# Patient Record
Sex: Female | Born: 1970
Health system: Southern US, Community
[De-identification: ages and names within clinical notes are randomized; demographics above are authoritative.]

## PROBLEM LIST (undated history)

## (undated) DIAGNOSIS — K219 Gastro-esophageal reflux disease without esophagitis: Secondary | ICD-10-CM

## (undated) DIAGNOSIS — R109 Unspecified abdominal pain: Secondary | ICD-10-CM

## (undated) DIAGNOSIS — I10 Essential (primary) hypertension: Secondary | ICD-10-CM

## (undated) DIAGNOSIS — G8929 Other chronic pain: Secondary | ICD-10-CM

## (undated) DIAGNOSIS — J45909 Unspecified asthma, uncomplicated: Secondary | ICD-10-CM

## (undated) DIAGNOSIS — G4700 Insomnia, unspecified: Secondary | ICD-10-CM

## (undated) DIAGNOSIS — E079 Disorder of thyroid, unspecified: Secondary | ICD-10-CM

## (undated) DIAGNOSIS — D649 Anemia, unspecified: Secondary | ICD-10-CM

## (undated) HISTORY — DX: Anemia, unspecified: D64.9

## (undated) HISTORY — PX: FOOT SURGERY: SHX648

## (undated) HISTORY — PX: TUBAL LIGATION: SHX77

## (undated) HISTORY — PX: GASTRIC BYPASS: SHX52

## (undated) HISTORY — PX: DILATION AND CURETTAGE OF UTERUS: SHX78

## (undated) HISTORY — PX: ANKLE SURGERY: SHX546

## (undated) HISTORY — DX: Insomnia, unspecified: G47.00

## (undated) HISTORY — DX: Gastro-esophageal reflux disease without esophagitis: K21.9

---

## 1998-01-15 ENCOUNTER — Other Ambulatory Visit: Admission: RE | Admit: 1998-01-15 | Discharge: 1998-01-15 | Payer: Self-pay | Admitting: Obstetrics

## 1998-06-30 ENCOUNTER — Emergency Department (HOSPITAL_COMMUNITY): Admission: EM | Admit: 1998-06-30 | Discharge: 1998-06-30 | Payer: Self-pay | Admitting: Emergency Medicine

## 1999-07-15 ENCOUNTER — Inpatient Hospital Stay (HOSPITAL_COMMUNITY): Admission: AD | Admit: 1999-07-15 | Discharge: 1999-07-15 | Payer: Self-pay | Admitting: *Deleted

## 1999-07-17 ENCOUNTER — Other Ambulatory Visit: Admission: RE | Admit: 1999-07-17 | Discharge: 1999-07-17 | Payer: Self-pay | Admitting: Obstetrics and Gynecology

## 1999-08-08 ENCOUNTER — Inpatient Hospital Stay (HOSPITAL_COMMUNITY): Admission: AD | Admit: 1999-08-08 | Discharge: 1999-08-08 | Payer: Self-pay | Admitting: Obstetrics and Gynecology

## 1999-09-13 ENCOUNTER — Inpatient Hospital Stay (HOSPITAL_COMMUNITY): Admission: AD | Admit: 1999-09-13 | Discharge: 1999-09-13 | Payer: Self-pay | Admitting: Obstetrics & Gynecology

## 1999-09-13 ENCOUNTER — Emergency Department (HOSPITAL_COMMUNITY): Admission: EM | Admit: 1999-09-13 | Discharge: 1999-09-13 | Payer: Self-pay | Admitting: Emergency Medicine

## 1999-09-14 ENCOUNTER — Encounter: Payer: Self-pay | Admitting: Emergency Medicine

## 1999-12-23 ENCOUNTER — Inpatient Hospital Stay (HOSPITAL_COMMUNITY): Admission: AD | Admit: 1999-12-23 | Discharge: 1999-12-23 | Payer: Self-pay | Admitting: Obstetrics and Gynecology

## 1999-12-24 ENCOUNTER — Inpatient Hospital Stay (HOSPITAL_COMMUNITY): Admission: AD | Admit: 1999-12-24 | Discharge: 1999-12-24 | Payer: Self-pay | Admitting: Obstetrics and Gynecology

## 2000-02-14 ENCOUNTER — Emergency Department (HOSPITAL_COMMUNITY): Admission: EM | Admit: 2000-02-14 | Discharge: 2000-02-14 | Payer: Self-pay | Admitting: Emergency Medicine

## 2000-02-28 ENCOUNTER — Inpatient Hospital Stay (HOSPITAL_COMMUNITY): Admission: AD | Admit: 2000-02-28 | Discharge: 2000-02-28 | Payer: Self-pay | Admitting: Obstetrics and Gynecology

## 2000-02-29 ENCOUNTER — Inpatient Hospital Stay (HOSPITAL_COMMUNITY): Admission: AD | Admit: 2000-02-29 | Discharge: 2000-03-02 | Payer: Self-pay | Admitting: Obstetrics and Gynecology

## 2000-07-23 ENCOUNTER — Emergency Department (HOSPITAL_COMMUNITY): Admission: EM | Admit: 2000-07-23 | Discharge: 2000-07-23 | Payer: Self-pay | Admitting: Emergency Medicine

## 2000-11-13 ENCOUNTER — Inpatient Hospital Stay (HOSPITAL_COMMUNITY): Admission: AD | Admit: 2000-11-13 | Discharge: 2000-11-13 | Payer: Self-pay | Admitting: Obstetrics and Gynecology

## 2000-11-19 ENCOUNTER — Encounter: Payer: Self-pay | Admitting: Obstetrics and Gynecology

## 2000-11-19 ENCOUNTER — Observation Stay (HOSPITAL_COMMUNITY): Admission: AD | Admit: 2000-11-19 | Discharge: 2000-11-20 | Payer: Self-pay | Admitting: Obstetrics and Gynecology

## 2001-01-02 ENCOUNTER — Inpatient Hospital Stay (HOSPITAL_COMMUNITY): Admission: AD | Admit: 2001-01-02 | Discharge: 2001-01-02 | Payer: Self-pay | Admitting: Obstetrics and Gynecology

## 2001-01-07 ENCOUNTER — Inpatient Hospital Stay (HOSPITAL_COMMUNITY): Admission: AD | Admit: 2001-01-07 | Discharge: 2001-01-07 | Payer: Self-pay | Admitting: Obstetrics and Gynecology

## 2001-01-10 ENCOUNTER — Inpatient Hospital Stay (HOSPITAL_COMMUNITY): Admission: AD | Admit: 2001-01-10 | Discharge: 2001-01-10 | Payer: Self-pay | Admitting: Obstetrics & Gynecology

## 2001-01-30 ENCOUNTER — Inpatient Hospital Stay (HOSPITAL_COMMUNITY): Admission: AD | Admit: 2001-01-30 | Discharge: 2001-01-30 | Payer: Self-pay | Admitting: Obstetrics and Gynecology

## 2001-02-19 ENCOUNTER — Inpatient Hospital Stay (HOSPITAL_COMMUNITY): Admission: AD | Admit: 2001-02-19 | Discharge: 2001-02-19 | Payer: Self-pay | Admitting: Obstetrics & Gynecology

## 2001-02-21 ENCOUNTER — Inpatient Hospital Stay (HOSPITAL_COMMUNITY): Admission: AD | Admit: 2001-02-21 | Discharge: 2001-02-21 | Payer: Self-pay | Admitting: Obstetrics and Gynecology

## 2001-02-28 ENCOUNTER — Encounter (HOSPITAL_COMMUNITY): Admission: RE | Admit: 2001-02-28 | Discharge: 2001-03-10 | Payer: Self-pay | Admitting: Obstetrics and Gynecology

## 2001-03-01 ENCOUNTER — Encounter: Payer: Self-pay | Admitting: Obstetrics and Gynecology

## 2001-03-03 ENCOUNTER — Inpatient Hospital Stay (HOSPITAL_COMMUNITY): Admission: AD | Admit: 2001-03-03 | Discharge: 2001-03-03 | Payer: Self-pay | Admitting: Obstetrics and Gynecology

## 2001-03-04 ENCOUNTER — Inpatient Hospital Stay (HOSPITAL_COMMUNITY): Admission: AD | Admit: 2001-03-04 | Discharge: 2001-03-04 | Payer: Self-pay | Admitting: Obstetrics and Gynecology

## 2001-03-06 ENCOUNTER — Inpatient Hospital Stay (HOSPITAL_COMMUNITY): Admission: AD | Admit: 2001-03-06 | Discharge: 2001-03-06 | Payer: Self-pay | Admitting: Obstetrics & Gynecology

## 2001-03-09 ENCOUNTER — Inpatient Hospital Stay (HOSPITAL_COMMUNITY): Admission: AD | Admit: 2001-03-09 | Discharge: 2001-03-11 | Payer: Self-pay | Admitting: Obstetrics and Gynecology

## 2001-04-06 ENCOUNTER — Other Ambulatory Visit: Admission: RE | Admit: 2001-04-06 | Discharge: 2001-04-06 | Payer: Self-pay | Admitting: Obstetrics and Gynecology

## 2001-04-20 ENCOUNTER — Ambulatory Visit (HOSPITAL_COMMUNITY): Admission: RE | Admit: 2001-04-20 | Discharge: 2001-04-27 | Payer: Self-pay | Admitting: Obstetrics and Gynecology

## 2001-06-09 ENCOUNTER — Ambulatory Visit (HOSPITAL_COMMUNITY): Admission: RE | Admit: 2001-06-09 | Discharge: 2001-06-09 | Payer: Self-pay | Admitting: *Deleted

## 2001-06-09 ENCOUNTER — Encounter: Payer: Self-pay | Admitting: *Deleted

## 2001-06-30 ENCOUNTER — Other Ambulatory Visit: Admission: RE | Admit: 2001-06-30 | Discharge: 2001-06-30 | Payer: Self-pay | Admitting: Obstetrics and Gynecology

## 2001-10-01 ENCOUNTER — Emergency Department (HOSPITAL_COMMUNITY): Admission: EM | Admit: 2001-10-01 | Discharge: 2001-10-01 | Payer: Self-pay | Admitting: Emergency Medicine

## 2001-10-02 ENCOUNTER — Ambulatory Visit (HOSPITAL_COMMUNITY): Admission: RE | Admit: 2001-10-02 | Discharge: 2001-10-02 | Payer: Self-pay | Admitting: Emergency Medicine

## 2001-10-02 ENCOUNTER — Encounter: Payer: Self-pay | Admitting: Emergency Medicine

## 2001-10-24 ENCOUNTER — Emergency Department (HOSPITAL_COMMUNITY): Admission: EM | Admit: 2001-10-24 | Discharge: 2001-10-24 | Payer: Self-pay | Admitting: Emergency Medicine

## 2001-10-26 ENCOUNTER — Encounter: Admission: RE | Admit: 2001-10-26 | Discharge: 2001-10-26 | Payer: Self-pay | Admitting: Obstetrics and Gynecology

## 2001-10-26 ENCOUNTER — Encounter: Payer: Self-pay | Admitting: Obstetrics and Gynecology

## 2001-12-29 ENCOUNTER — Encounter: Admission: RE | Admit: 2001-12-29 | Discharge: 2002-03-29 | Payer: Self-pay | Admitting: Internal Medicine

## 2002-09-02 ENCOUNTER — Encounter: Payer: Self-pay | Admitting: Emergency Medicine

## 2002-09-02 ENCOUNTER — Inpatient Hospital Stay (HOSPITAL_COMMUNITY): Admission: EM | Admit: 2002-09-02 | Discharge: 2002-09-03 | Payer: Self-pay | Admitting: Emergency Medicine

## 2002-09-02 ENCOUNTER — Encounter: Payer: Self-pay | Admitting: Orthopedic Surgery

## 2002-09-04 ENCOUNTER — Encounter: Payer: Self-pay | Admitting: Emergency Medicine

## 2002-09-04 ENCOUNTER — Emergency Department (HOSPITAL_COMMUNITY): Admission: EM | Admit: 2002-09-04 | Discharge: 2002-09-04 | Payer: Self-pay | Admitting: Emergency Medicine

## 2002-09-06 ENCOUNTER — Ambulatory Visit (HOSPITAL_COMMUNITY): Admission: RE | Admit: 2002-09-06 | Discharge: 2002-09-06 | Payer: Self-pay | Admitting: Anesthesiology

## 2003-03-02 ENCOUNTER — Emergency Department (HOSPITAL_COMMUNITY): Admission: EM | Admit: 2003-03-02 | Discharge: 2003-03-03 | Payer: Self-pay

## 2003-07-30 ENCOUNTER — Emergency Department (HOSPITAL_COMMUNITY): Admission: EM | Admit: 2003-07-30 | Discharge: 2003-07-31 | Payer: Self-pay | Admitting: Emergency Medicine

## 2003-11-01 ENCOUNTER — Emergency Department (HOSPITAL_COMMUNITY): Admission: EM | Admit: 2003-11-01 | Discharge: 2003-11-01 | Payer: Self-pay | Admitting: Emergency Medicine

## 2004-07-25 ENCOUNTER — Emergency Department (HOSPITAL_COMMUNITY): Admission: EM | Admit: 2004-07-25 | Discharge: 2004-07-25 | Payer: Self-pay

## 2004-08-01 ENCOUNTER — Emergency Department (HOSPITAL_COMMUNITY): Admission: EM | Admit: 2004-08-01 | Discharge: 2004-08-01 | Payer: Self-pay | Admitting: Emergency Medicine

## 2004-10-11 ENCOUNTER — Inpatient Hospital Stay (HOSPITAL_COMMUNITY): Admission: AD | Admit: 2004-10-11 | Discharge: 2004-10-11 | Payer: Self-pay | Admitting: Obstetrics

## 2004-10-13 ENCOUNTER — Ambulatory Visit (HOSPITAL_COMMUNITY): Admission: RE | Admit: 2004-10-13 | Discharge: 2004-10-13 | Payer: Self-pay | Admitting: Obstetrics

## 2004-10-13 ENCOUNTER — Encounter (INDEPENDENT_AMBULATORY_CARE_PROVIDER_SITE_OTHER): Payer: Self-pay | Admitting: *Deleted

## 2004-11-15 ENCOUNTER — Inpatient Hospital Stay (HOSPITAL_COMMUNITY): Admission: AD | Admit: 2004-11-15 | Discharge: 2004-11-15 | Payer: Self-pay | Admitting: Obstetrics

## 2005-01-23 ENCOUNTER — Emergency Department (HOSPITAL_COMMUNITY): Admission: EM | Admit: 2005-01-23 | Discharge: 2005-01-23 | Payer: Self-pay | Admitting: Emergency Medicine

## 2005-03-29 ENCOUNTER — Inpatient Hospital Stay (HOSPITAL_COMMUNITY): Admission: AD | Admit: 2005-03-29 | Discharge: 2005-03-29 | Payer: Self-pay | Admitting: Obstetrics

## 2005-04-23 ENCOUNTER — Inpatient Hospital Stay (HOSPITAL_COMMUNITY): Admission: AD | Admit: 2005-04-23 | Discharge: 2005-04-23 | Payer: Self-pay | Admitting: Obstetrics & Gynecology

## 2005-05-19 ENCOUNTER — Inpatient Hospital Stay (HOSPITAL_COMMUNITY): Admission: AD | Admit: 2005-05-19 | Discharge: 2005-05-19 | Payer: Self-pay | Admitting: Obstetrics

## 2005-08-07 ENCOUNTER — Inpatient Hospital Stay (HOSPITAL_COMMUNITY): Admission: AD | Admit: 2005-08-07 | Discharge: 2005-08-08 | Payer: Self-pay | Admitting: Obstetrics

## 2005-09-18 ENCOUNTER — Inpatient Hospital Stay (HOSPITAL_COMMUNITY): Admission: AD | Admit: 2005-09-18 | Discharge: 2005-09-19 | Payer: Self-pay | Admitting: Obstetrics & Gynecology

## 2005-10-29 ENCOUNTER — Inpatient Hospital Stay (HOSPITAL_COMMUNITY): Admission: AD | Admit: 2005-10-29 | Discharge: 2005-10-29 | Payer: Self-pay | Admitting: Obstetrics

## 2005-11-19 ENCOUNTER — Inpatient Hospital Stay (HOSPITAL_COMMUNITY): Admission: RE | Admit: 2005-11-19 | Discharge: 2005-11-23 | Payer: Self-pay | Admitting: Obstetrics & Gynecology

## 2005-11-27 ENCOUNTER — Inpatient Hospital Stay (HOSPITAL_COMMUNITY): Admission: AD | Admit: 2005-11-27 | Discharge: 2005-12-01 | Payer: Self-pay | Admitting: Obstetrics & Gynecology

## 2006-05-12 ENCOUNTER — Ambulatory Visit (HOSPITAL_COMMUNITY): Admission: RE | Admit: 2006-05-12 | Discharge: 2006-05-12 | Payer: Self-pay | Admitting: Obstetrics & Gynecology

## 2006-09-03 ENCOUNTER — Emergency Department (HOSPITAL_COMMUNITY): Admission: EM | Admit: 2006-09-03 | Discharge: 2006-09-03 | Payer: Self-pay | Admitting: Emergency Medicine

## 2006-09-17 ENCOUNTER — Emergency Department (HOSPITAL_COMMUNITY): Admission: EM | Admit: 2006-09-17 | Discharge: 2006-09-17 | Payer: Self-pay | Admitting: Family Medicine

## 2007-03-22 ENCOUNTER — Observation Stay (HOSPITAL_COMMUNITY): Admission: AD | Admit: 2007-03-22 | Discharge: 2007-03-22 | Payer: Self-pay | Admitting: Obstetrics & Gynecology

## 2007-05-08 ENCOUNTER — Inpatient Hospital Stay (HOSPITAL_COMMUNITY): Admission: AD | Admit: 2007-05-08 | Discharge: 2007-05-08 | Payer: Self-pay | Admitting: Obstetrics

## 2007-06-05 ENCOUNTER — Inpatient Hospital Stay (HOSPITAL_COMMUNITY): Admission: AD | Admit: 2007-06-05 | Discharge: 2007-06-05 | Payer: Self-pay | Admitting: Obstetrics

## 2007-07-07 ENCOUNTER — Inpatient Hospital Stay (HOSPITAL_COMMUNITY): Admission: AD | Admit: 2007-07-07 | Discharge: 2007-07-07 | Payer: Self-pay | Admitting: Obstetrics

## 2007-07-08 ENCOUNTER — Inpatient Hospital Stay (HOSPITAL_COMMUNITY): Admission: AD | Admit: 2007-07-08 | Discharge: 2007-07-08 | Payer: Self-pay | Admitting: Obstetrics

## 2007-08-14 ENCOUNTER — Inpatient Hospital Stay (HOSPITAL_COMMUNITY): Admission: AD | Admit: 2007-08-14 | Discharge: 2007-08-14 | Payer: Self-pay | Admitting: Obstetrics

## 2007-08-17 ENCOUNTER — Ambulatory Visit (HOSPITAL_COMMUNITY): Admission: RE | Admit: 2007-08-17 | Discharge: 2007-08-17 | Payer: Self-pay | Admitting: Obstetrics & Gynecology

## 2007-08-17 ENCOUNTER — Other Ambulatory Visit: Payer: Self-pay | Admitting: Obstetrics & Gynecology

## 2007-08-18 ENCOUNTER — Encounter: Payer: Self-pay | Admitting: Obstetrics & Gynecology

## 2007-08-18 ENCOUNTER — Inpatient Hospital Stay (HOSPITAL_COMMUNITY): Admission: RE | Admit: 2007-08-18 | Discharge: 2007-08-21 | Payer: Self-pay | Admitting: Obstetrics & Gynecology

## 2007-12-20 ENCOUNTER — Encounter: Admission: RE | Admit: 2007-12-20 | Discharge: 2008-03-19 | Payer: Self-pay | Admitting: Obstetrics & Gynecology

## 2008-02-07 ENCOUNTER — Emergency Department (HOSPITAL_BASED_OUTPATIENT_CLINIC_OR_DEPARTMENT_OTHER): Admission: EM | Admit: 2008-02-07 | Discharge: 2008-02-07 | Payer: Self-pay | Admitting: Emergency Medicine

## 2008-08-21 ENCOUNTER — Emergency Department (HOSPITAL_BASED_OUTPATIENT_CLINIC_OR_DEPARTMENT_OTHER): Admission: EM | Admit: 2008-08-21 | Discharge: 2008-08-21 | Payer: Self-pay | Admitting: Emergency Medicine

## 2008-09-10 ENCOUNTER — Ambulatory Visit (HOSPITAL_COMMUNITY): Admission: RE | Admit: 2008-09-10 | Discharge: 2008-09-10 | Payer: Self-pay | Admitting: Obstetrics & Gynecology

## 2009-01-21 ENCOUNTER — Inpatient Hospital Stay (HOSPITAL_COMMUNITY): Admission: AD | Admit: 2009-01-21 | Discharge: 2009-01-21 | Payer: Self-pay | Admitting: Obstetrics

## 2009-01-21 ENCOUNTER — Ambulatory Visit: Payer: Self-pay | Admitting: Family

## 2009-02-26 ENCOUNTER — Ambulatory Visit (HOSPITAL_COMMUNITY): Admission: RE | Admit: 2009-02-26 | Discharge: 2009-02-26 | Payer: Self-pay | Admitting: Obstetrics & Gynecology

## 2009-03-05 ENCOUNTER — Ambulatory Visit (HOSPITAL_COMMUNITY): Admission: RE | Admit: 2009-03-05 | Discharge: 2009-03-05 | Payer: Self-pay | Admitting: Obstetrics & Gynecology

## 2009-03-12 ENCOUNTER — Ambulatory Visit (HOSPITAL_COMMUNITY): Admission: RE | Admit: 2009-03-12 | Discharge: 2009-03-12 | Payer: Self-pay | Admitting: Obstetrics & Gynecology

## 2009-03-17 ENCOUNTER — Inpatient Hospital Stay (HOSPITAL_COMMUNITY): Admission: AD | Admit: 2009-03-17 | Discharge: 2009-03-17 | Payer: Self-pay | Admitting: Obstetrics

## 2009-03-20 ENCOUNTER — Inpatient Hospital Stay (HOSPITAL_COMMUNITY): Admission: AD | Admit: 2009-03-20 | Discharge: 2009-03-22 | Payer: Self-pay | Admitting: Obstetrics

## 2009-03-21 ENCOUNTER — Encounter: Payer: Self-pay | Admitting: Obstetrics & Gynecology

## 2009-03-27 ENCOUNTER — Ambulatory Visit (HOSPITAL_COMMUNITY): Admission: RE | Admit: 2009-03-27 | Discharge: 2009-03-27 | Payer: Self-pay | Admitting: Obstetrics & Gynecology

## 2009-03-31 ENCOUNTER — Ambulatory Visit (HOSPITAL_COMMUNITY): Admission: RE | Admit: 2009-03-31 | Discharge: 2009-03-31 | Payer: Self-pay | Admitting: Obstetrics & Gynecology

## 2009-04-01 ENCOUNTER — Inpatient Hospital Stay (HOSPITAL_COMMUNITY): Admission: RE | Admit: 2009-04-01 | Discharge: 2009-04-04 | Payer: Self-pay | Admitting: Obstetrics & Gynecology

## 2009-04-01 ENCOUNTER — Encounter: Payer: Self-pay | Admitting: Obstetrics & Gynecology

## 2009-09-25 ENCOUNTER — Emergency Department (HOSPITAL_BASED_OUTPATIENT_CLINIC_OR_DEPARTMENT_OTHER): Admission: EM | Admit: 2009-09-25 | Discharge: 2009-09-25 | Payer: Self-pay | Admitting: Emergency Medicine

## 2009-09-30 ENCOUNTER — Emergency Department (HOSPITAL_BASED_OUTPATIENT_CLINIC_OR_DEPARTMENT_OTHER): Admission: EM | Admit: 2009-09-30 | Discharge: 2009-09-30 | Payer: Self-pay | Admitting: Emergency Medicine

## 2010-02-12 IMAGING — US US FETAL BPP W/O NONSTRESS
1 series · 7 of 7 positions shown · non-contrast
Comparison: none

OBSTETRICAL ULTRASOUND:
 This ultrasound exam was performed in the [HOSPITAL] Ultrasound Department.  The OB US report was generated in the AS system, and faxed to the ordering physician.  This report is also available in [REDACTED] PACS.

[Series 1: us fetal bpp w/o nonstress · non-contrast · 0.27mm/px · 7 acquisitions, 7 frames shown]
[im 1/7]
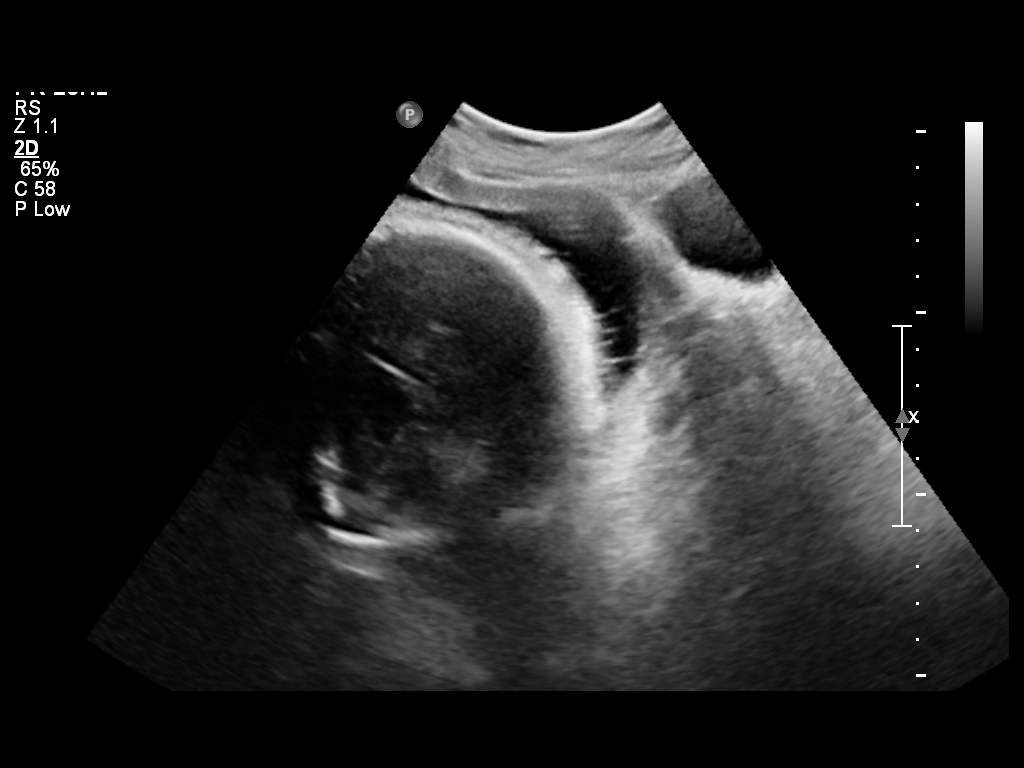
[im 2/7]
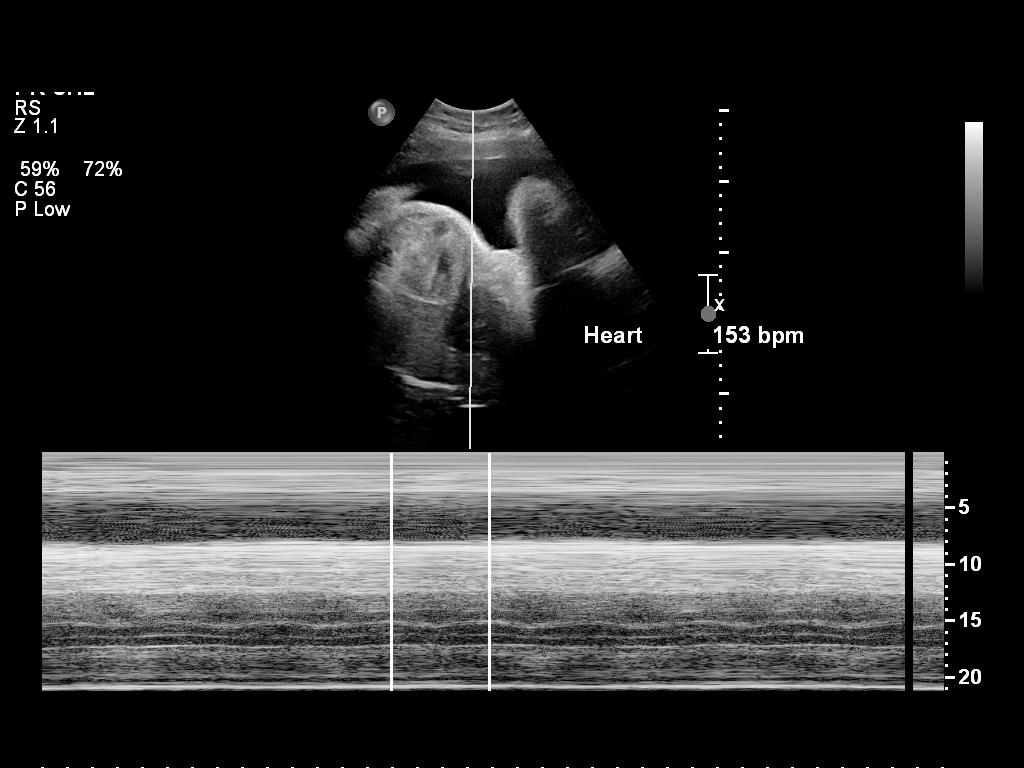
[im 3/7]
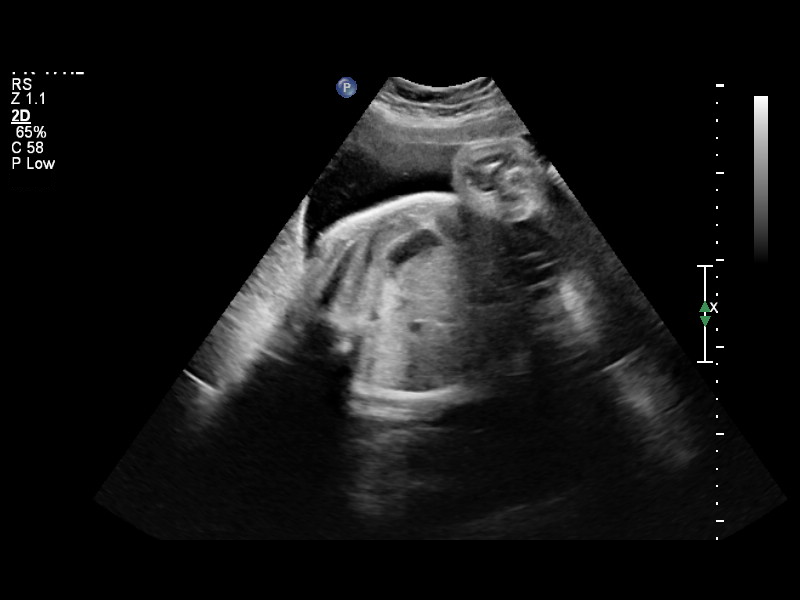
[im 4/7]
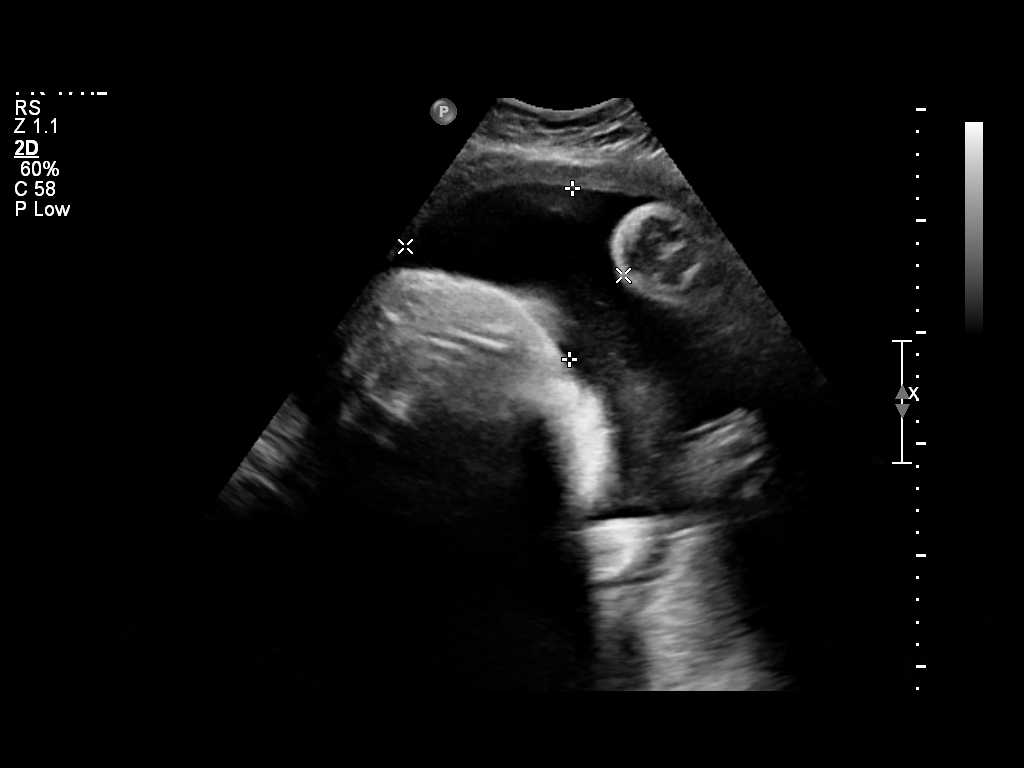
[im 5/7]
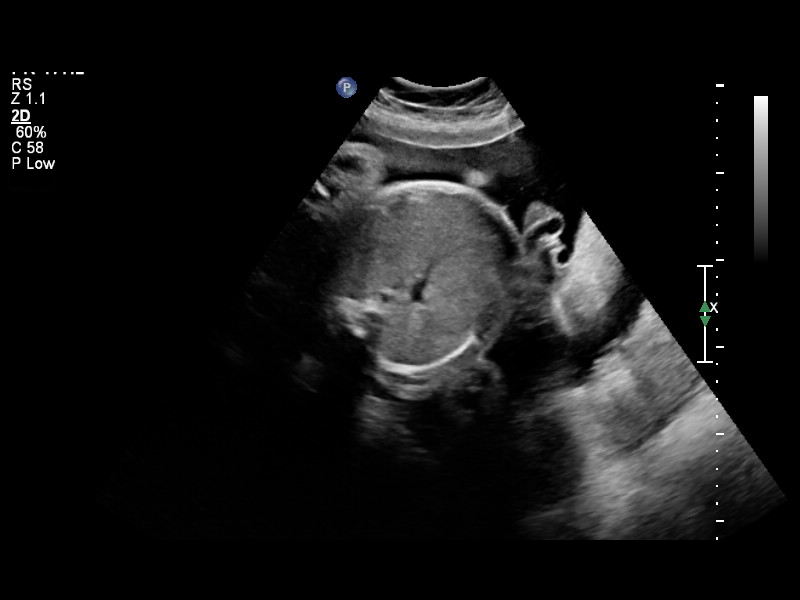
[im 6/7]
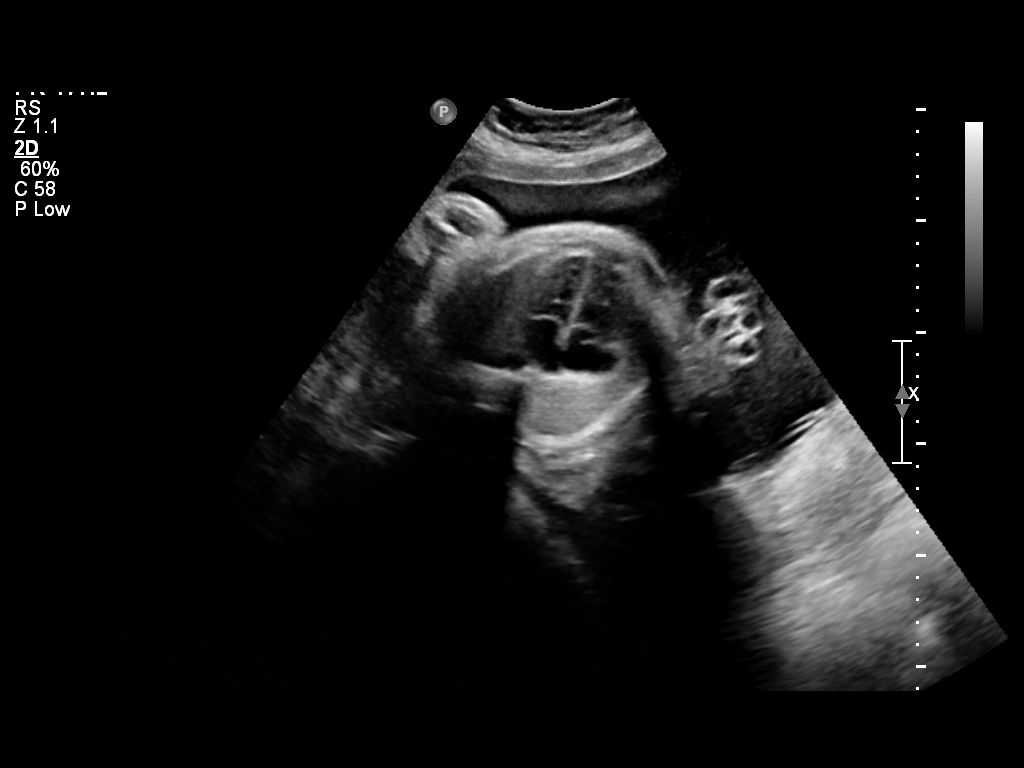
[im 7/7]
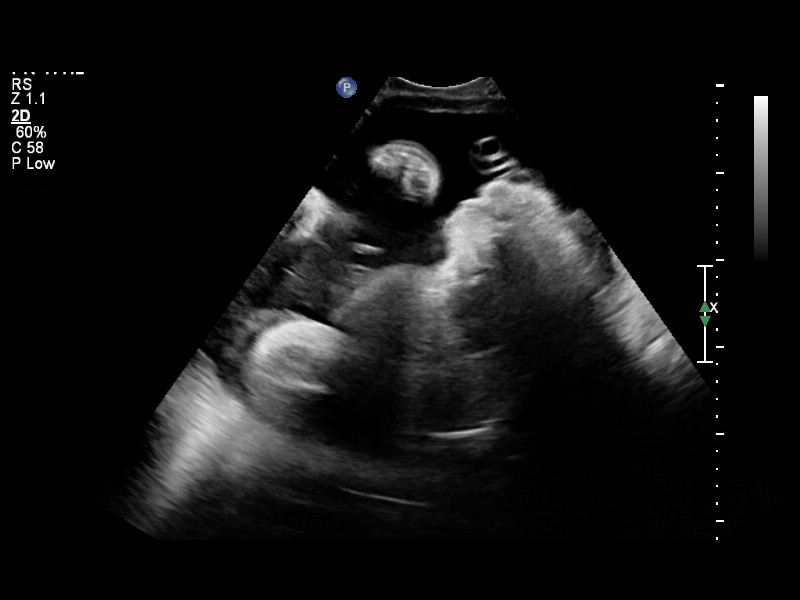

[7 of 7 positions shown; findings below may reference images not displayed]

IMPRESSION: See AS Obstetric US report.

## 2010-02-16 IMAGING — US US AMNIOCENTESIS
1 series · 4 of 4 positions shown · non-contrast
Comparison: none

CLINICAL DATA: ULTRASOUND-GUIDED AMNIOCENTESIS:
 Ultrasound was utilized to perform amniocentesis by the requesting physician.

[Series 1: us amniocentesis · 4 of 4 slices shown]
[im 1/4]
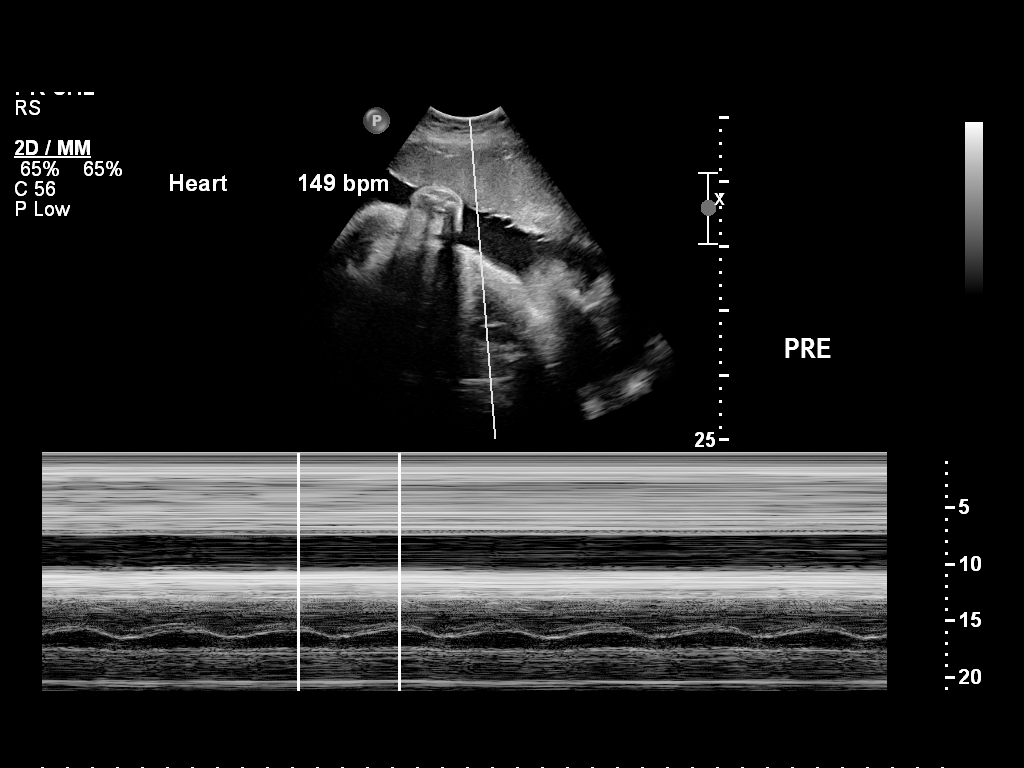
[im 2/4]
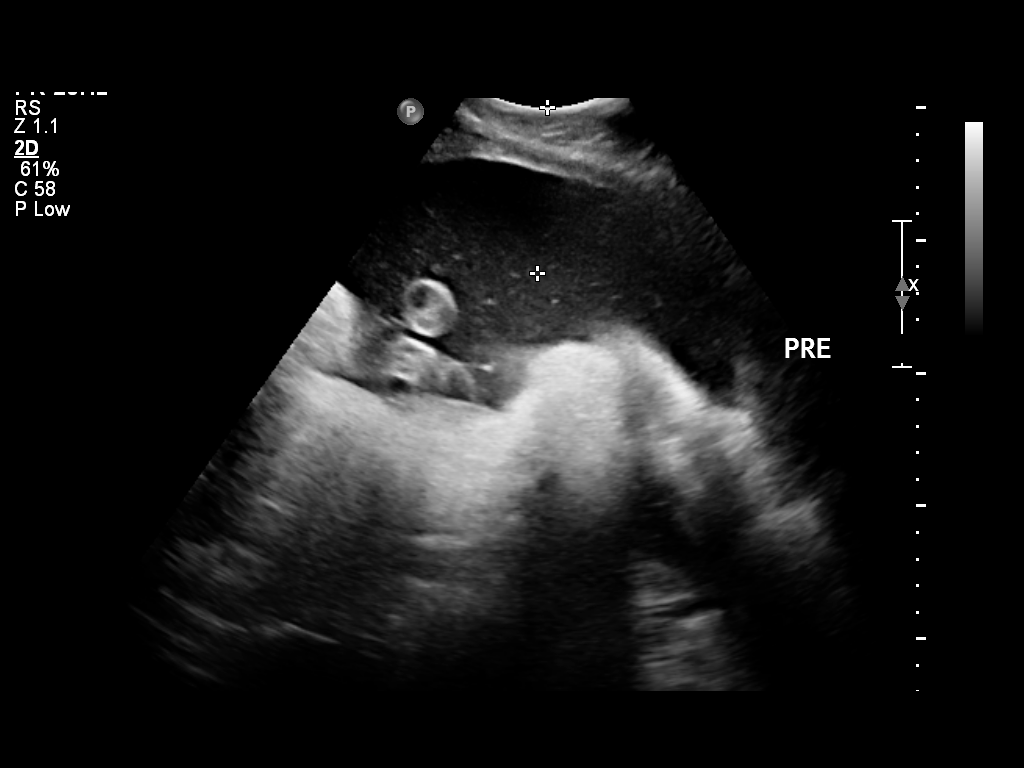
[im 3/4]
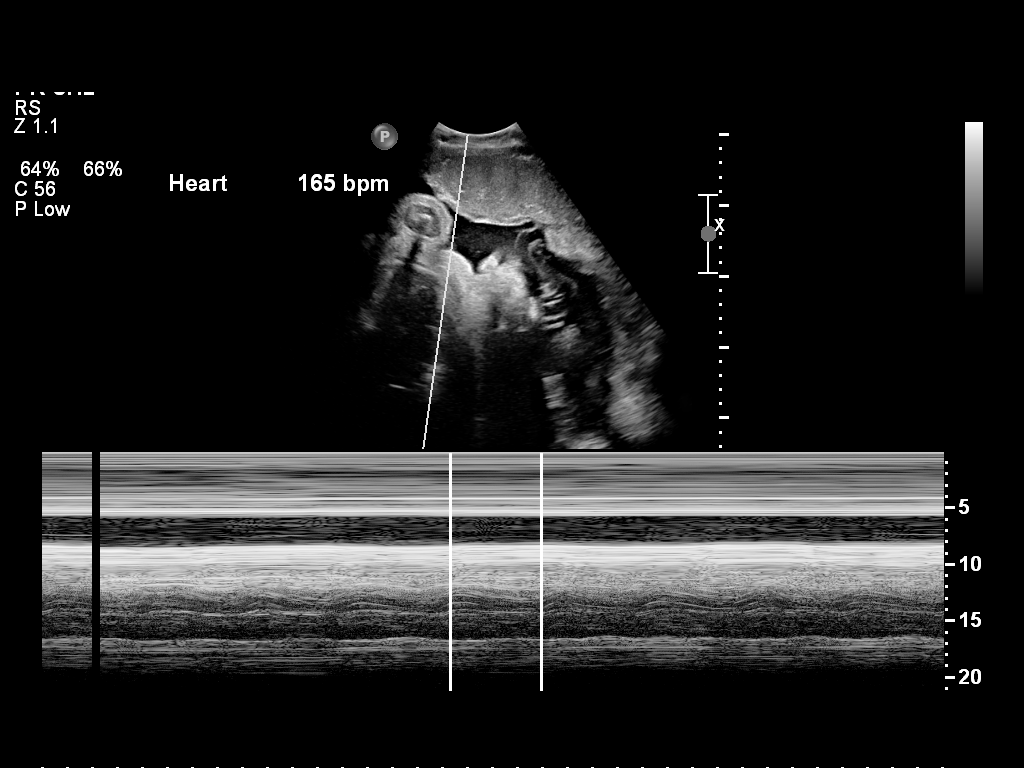
[im 4/4]
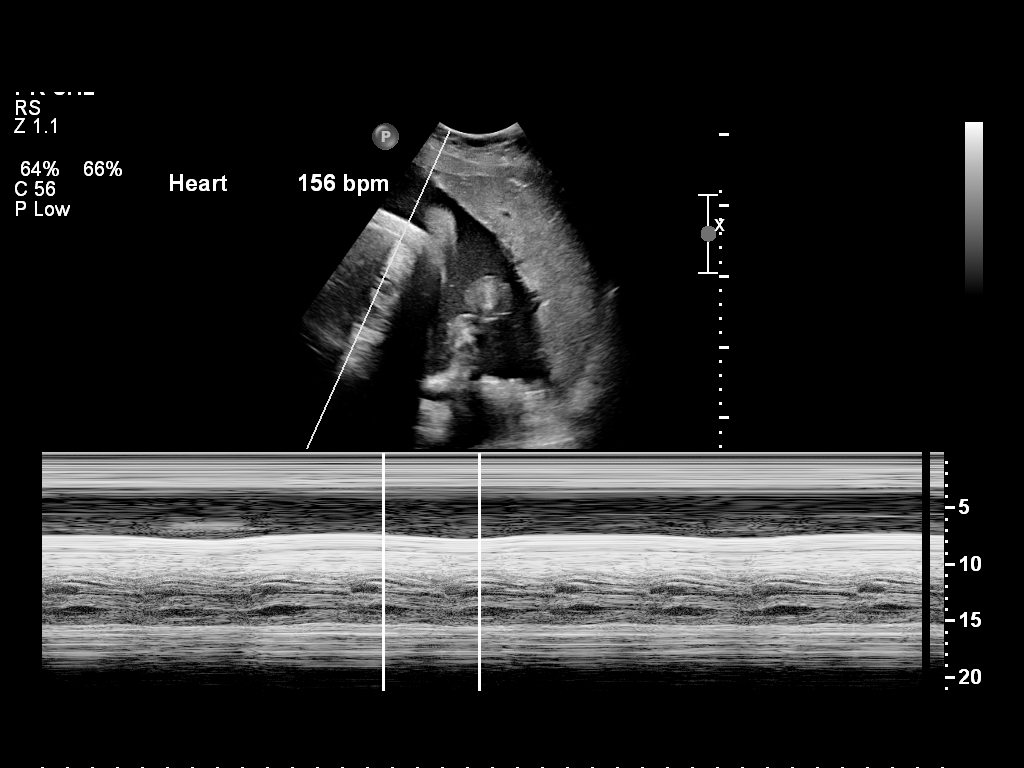

[4 of 4 positions shown; findings below may reference images not displayed]

IMPRESSION: No radiographic interpretation.

## 2010-06-20 ENCOUNTER — Emergency Department (HOSPITAL_BASED_OUTPATIENT_CLINIC_OR_DEPARTMENT_OTHER): Admission: EM | Admit: 2010-06-20 | Discharge: 2010-06-20 | Payer: Self-pay | Admitting: Emergency Medicine

## 2010-08-29 ENCOUNTER — Encounter: Payer: Self-pay | Admitting: Obstetrics & Gynecology

## 2010-08-29 ENCOUNTER — Encounter: Payer: Self-pay | Admitting: Obstetrics

## 2010-08-31 ENCOUNTER — Encounter: Payer: Self-pay | Admitting: Obstetrics

## 2010-10-20 LAB — CBC
HCT: 23.7 % — ABNORMAL LOW (ref 36.0–46.0)
Platelets: 256 10*3/uL (ref 150–400)
RBC: 3.29 MIL/uL — ABNORMAL LOW (ref 3.87–5.11)
RDW: 18.1 % — ABNORMAL HIGH (ref 11.5–15.5)
WBC: 7.5 10*3/uL (ref 4.0–10.5)

## 2010-10-20 LAB — DIFFERENTIAL
Basophils Absolute: 0 10*3/uL (ref 0.0–0.1)
Lymphocytes Relative: 53 % — ABNORMAL HIGH (ref 12–46)
Lymphs Abs: 3.9 10*3/uL (ref 0.7–4.0)
Neutro Abs: 2.7 10*3/uL (ref 1.7–7.7)

## 2010-10-20 LAB — BASIC METABOLIC PANEL
BUN: 16 mg/dL (ref 6–23)
GFR calc Af Amer: >60 mL/min (ref >60)
GFR calc non Af Amer: >60 mL/min (ref >60)
Potassium: 3.7 mEq/L (ref 3.5–5.1)

## 2010-10-20 LAB — D-DIMER, QUANTITATIVE: D-Dimer, Quant: 0.36 ug/mL-FEU (ref 0.00–0.48)

## 2010-11-01 ENCOUNTER — Emergency Department (HOSPITAL_BASED_OUTPATIENT_CLINIC_OR_DEPARTMENT_OTHER)
Admission: EM | Admit: 2010-11-01 | Discharge: 2010-11-01 | Disposition: A | Discharge status: Home / Self Care | Payer: BC Managed Care – PPO | Attending: Emergency Medicine | Admitting: Emergency Medicine

## 2010-11-01 DIAGNOSIS — Z79899 Other long term (current) drug therapy: Secondary | ICD-10-CM | POA: Insufficient documentation

## 2010-11-01 DIAGNOSIS — Y92009 Unspecified place in unspecified non-institutional (private) residence as the place of occurrence of the external cause: Secondary | ICD-10-CM | POA: Insufficient documentation

## 2010-11-01 DIAGNOSIS — J45909 Unspecified asthma, uncomplicated: Secondary | ICD-10-CM | POA: Insufficient documentation

## 2010-11-01 DIAGNOSIS — T1500XA Foreign body in cornea, unspecified eye, initial encounter: Secondary | ICD-10-CM | POA: Insufficient documentation

## 2010-11-01 DIAGNOSIS — I10 Essential (primary) hypertension: Secondary | ICD-10-CM | POA: Insufficient documentation

## 2010-11-01 DIAGNOSIS — IMO0002 Reserved for concepts with insufficient information to code with codable children: Secondary | ICD-10-CM | POA: Insufficient documentation

## 2010-11-01 DIAGNOSIS — H5710 Ocular pain, unspecified eye: Secondary | ICD-10-CM | POA: Insufficient documentation

## 2010-11-14 LAB — COMPREHENSIVE METABOLIC PANEL
ALT: 8 U/L (ref 0–35)
ALT: <8 U/L (ref 0–35)
AST: 19 U/L (ref 0–37)
AST: 16 U/L (ref 0–37)
Albumin: 2.2 g/dL — ABNORMAL LOW (ref 3.5–5.2)
Albumin: 2.1 g/dL — ABNORMAL LOW (ref 3.5–5.2)
Alkaline Phosphatase: 105 U/L (ref 39–117)
Alkaline Phosphatase: 102 U/L (ref 39–117)
BUN: 10 mg/dL (ref 6–23)
BUN: 10 mg/dL (ref 6–23)
CO2: 23 mEq/L (ref 19–32)
CO2: 23 mEq/L (ref 19–32)
Calcium: 8.7 mg/dL (ref 8.4–10.5)
Calcium: 8.4 mg/dL (ref 8.4–10.5)
Chloride: 106 mEq/L (ref 96–112)
Chloride: 105 mEq/L (ref 96–112)
Creatinine, Ser: 0.51 mg/dL (ref 0.4–1.2)
Creatinine, Ser: 0.54 mg/dL (ref 0.4–1.2)
GFR calc Af Amer: >60 mL/min (ref >60)
GFR calc Af Amer: >60 mL/min (ref >60)
GFR calc non Af Amer: >60 mL/min (ref >60)
GFR calc non Af Amer: >60 mL/min (ref >60)
Glucose, Bld: 111 mg/dL — ABNORMAL HIGH (ref 70–99)
Glucose, Bld: 95 mg/dL (ref 70–99)
Potassium: 3.6 mEq/L (ref 3.5–5.1)
Potassium: 3.9 mEq/L (ref 3.5–5.1)
Sodium: 136 mEq/L (ref 135–145)
Sodium: 133 mEq/L — ABNORMAL LOW (ref 135–145)
Total Bilirubin: 0.4 mg/dL (ref 0.3–1.2)
Total Bilirubin: 0.5 mg/dL (ref 0.3–1.2)
Total Protein: 6.4 g/dL (ref 6.0–8.3)
Total Protein: 5.9 g/dL — ABNORMAL LOW (ref 6.0–8.3)

## 2010-11-14 LAB — CBC
HCT: 21 % — ABNORMAL LOW (ref 36.0–46.0)
HCT: 26.3 % — ABNORMAL LOW (ref 36.0–46.0)
Hemoglobin: 7 g/dL — CL (ref 12.0–15.0)
Hemoglobin: 9.1 g/dL — ABNORMAL LOW (ref 12.0–15.0)
Hemoglobin: 8.7 g/dL — ABNORMAL LOW (ref 12.0–15.0)
MCHC: 33.5 g/dL (ref 30.0–36.0)
MCHC: 33 g/dL (ref 30.0–36.0)
MCV: 78.9 fL (ref 78.0–100.0)
MCV: 78.7 fL (ref 78.0–100.0)
Platelets: 179 10*3/uL (ref 150–400)
Platelets: 240 10*3/uL (ref 150–400)
RBC: 2.66 MIL/uL — ABNORMAL LOW (ref 3.87–5.11)
RBC: 3.43 MIL/uL — ABNORMAL LOW (ref 3.87–5.11)
RBC: 3.34 MIL/uL — ABNORMAL LOW (ref 3.87–5.11)
RDW: 15.7 % — ABNORMAL HIGH (ref 11.5–15.5)
RDW: 15.9 % — ABNORMAL HIGH (ref 11.5–15.5)
WBC: 7.1 10*3/uL (ref 4.0–10.5)
WBC: 6.2 10*3/uL (ref 4.0–10.5)

## 2010-11-14 LAB — GLUCOSE, CAPILLARY: Glucose-Capillary: 106 mg/dL — ABNORMAL HIGH (ref 70–99)

## 2010-11-14 LAB — LSPG (L/S RATIO WITH PG)-AMNIO FLUID

## 2010-11-14 LAB — URINALYSIS, ROUTINE W REFLEX MICROSCOPIC
Bilirubin Urine: NEGATIVE
Leukocytes, UA: NEGATIVE
Nitrite: NEGATIVE
Specific Gravity, Urine: 1.02 (ref 1.005–1.030)
pH: 6 (ref 5.0–8.0)

## 2010-11-14 LAB — LACTATE DEHYDROGENASE: LDH: 98 U/L (ref 94–250)

## 2010-11-14 LAB — URINE MICROSCOPIC-ADD ON

## 2010-11-14 LAB — RPR: RPR Ser Ql: NONREACTIVE

## 2010-11-14 LAB — URIC ACID: Uric Acid, Serum: 4.2 mg/dL (ref 2.4–7.0)

## 2010-11-15 LAB — CBC
MCHC: 33.7 g/dL (ref 30.0–36.0)
RDW: 15.7 % — ABNORMAL HIGH (ref 11.5–15.5)

## 2010-11-15 LAB — GLUCOSE, CAPILLARY
Glucose-Capillary: 102 mg/dL — ABNORMAL HIGH (ref 70–99)
Glucose-Capillary: 103 mg/dL — ABNORMAL HIGH (ref 70–99)
Glucose-Capillary: 114 mg/dL — ABNORMAL HIGH (ref 70–99)
Glucose-Capillary: 116 mg/dL — ABNORMAL HIGH (ref 70–99)
Glucose-Capillary: 121 mg/dL — ABNORMAL HIGH (ref 70–99)

## 2010-11-15 LAB — COMPREHENSIVE METABOLIC PANEL
ALT: <8 U/L (ref 0–35)
AST: 17 U/L (ref 0–37)
Albumin: 2.2 g/dL — ABNORMAL LOW (ref 3.5–5.2)
Alkaline Phosphatase: 97 U/L (ref 39–117)
BUN: 8 mg/dL (ref 6–23)
CO2: 20 mEq/L (ref 19–32)
Calcium: 8.4 mg/dL (ref 8.4–10.5)
Chloride: 106 mEq/L (ref 96–112)
Creatinine, Ser: 0.55 mg/dL (ref 0.4–1.2)
GFR calc Af Amer: >60 mL/min (ref >60)
GFR calc non Af Amer: >60 mL/min (ref >60)
Glucose, Bld: 139 mg/dL — ABNORMAL HIGH (ref 70–99)
Potassium: 3.8 mEq/L (ref 3.5–5.1)
Sodium: 134 mEq/L — ABNORMAL LOW (ref 135–145)
Total Bilirubin: 0.4 mg/dL (ref 0.3–1.2)
Total Protein: 6.1 g/dL (ref 6.0–8.3)

## 2010-11-15 LAB — PROTEIN, URINE, 24 HOUR
Collection Interval-UPROT: 24 hours
Protein, 24H Urine: 78 mg/d (ref 50–100)
Protein, Urine: 4 mg/dL

## 2010-11-15 LAB — CREATININE CLEARANCE, URINE, 24 HOUR: Creatinine Clearance: 257 mL/min — ABNORMAL HIGH (ref 75–115)

## 2010-11-15 LAB — URIC ACID: Uric Acid, Serum: 4.4 mg/dL (ref 2.4–7.0)

## 2010-11-15 LAB — TYPE AND SCREEN: Antibody Screen: NEGATIVE

## 2010-11-16 LAB — URINALYSIS, ROUTINE W REFLEX MICROSCOPIC
Ketones, ur: NEGATIVE mg/dL
Nitrite: NEGATIVE
Specific Gravity, Urine: 1.025 (ref 1.005–1.030)
pH: 6.5 (ref 5.0–8.0)

## 2010-11-16 LAB — FETAL FIBRONECTIN: Fetal Fibronectin: NEGATIVE

## 2010-12-22 NOTE — Op Note (Signed)
[name], [name] NO.:  [id number]   MEDICAL RECORD NO.:  [id number]          PATIENT TYPE:  INP   LOCATION:  9123                          FACILITY:  [facility]   PHYSICIAN:  Roseanna Rainbow, M.D.DATE OF BIRTH:  10-23-1970   DATE OF PROCEDURE:  08/18/2007  DATE OF DISCHARGE:                               OPERATIVE REPORT   PREOPERATIVE DIAGNOSES:  1. Intrauterine pregnancy at 36+ weeks.  2. Placenta previa.  3. Oligohydramnios.   POSTOPERATIVE DIAGNOSES:  1. Intrauterine pregnancy at 36+ weeks.  2. Placenta previa.  3. Oligohydramnios.   PROCEDURE:  Primary low uterine flap elliptical cesarean delivery via  Pfannenstiel skin incision.   SURGEON:  Cyd Silence.   ANESTHESIA:  Spinal.   PATHOLOGY:  Placenta.   ESTIMATED BLOOD LOSS:  600 mL.   COMPLICATIONS:  None.   PROCEDURE:  The patient was taken to the operating room with an IV  running.  A spinal anesthetic was placed.  She was then placed in the  supine position with a left lateral tilt and prepped and draped in the  usual sterile fashion.  After a time-out had been completed, a  Pfannenstiel skin incision was then made with a scalpel and carried down  to the underlying fascia.  The fascia was nicked in the midline.  The  fascial incision was then extended bilaterally.  The superior aspect of  the fascial incision was tented up and the underlying rectus muscles  dissected off.  The inferior aspect of the fascial incision was  manipulated in a similar fashion.  The rectus muscles were separated in  the midline.  The parietal peritoneum was then entered bluntly.  This  incision was then extended superiorly and inferiorly with good  visualization of the bladder.  An Alexis retractor was then placed into  the incision.  The lower uterine segment was incised in a transverse  fashion with the scalpel.  The vesicouterine peritoneum was then tented  up and entered sharply.  This incision  was then extended bilaterally and  the bladder flap created bluntly.  The lower uterine segment was then  incised in a transverse fashion with a scalpel.  This incision was then  extended bluntly.  At this point, the there was blunt dissection through  the placenta down to the membranes which were ruptured.  The infant's  head was delivered atraumatically.  The oropharynx was suctioned with  the bulb suction.  The cord was clamped and cut.  The infant was handed  off to the awaiting neonatologist.  An umbilical artery pH was sent.  The placenta was then removed.  The intrauterine cavity was evacuated of  any remaining amniotic fluid, clots and debris with moist laparotomy  sponge.  The uterine incision was then reapproximated in a running  interlocking fashion using a running suture of 0 Monocryl.  A second  imbricating layer was then placed.  Adequate hemostasis was noted.  The  paracolic gutters were then irrigated.  The packs and retractor were  then removed from the incision.  The parietal peritoneum was  reapproximated in a running fashion using 2-0 Vicryl.  The fascia was  closed in a running fashion using running suture of 0 Vicryl.  The skin  was closed in a subcuticular fashion using 3-0 Monocryl.  At the close  of the procedure, the instrument and pack counts were said to be correct  x2.  Two grams of cephazolin had been given at cord clamp.  The patient  was taken to the PACU awake and in stable condition.      Roseanna Rainbow, M.D.  Electronically Signed     LAJ/MEDQ  D:  08/18/2007  T:  08/18/2007  Job:  [id number]

## 2010-12-22 NOTE — Discharge Summary (Signed)
[name], [name]         ACCOUNT NO.:  [id number]   MEDICAL RECORD NO.:  [id number]          PATIENT TYPE:  INP   LOCATION:  9157                          [facility]ILITY[facility]  [facility]   PHYSICIAN:  Roseanna Rainbow, M.D.DATE OF BIRTH:  16-Apr-1971   DATE OF ADMISSION:  03/20/2009  DATE OF DISCHARGE:  03/22/2009                               DISCHARGE SUMMARY   CHIEF COMPLAINT:  The patient is a 40 year old, para 7 with an estimated  date of confinement of April 19, 2009 with an intrauterine pregnancy  at 35.5 weeks with a nonreactive nonstress test and polyhydramnios for  further observation.  Please see the dictated history and physical.   HOSPITAL COURSE:  The patient was admitted.  The fetus initially was  monitored continuously.  The monitoring strips were felt to be  reassuring.  A biophysical profile on the day of admission was 8/8.  Polyhydramnios was again noted.  Initially, there were labile blood  pressures with a diastolic blood pressure in the 100 range.  However,  subsequent blood pressures were normal.  Maternal fetal medicine was  consulted.  PIH labs were normal.  A 24-hour urine for protein and  creatinine showed a normal clearance and a urine protein of 78 mg per  day.  On hospital day 2, the patient complained of a migraine headache  which responded to parenteral metoclopramide and Benadryl.  A fasting  CBG was 116.  The patient was placed on an ADA diet.  Subsequent CBGs  were in range.   DISCHARGE DIAGNOSES:  Intrauterine pregnancy at 36 weeks with history of  chronic hypertension, superimposed pregnancy-induced hypertension,  reassuring antenatal testing, gestational diabetes, diet-controlled.   CONDITION:  Stable.   DIET:  2000 kcal ADA diet.   MEDICATIONS:  Included flexor, prenatal vitamins.   DISPOSITION:  The patient was to keep the previous appointment for  August 16.   ACTIVITY:  Ad lib.  The patient was also counseled regarding kick-  counts.      Roseanna Rainbow, M.D.  Electronically Signed     LAJ/MEDQ  D:  03/22/2009  T:  03/22/2009  Job:  [id number]

## 2010-12-22 NOTE — Op Note (Signed)
NAMECARYLE, HELGESON         ACCOUNT NO.:  [id number]   MEDICAL RECORD NO.:  [id number]          PATIENT TYPE:  INP   LOCATION:  9114                          [facility]   PHYSICIAN:  Roseanna Rainbow, M.D.DATE OF BIRTH:  [birth year]   DATE OF PROCEDURE:  04/01/2009  DATE OF DISCHARGE:                               OPERATIVE REPORT   PREOPERATIVE DIAGNOSES:  1. Intrauterine pregnancy at 37 plus weeks status post an      amniocentesis for fetal lung maturity.  2. Gestational diabetes, diet controlled.  3. Polyhydramnios.  4. History of chronic hypertension.  5. History of a previous cesarean delivery.  6. Desires a sterilization.  7. Declines vaginal birth after cesarean.   POSTOPERATIVE DIAGNOSES:  1. Intrauterine pregnancy at 37 plus weeks status post an      amniocentesis for fetal lung maturity.  2. Gestational diabetes, diet controlled.  3. Polyhydramnios.  4. History of chronic hypertension.  5. History of a previous cesarean delivery.  6. Desires a sterilization.  7. Declines vaginal birth after cesarean.   PROCEDURE:  Repeat cesarean delivery via transverse incision and  modified Pomeroy bilateral tubal ligation.   SURGEONS:  1. Roseanna Rainbow, MD  2. Charles A. Clearance Coots, MD   ANESTHESIA:  Spinal.   The placenta to pathology.   ESTIMATED BLOOD LOSS:  600 mL.   COMPLICATIONS:  None.   PROCEDURE:  The patient was taken to the operating room with an IV  running.  A spinal anesthetic was placed without difficulty.  She was  placed in the dorsal supine position with a leftward tilt and prepped  and draped in the usual sterile fashion.  After a time-out had been  completed, the previous transverse scar was then incised with a scalpel  and carried down to the underlying fascia with the Bovie.  The fascia  was incised along the length of the incision.  The superior aspect of  the fascial incision was tented up and the underlying rectus muscles  dissected off.  The inferior aspect of the fascial incision was  manipulated in a similar fashion.  The rectus muscles were separated in  the midline.  The parietal peritoneum was tented up and entered sharply.  This incision was then extended superiorly and inferiorly with good  visualization of the bladder.  The bladder blade was then placed.  The  vesicouterine peritoneum was tented up and entered sharply.  This  incision was then extended bilaterally, and the bladder flap was created  using both sharp and blunt dissection.  The bladder blade was then  replaced.  The lower uterine segment was incised in a transverse fashion  with the scalpel.  The incision was extended bluntly.  The position of  the head was occiput posterior.  The infant's head was delivered with  vacuum assistance.  The oropharynx was then suctioned with a bulb  suction.  The cord was clamped and cut.  The infant was handed off to  the awaiting neonatologist.  The placenta was then removed.  The  intrauterine cavity was evacuated of any remaining amniotic fluid,  clots, and debris  with a moistened laparotomy sponge.  The uterine  incision was then reapproximated in a running interlocking fashion using  a suture of 0 Monocryl.  The left fallopian tube was then identified and  followed out to the fimbriated end.  The mid isthmic portion of the tube  was then grasped with a Babcock clamp.  A 2-cm segment of tube was then  doubly ligated with 0 plain and excised.  Adequate hemostasis was noted.  The right fallopian tube was manipulated in a similar fashion.  The  paracolic gutters were then irrigated.  The parietal peritoneum was then  reapproximated in a running fashion with a suture of 2-0 Vicryl.  The  fascia was closed with 2 running sutures of 0 PDS tied in the midline.  The skin was closed in a subcuticular fashion using a suture of 3-0  Monocryl.  A wound VAC was then placed over the incision.  At the close  of the  procedure, the instrument and pack counts were said to be correct  x2.  The patient was taken to the PACU, awake and in stable condition.      Roseanna Rainbow, M.D.  Electronically Signed     LAJ/MEDQ  D:  04/01/2009  T:  04/02/2009  Job:  [id number]

## 2010-12-22 NOTE — H&P (Signed)
Anna [name], NATION         ACCOUNT NO.:  [id number]   MEDICAL RECORD NO.:  [id number]          PATIENT TYPE:  INP   LOCATION:  9157                          [facility]   PHYSICIAN:  Roseanna Rainbow, M.D.DATE OF BIRTH:  10-21-1970   DATE OF ADMISSION:  03/20/2009  DATE OF DISCHARGE:                              HISTORY & PHYSICAL   CHIEF COMPLAINT:  The patient is a 40 year old gravida 48, para 7 with  an estimated date of confinement of April 19, 2009, with an  intrauterine pregnancy at 35.5 weeks with a nonreactive NST and  polyhydramnios for further observation.   HISTORY OF PRESENT ILLNESS:  The pregnancy has been complicated by a  history of pre-existing essential hypertension.  The blood pressures had  been within range without medications.  Serial ultrasounds for growth  had been normal.  A nonstress test in the office had been minimally  reactive and the patient has had a weekly biophysical profiles.  A BPP  on March 17, 2009, was 8/8; however, the AFI was 33.  The patient  reports decreased fetal movement.  The patient has also had  musculoskeletal pain and has been taking Ultram and Flexeril.   MEDICATIONS:  Please see the medication reconciliation form.   OB RISK FACTORS:  Previous C-section, iron-deficiency anemia, advanced  maternal age, grand multipara, polyhydramnios, leiomyomatous uterus,  history of genital herpes, and history of essential hypertension.   PAST OBSTETRICAL HISTORY:  In 1990, there was an ectopic pregnancy.  In  February 1991, she delivered a live-born female, birth weight was 6  pounds 6 ounces, vaginal delivery, no complications.  In September 1992,  she delivered a live-born female, birth weight 7 pounds 4 ounces, vaginal  delivery, no complications.  In 1993, there was a spontaneous abortion  and a D and C.  In October 1997, she delivered a live-born female; birth  weight 8 pounds 5 ounces, vaginal delivery, no complications.   In July  2001, she delivered a live-born female, birth weight 7 pounds 6 ounces,  vaginal delivery, complications none.  In August 2002, she delivered a  live-born female, birth weight 7 pounds 7 ounces, vaginal delivery, no  complications.  In 2006, there was a spontaneous abortion and a D and C.  In April 2007, she delivered a live-born female, full term, birth weight  6 pounds 5 ounces, vaginal delivery, no complications.  In January 2009,  she underwent a cesarean delivery for placenta previa and superimposed  preeclampsia at 36 weeks, birth weight 5 pounds 13 ounces, live-born  female.   PRENATAL LABS:  Blood type A positive, antibody screen negative.  Chlamydia probe negative.  Urine culture and sensitivity, insignificant  growth.  Pap smear negative.  GC probe negative.  One-hour GCT 127,  hepatitis B surface antigen negative.  Hematocrit of May 2010, 26.3,  hemoglobin 8.3, HIV nonreactive, quad screen negative, platelets  213,000, RPR nonreactive, rubella immune, sickle cell negative,  varicella immune.   PAST GYN HISTORY:  Please see the above.  There is a history of a  laparoscopic salpingectomy for the ectopic pregnancy.   PAST MEDICAL  HISTORY:  Migraine headaches, hypertension, and asthma.   PAST SURGICAL HISTORY:  Please see the above, ankle fracture with  fixation.   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  Does not give any significant history of alcohol usage, has no  significant smoking history.   FAMILY HISTORY:  Remarkable for hypertension.   PHYSICAL EXAMINATION:  VITAL SIGNS:  Blood pressure 138/98.  Nonstress  test nonreactive.  GENERAL:  In no apparent distress.  LUNGS:  Clear to auscultation bilaterally.  HEART:  Regular rate and rhythm.  ABDOMEN:  Gravid.  PELVIC:  Deferred.   ASSESSMENT:  Multipara at 35-1/2 weeks with polyhydramnios, nonreactive  nonstress test, however, in the setting of a recent normal biophysical   profile.   PLAN:  At least a 23-hour observation with continuous fetal heart  tracing monitoring.  We will also repeat a biophysical profile.      Roseanna Rainbow, M.D.  Electronically Signed     LAJ/MEDQ  D:  03/20/2009  T:  03/21/2009  Job:  [id number]

## 2010-12-22 NOTE — H&P (Signed)
NAMEFRANCELY, CRAW NO.:  [id number]   MEDICAL RECORD NO.:  [id number]          PATIENT TYPE:  OUT   LOCATION:  ULT                           [facility]   PHYSICIAN:  Roseanna Rainbow, M.D.DATE OF BIRTH:  12/08/1970   DATE OF ADMISSION:  08/17/2007  DATE OF DISCHARGE:                              HISTORY & PHYSICAL   REPORT TITLE:  PREOPERATIVE HISTORY AND PHYSICAL.   DATE OF PLANNED PROCEDURE:  August 18, 2007.   CHIEF COMPLAINT:  The patient is a 40 year old para 5 with an estimated  date of confinement of September 14, 2007, with an intrauterine pregnancy  at 36+ weeks with a persistent placenta previa for a cesarean delivery.   HISTORY OF PRESENT ILLNESS:  Please see the above.  The patient has been  followed collaboratively with [facility] [place].  Serial ultrasounds have documented persistence of a marginal placenta  previa anterior in location.  An ultrasound performed on July 19, 2007, at 31 weeks 6 days again marginal placenta previa anterior.  The  amniotic fluid index was 17.8.  The estimated fetal weight percentile  was the 54th percentile.  On August 16, 2006, the patient was brought to  the radiology suite at [facility] to attempt an amniocentesis to  document fetal lung maturity.  However, the amniotic fluid index was  7.6.  This was at the 5th percentile.  The amniocentesis was deferred.   ALLERGIES:  SHELLFISH.  NO KNOWN DRUG ALLERGIES.   MEDICATIONS:  Please see the medication reconciliation form.   OB RISK FACTORS:  1. Urinary tract infection.  2. Anemia.  3. Advanced maternal age.  4. Multiparity.  5. Chronic hypertension   PRENATAL LABORATORY STUDIES:  Urine culture and sensitivity May 25, 2007, insignificant growth.  On January 19, 1999, Proteus mirabilis 1-hour  GCG 131.  Hepatitis B surface antigen negative, hematocrit 26.6,  hemoglobin 8.1, human immunodeficiency virus nonreactive,  platelets  251,000.  Quad screen normal.  Blood type is A+, antibody screen  negative, RPR nonreactive, rubella immune, sickle-cell negative.  A 24-  hour urine for protein 9 mg/dL on July 28, 2007, with a normal  creatinine clearance.   PAST GYNECOLOGY SURGICAL HISTORY:  1. Dilation and curettage.  2. Questionable salpingectomy for tubal ectopic pregnancy.   PAST MEDICAL HISTORY:  1. Iron-deficiency anemia.  2. Migraine headaches.  3. Hypertension.   PAST SURGICAL HISTORY:  Please see the above.   SOCIAL HISTORY:  She works at [employer]  Marrital History:  She is  married, living with her spouse, does not give any significant history  of alcohol usage.  She has no significant smoking history.   GENETIC HISTORY:  A daughter with a chromosome deletion.   PAST OB HISTORY:  In January 1990 there was an ectopic pregnancy.  In  February 1991 she was delivered of a live-born female vaginal delivery,  6 pounds 6 ounces, no complications.  In September 1992 she was  delivered of a live-born female, 7 pounds 4 ounces vaginal delivery, no  complications.  In 1993 there was a first-trimester spontaneous abortion  with a dilation and curettage.  In October 1997 she was delivered of a  live-born female, 8 pounds 5 ounces vaginal delivery, no complications.  In July 2001 she was delivered of a live-born female, 7 pounds 6 ounces  vaginal delivery, no complications.  In August 2002 she was delivered of  a live-born female, 7 pounds 7 ounces vaginal delivery, no  complications.  In 2006 there was a first-trimester spontaneous abortion  with a dilation and curettage.  In April 2007 she was delivered of a  live-born female, 6 pounds 5 ounces vaginal delivery, no complications.   PHYSICAL EXAMINATION:  VITAL SIGNS:  Blood pressure 130s/80s.  GENERAL:  Obese African American female in no apparent distress.  LUNGS:  Clear to auscultation bilaterally.  HEART:  Regular rate and rhythm.   ABDOMEN:  Gravid.  PELVIC:  Exam deferred.   ASSESSMENT:  Grand multipara at 36+ weeks with a persistent placenta  previa and oligohydramnios for a cesarean delivery-- in the setting of  iron deficiency anemia, moderate.   PLAN:  Admission cesarean delivery.  The risks, benefits and alternative  forms of management were reviewed with the patient and informed consent  had been obtained.      Roseanna Rainbow, M.D.  Electronically Signed     LAJ/MEDQ  D:  08/17/2007  T:  08/18/2007  Job:  [id number]

## 2010-12-22 NOTE — Discharge Summary (Signed)
[name], [name]         ACCOUNT NO.:  [id number]   MEDICAL RECORD NO.:  [id number]          PATIENT TYPE:  INP   LOCATION:  9114                          [facility]   PHYSICIAN:  Charles A. Clearance Coots, M.D.DATE OF BIRTH:  08-25-1970   DATE OF ADMISSION:  04/01/2009  DATE OF DISCHARGE:  04/04/2009                               DISCHARGE SUMMARY   ADMITTING DIAGNOSES:  37 weeks' gestation status post amniocentesis for  fetal lung maturity, gestational diabetes mellitus, polyhydramnios,  chronic hypertension, history of previous cesarean section, desires  sterilization.   DISCHARGE DIAGNOSES:  37 weeks' gestation status post amniocentesis for  fetal lung maturity, gestational diabetes mellitus, polyhydramnios,  chronic hypertension, history of previous cesarean section, desires  sterilization, status post repeat low transverse cesarean section and  bilateral tubal ligation on April 01, 2009.  The infant was taken to  the Neonatal Intensive Care Unit for management.  The mother had a  normal postoperative course.  She was discharged home on postoperative  day #3 in good condition.   REASON FOR ADMISSION:  A 40 year old gravida 2, para 7 with estimated  date of confinement of April 19, 2009, 37 weeks' gestation, status  post amniocentesis for fetal lung maturity with positive PG study for  fetal lung maturity.  She has a history of gestational diabetes,  polyhydramnios, essential hypertension.  Blood pressures had been in the  range without medications until approximately 1 day prior to  presentation and she had elevated blood pressures, and Aldomet was  started.  Serial ultrasounds for growth had been normal.  Fetal  antenatal testing had been normal, as well as biophysical profiles.   PAST MEDICAL HISTORY:   SURGERY:  Cesarean section.   ILLNESSES:  Iron-deficiency anemia, genital herpes, essential  hypertension.   MEDICATIONS:  Protonix, Ambien, Flexeril,  Aldomet, prenatal vitamins.   ALLERGIES:  No known drug allergies.   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  Negative tobacco, alcohol or recreational drug use.   FAMILY HISTORY:  Chronic hypertension.   PHYSICAL EXAMINATION:  GENERAL:  Obese black female in no acute  distress, afebrile.  VITAL SIGNS:  Stable.  LUNGS:  Clear to auscultation bilaterally.  HEART:  Regular rate and rhythm.  ABDOMEN:  Gravid, nontender.  PELVIC:  Deferred.   ADMITTING LABORATORY FINDINGS:  Hemoglobin 9, hematocrit 27, white blood  cell count 6200, platelets 255,000.  Comprehensive metabolic panel was  within normal limits.  Glucose was 111 mg/dL.  RPR was nonreactive.   HOSPITAL COURSE:  The patient underwent a repeat low transverse cesarean  section and bilateral partial salpingectomy on April 01, 2009.  There  were no intraoperative complications.  Postoperative course was  uncomplicated.  The patient was discharged home on postop day #3 in good  condition.   DISCHARGE LABORATORY FINDINGS:  Hemoglobin 7, hematocrit 21, white blood  cell count 7100, platelets 179,000.  The patient did have anemia  postoperatively and upon discharge home, but she was clinically and  hemodynamically stable and able to ambulate and sit without dizziness or  headaches or difficulty breathing.  She has  a baseline hemoglobin of 9  on admission and was therefore chronic iron-deficiency anemia.  She will  be discharged home on iron therapy.   DISCHARGE DISPOSITION:   MEDICATIONS:  Continue prenatal vitamins, iron was prescribed for  anemia, Percocet and ibuprofen were prescribed for pain.  Imitrex was  prescribed for migraine headaches.  Continue meds that were taken prior  to admission to the hospital, except for Aldomet.  Routine written  instructions were given for discharge after cesarean section.  The  patient is to call the office for a followup appointment in 2 weeks.       Charles A. Clearance Coots, M.D.  Electronically Signed     CAH/MEDQ  D:  04/04/2009  T:  04/05/2009  Job:  [id number]

## 2010-12-22 NOTE — H&P (Signed)
NAMESHELVY, HECKERT         ACCOUNT NO.:  [id number]   MEDICAL RECORD NO.:  [id number]          PATIENT TYPE:  OUT   LOCATION:  [facility]                          FACILITY:  [facility]   PHYSICIAN:  Roseanna Rainbow, M.D.DATE OF BIRTH:  04-Aug-1971   DATE OF ADMISSION:  03/31/2009  DATE OF DISCHARGE:  03/31/2009                              HISTORY & PHYSICAL   CHIEF COMPLAINT:  The patient is a 40 year old gravida 52 para 7 with an  estimated date of confinement of April 19, 2009, with an  intrauterine pregnancy at 37 plus weeks, now status post amniocentesis  for fetal lung maturity with an elevated LS ratio and positive PG.  She  presents for a cesarean delivery and bilateral tubal ligation.   HISTORY OF PRESENT ILLNESS:  The pregnancy has been complicated by a  history of preexisting essential hypertension.  She has also been  recently diagnosed with polyhydramnios.  The blood pressures were within  range without medications until 1 day prior to presentation.  Aldomet  was started.  Serial ultrasounds for growth had been normal.  Fetal  antenatal testing has been normal as well with BPPs of 8/8.   MEDICATIONS:  Please see the medication reconciliation form.  OB risk  factors, previous C-section, iron-deficiency anemia, advanced maternal  age, grand multiparity, polyhydramnios, leiomyomatous uterus, history of  genital herpes, history of essential hypertension, and polyhydramnios   PAST OBSTETRICAL HISTORY:  In 1990, there was an ectopic pregnancy.  In  February 1991, she was delivered of a live born female, birth weight 6  pounds 6 ounces vaginal delivery.  No complications.  In September 1992,  she was delivered of a live born female, birth weight 7 pounds 4 ounces,  vaginal delivery, no complications.  In 1993, there was a spontaneous  abortion and a D and C.  In October 1997, she was delivered of a live  born female, birth weight 8 pounds 5 ounces, vaginal delivery, no  complications.  In July 2001, she was delivered of a live born female,  birth weight 7 pounds 6 ounces, vaginal delivery, no complications.  In  August 2002, she was delivered of a live born female, birth weight 7  pounds 7 ounces, vaginal delivery, no complications.  In 2006, there was  a spontaneous abortion and a D and C.  In 2007, she was delivered of a  live born female, full term, birth weight 6 pounds 5 ounces vaginal  delivery, no complications.  In January 2009, she underwent a cesarean  delivery for placenta previa and superimposed preeclampsia at 36 weeks,  the birth weight was 5 pounds 13 ounces and she was delivered of a live  born female.  Gestational diabetes, diet controlled.   PRENATAL LABORATORY DATA:  Blood type A+, antibody screen negative.  Chlamydia probe negative.  Urine culture and sensitivity insignificant  growth.  Pap smear negative.  GC probe negative.  One-hour GTT 127 at 24  weeks.  Subsequent CBGs elevated and controlled on the diet.  Hepatitis  B surface antigen negative, hematocrit 26.3, hemoglobin 8.3, HIV  nonreactive.  Quad screen negative, platelets 213,000.  RPR nonreactive,  rubella immune, sickle cell negative, varicella immune.   PAST GYNECOLOGIC HISTORY:  Please see the above.  There is a history of  a laparoscopic salpingectomy for an ectopic pregnancy.   PAST MEDICAL HISTORY:  Migraine headaches, hypertension, and asthma.   PAST SURGICAL HISTORY:  Please see the above.  There is a history of an  ankle fracture with fixation.   SOCIAL HISTORY:  She is a homemaker, married, living with her spouse,  does not give any significant history of alcohol usage, has no  significant smoking history.   FAMILY HISTORY:  Remarkable for chronic hypertension.   PHYSICAL EXAMINATION:  VITAL SIGNS: Blood pressure 130s to 140s over 90s  to 100.  Nonstress test reactive.  GENERAL:  No apparent distress.  LUNGS:  Clear to auscultation bilaterally.  HEART:   Regular rate and rhythm.  ABDOMEN:  Gravid.  PELVIC:  Deferred.   ASSESSMENT:  Multipara at 35 plus weeks with a pregnancy complicated by  polyhydramnios, preexisting hypertension, gestational diabetes, diet  controlled, now status post an amniocentesis for fetal lung maturity  with indices consistent with likely lung maturity.  The patient also  desires a sterilization procedure.   PLAN:  Admission, repeat cesarean delivery and bilateral tubal ligation.  The risks, benefits, and alternative forms of management were reviewed  with the patient and informed consent had been obtained.      Roseanna Rainbow, M.D.  Electronically Signed     LAJ/MEDQ  D:  03/31/2009  T:  04/01/2009  Job:  [id number]

## 2010-12-25 NOTE — Discharge Summary (Signed)
Anna [name], NONG NO.:  [id number]   MEDICAL RECORD NO.:  [id number]          PATIENT TYPE:  INP   LOCATION:  9307                          [facility]ACILITY:  [facility]   PHYSICIAN:  Roseanna Rainbow, M.D.DATE OF BIRTH:  1971/01/29   DATE OF ADMISSION:  11/27/2005  DATE OF DISCHARGE:  12/01/2005                                 DISCHARGE SUMMARY   CHIEF COMPLAINT:  The patient is a 40 year old status post a spontaneous  vaginal delivery on April 15 now with elevated blood pressures and a  headache.  Please see the dictated history and physical.   HOSPITAL COURSE:  The patient was admitted and started on magnesium sulfate  seizure prophylaxis.  This was discontinued on hospital day two.  Social  services were consulted.  Her blood pressures stabilized on the  antihypertensive therapy.  She was discharged to home on April 25.   DISCHARGE DIAGNOSIS:  Postpartum pregnancy induced hypertension.   CONDITION:  Stable.   DISCHARGE INSTRUCTIONS:  Diet is regular.  Activity is progressive activity.   MEDICATIONS:  Dilaudid, hydrochlorothiazide, labetalol, and continue  previous medications.   DISPOSITION:  The patient was to follow up in the office in several days.      Roseanna Rainbow, M.D.  Electronically Signed     LAJ/MEDQ  D:  12/22/2005  T:  12/22/2005  Job:  [id number]

## 2010-12-25 NOTE — Op Note (Signed)
[name], Anna [name] NO.:  [id number]   MEDICAL RECORD NO.:  [id number]          PATIENT TYPE:  AMB   LOCATION:  [facility]                           [facility]ILITY:  [facility]   PHYSICIAN:  Charles A. Clearance Coots, M.D.DATE OF BIRTH:  12-19-1970   DATE OF PROCEDURE:  10/13/2004  DATE OF DISCHARGE:                                 OPERATIVE REPORT   PREOPERATIVE DIAGNOSIS:  7-week intrauterine fetal demise on ultrasound.   POSTOPERATIVE DIAGNOSIS:  7-week intrauterine fetal demise on ultrasound.   PROCEDURE:  Suction, dilation and evacuation.   SURGEON:  Charles A. Clearance Coots, M.D.   ANESTHESIA:  MAC with paracervical block.   COMPLICATIONS:  None.   ESTIMATED BLOOD LOSS:  100 mL   SPECIMEN:  Products of conception.   OPERATION:  The patient is brought to the operating room and after  satisfactory IV sedation, the legs were brought up in stirrups and the  vagina was prepped and draped in the usual sterile fashion.  The urinary  bladder was emptied of approximately 50 mL of clear urine. Bimanual  examination revealed the uterus to be in midposition about seven to eight  weeks' size.  A sterile speculum was inserted in the vaginal vault and  cervix was isolated. The anterior lip of the cervix was grasped with a  double tooth tenaculum.  A paracervical block of approximately 20 mL of 2%  Xylocaine was injected in each lateral fornix, approximately 10 mL in each  lateral fornix in routine fashion.  The uterine cavity was then sounded for  the axis confirmation and the cervix was dilated to #27 [facility] dilator.  A #8  suction catheter was then easily introduced to the uterine cavity and all  contents were evacuated.  There was no active bleeding at the conclusion of  the procedure.  The uterus contracted down quite well and the endometrial  surface felt gritty at the conclusion of procedure.  All instruments were  then retired. The patient tolerated the procedure well and transported to  recovery in satisfactory condition.      CAH/MEDQ  D:  10/13/2004  T:  10/13/2004  Job:  [id number]
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
## 2010-12-25 NOTE — Discharge Summary (Signed)
Anna [name], Anna [name] NO.:  [id number]   MEDICAL RECORD NO.:  [id number]          PATIENT TYPE:  INP   LOCATION:  9123                          [facility]ILITY[facility]   PHYSICIAN:  Roseanna Rainbow, M.D.DATE OF BIRTH:  Dec 06, 1970   DATE OF ADMISSION:  08/18/2007  DATE OF DISCHARGE:  08/21/2007                               DISCHARGE SUMMARY   CHIEF COMPLAINT:  The patient is a 40 year old para 5 with an estimated  date of confinement of September 14, 2007, with an intrauterine pregnancy  at 36-plus weeks with a persistent placenta previa for a cesarean  delivery.  Please see the dictated history and physical.   HOSPITAL COURSE:  The patient was admitted and underwent a cesarean  delivery.  Please see the dictated operative summary.  On postoperative  day #1, her hemoglobin was 6.6.  She was asymptomatic and  hemodynamically stable.  The remainder of her hospital course was  uneventful.  She received Depo-Provera prior to discharge to home.   DISCHARGE DIAGNOSIS:  Intrauterine pregnancy at 36-plus weeks.  Placenta  previa, oligohydramnios, anemia.   PROCEDURE:  Cesarean delivery.   CONDITION:  Stable.   DIET:  Regular.   ACTIVITY:  Pelvic rest, progressive activity.   Medications included Percocet, ibuprofen, MiraLax, Colace and ferrous  sulfate.   DISPOSITION:  The patient was to follow up in the office in 2 weeks.      Roseanna Rainbow, M.D.  Electronically Signed     LAJ/MEDQ  D:  09/01/2007  T:  09/02/2007  Job:  [id number]

## 2010-12-25 NOTE — Op Note (Signed)
NAME:  Anna [name], Anna [name] NO.:  [id number]   MEDICAL RECORD NO.:  [id number]                   PATIENT TYPE:  INP   LOCATION:  0103                                 FACILITY:  [facility]   PHYSICIAN:  Harvie Junior, M.D.                DATE OF BIRTH:  [birth year]   DATE OF PROCEDURE:  09/02/2002  DATE OF DISCHARGE:                                 OPERATIVE REPORT   PREOPERATIVE DIAGNOSIS:  Fracture, right ankle, with mortise displacement.   POSTOPERATIVE DIAGNOSIS:  Fracture, right ankle, with mortise displacement.   OPERATION PERFORMED:  Open reduction and internal fixation of right  bimalleolar ankle fracture with fixation of the lateral malleolus with an  interfragmentary screw and a five-hole one-third tubular plate.   SURGEON:  Harvie Junior, M.D.   ASSISTANT:  Marshia Ly, P.A.   ANESTHESIA:  Spinal.   BRIEF HISTORY:  The patient is a 40 year old female with a long history of  having falling down the stairs.  She also was evaluated in the emergency  room where she was noted to have a displaced ankle fracture.  She had  mortise widening and displacement.  We ultimately talked about treatment  options, and I ultimately felt that open reduction and internal fixation was  the best appropriate course of action; and, she was brought to the operating  room for this procedure.   DESCRIPTION OF OPERATION:  The patient was brought to the operating room and  after adequate anesthesia was obtained, the patient was placed on the  operating table.  The right leg was prepped and draped in the usual sterile  fashion.  Following Esmarch exsanguination of the extremity, the tourniquet  was inflated to 350 mmHg.  Following this, an linear incision was made over  the lateral malleolus, subcutaneous tissue down to the level of the fascia.  The fracture was identified, cleansed of old healing elements, and  anatomically reduced.  Interfragmentary compressive screw  was put in place.  A one-third tubular plate, five-hole, was then bent to shape the fibula and  neutralization was obtained in standard AO fashion.   Following this the wound was copiously irrigated and suctioned dry.  The  periosteum was then closed with 2-0 Vicryl interrupted sutures, and the skin  with skin staples.  Sterile compressive dressing was applied as well as the  knee in a posterior splint.  Fluoroscopic imaging was used throughout the  case to make sure that there was, in fact, anatomic alignment was achieved.  The posterior malleolus did reduce anatomically during the case with less  than 5% of the overall joint surface.                                                 Harvie Junior, M.D.  JLG/MEDQ  D:  09/02/2002  T:  09/02/2002  Job:  [id number]

## 2011-03-17 ENCOUNTER — Emergency Department (HOSPITAL_BASED_OUTPATIENT_CLINIC_OR_DEPARTMENT_OTHER)
Admission: EM | Admit: 2011-03-17 | Discharge: 2011-03-17 | Disposition: A | Discharge status: Home / Self Care | Payer: BC Managed Care – PPO | Attending: Emergency Medicine | Admitting: Emergency Medicine

## 2011-03-17 ENCOUNTER — Encounter: Payer: Self-pay | Admitting: *Deleted

## 2011-03-17 DIAGNOSIS — R109 Unspecified abdominal pain: Secondary | ICD-10-CM | POA: Insufficient documentation

## 2011-03-17 DIAGNOSIS — N390 Urinary tract infection, site not specified: Secondary | ICD-10-CM | POA: Insufficient documentation

## 2011-03-17 HISTORY — DX: Essential (primary) hypertension: I10

## 2011-03-17 LAB — URINALYSIS, ROUTINE W REFLEX MICROSCOPIC
Bilirubin Urine: NEGATIVE
Nitrite: POSITIVE — AB
Specific Gravity, Urine: 1.021 (ref 1.005–1.030)
Urobilinogen, UA: 1 mg/dL (ref 0.0–1.0)

## 2011-03-17 LAB — URINE MICROSCOPIC-ADD ON

## 2011-03-17 MED ORDER — CEPHALEXIN 500 MG PO CAPS: 500.0000 mg | ORAL_CAPSULE | Freq: Four times a day (QID) | ORAL | Status: AC

## 2011-03-17 MED ORDER — CEFTRIAXONE SODIUM 1 G IJ SOLR
1.0000 g | Freq: Once | INTRAMUSCULAR | Status: AC
  Administered 2011-03-17: 1 g via INTRAMUSCULAR
  Filled 2011-03-17: qty 1

## 2011-03-17 MED ORDER — LIDOCAINE HCL (PF) 1 % IJ SOLN
INTRAMUSCULAR | Status: AC
  Administered 2011-03-17: 1 mL
  Filled 2011-03-17: qty 5

## 2011-03-17 MED ORDER — HYDROCODONE-ACETAMINOPHEN 5-325 MG PO TABS
1.0000 | ORAL_TABLET | ORAL | Status: AC | PRN
Start: 2011-03-17 — End: 2011-03-27

## 2011-03-17 MED ORDER — LIDOCAINE HCL (PF) 1 % IJ SOLN
1.0000 mL | Freq: Once | INTRAMUSCULAR | Status: AC
  Administered 2011-03-17: 1 mL

## 2011-03-17 MED ORDER — DEXTROSE 5 % IV SOLN
INTRAVENOUS | Status: AC
  Filled 2011-03-17: qty 1

## 2011-03-17 NOTE — ED Notes (Signed)
Pt c/o abd pain  X 2 days

## 2011-03-17 NOTE — ED Provider Notes (Signed)
History     CSN: [id number] Arrival date & time: 03/17/2011  5:39 PM  Chief Complaint  Patient presents with  . Abdominal Pain  . Flank Pain   Patient is a 40 y.o. female presenting with abdominal pain and flank pain. The history is provided by the patient.  Abdominal Pain The primary symptoms of the illness include abdominal pain, diarrhea and dysuria. The primary symptoms of the illness do not include fever, shortness of breath, vomiting or hematemesis. The current episode started 2 days ago. The onset of the illness was gradual. The problem has been gradually worsening.  The dysuria is associated with frequency and urgency. The dysuria is not associated with hematuria.  The illness is associated with a recent illness. The patient states that she believes she is currently not pregnant. Additional symptoms associated with the illness include urgency, frequency and back pain. Symptoms associated with the illness do not include chills, constipation or hematuria.  Flank Pain Associated symptoms include abdominal pain. Pertinent negatives include no headaches and no shortness of breath.  MOSTLY SUPRAPUBIC PAIN AND DYSURIA AND SOME BACK PAIN. NO NV SOME DIARRHEA BUT 2 OTHER FAMILY MEMBERS WITH SAME.   Past Medical History  Diagnosis Date  . Migraine   . Hypertension     Past Surgical History  Procedure Date  . Tubal ligation   . Joint replacement   . Urethral dilation     History reviewed. No pertinent family history.  History  Substance Use Topics  . Smoking status: Never Smoker   . Smokeless tobacco: Not on file  . Alcohol Use: No    OB History    Grav Para Term Preterm Abortions TAB SAB Ect Mult Living                  Review of Systems  Constitutional: Negative for fever and chills.  HENT: Negative for neck pain.   Eyes: Negative for redness.  Respiratory: Negative for shortness of breath.   Gastrointestinal: Positive for abdominal pain and diarrhea. Negative for  vomiting, constipation and hematemesis.  Genitourinary: Positive for dysuria, urgency, frequency and flank pain. Negative for hematuria.  Musculoskeletal: Positive for back pain.  Neurological: Negative for weakness, numbness and headaches.  Hematological: Negative for adenopathy.    Physical Exam  BP 120/74  Pulse 70  Temp(Src) 98.6 F (37 C) (Oral)  Resp 16  Wt 234 lb (106.142 kg)  SpO2 100%  LMP 03/10/2011  Physical Exam  Nursing note and vitals reviewed. Constitutional: She is oriented to person, place, and time. She appears well-developed and well-nourished. No distress.  HENT:  Head: Normocephalic and atraumatic.  Eyes: Conjunctivae and EOM are normal. Pupils are equal, round, and reactive to light.  Neck: Normal range of motion. Neck supple.  Cardiovascular: Normal rate and regular rhythm.   No murmur heard. Pulmonary/Chest: Effort normal and breath sounds normal. She has no wheezes.  Abdominal: Soft. Bowel sounds are normal. There is tenderness.  Musculoskeletal: Normal range of motion. She exhibits no tenderness.  Neurological: She is alert and oriented to person, place, and time. No cranial nerve deficit. She exhibits normal muscle tone.  Skin: Skin is warm and dry. No rash noted.    ED Course  Procedures  MDM UA CW UTI AND PERHAPS EARLY PYELO. RX IN ED WITH ROCEPHIN IM. NOT TOXIC.   Results for orders placed during the hospital encounter of 03/17/11  URINALYSIS, ROUTINE W REFLEX MICROSCOPIC      Component Value Range  Color, Urine YELLOW  YELLOW    Appearance CLOUDY (*) CLEAR    Specific Gravity, Urine 1.021  1.005 - 1.030    pH 6.0  5.0 - 8.0    Glucose, UA NEGATIVE  NEGATIVE (mg/dL)   Hgb urine dipstick SMALL (*) NEGATIVE    Bilirubin Urine NEGATIVE  NEGATIVE    Ketones, ur NEGATIVE  NEGATIVE (mg/dL)   Protein, ur 30 (*) NEGATIVE (mg/dL)   Urobilinogen, UA 1.0  0.0 - 1.0 (mg/dL)   Nitrite POSITIVE (*) NEGATIVE    Leukocytes, UA MODERATE (*) NEGATIVE    URINE MICROSCOPIC-ADD ON      Component Value Range   Squamous Epithelial / LPF FEW (*) RARE    WBC, UA 11-20  <3 (WBC/hpf)   RBC / HPF 3-6  <3 (RBC/hpf)   Bacteria, UA MANY (*) RARE          Shelda Jakes, MD 03/17/11 2036

## 2011-04-29 LAB — KLEIHAUER-BETKE STAIN
Fetal Cells %: 0
Quantitation Fetal Hemoglobin: 0

## 2011-04-29 LAB — TYPE AND SCREEN
ABO/RH(D): A POS
Antibody Screen: NEGATIVE

## 2011-04-29 LAB — CBC
MCHC: 33.5
MCHC: 33.7
MCHC: 34
MCV: 74.6 — ABNORMAL LOW
MCV: 75.4 — ABNORMAL LOW
Platelets: 209
Platelets: 225
RBC: 2.6 — ABNORMAL LOW
RBC: 3.13 — ABNORMAL LOW
WBC: 5.9

## 2011-04-29 LAB — DIFFERENTIAL
Basophils Relative: 0
Eosinophils Absolute: 0.1
Monocytes Relative: 9
Neutrophils Relative %: 58

## 2011-04-29 LAB — COMPREHENSIVE METABOLIC PANEL
AST: 10
CO2: 22
Calcium: 8.4
Creatinine, Ser: 0.51
GFR calc Af Amer: >60
GFR calc non Af Amer: >60
Glucose, Bld: 85

## 2011-04-29 LAB — LACTATE DEHYDROGENASE: LDH: 87 — ABNORMAL LOW

## 2011-05-06 LAB — CBC
Hemoglobin: 9.4 — ABNORMAL LOW
MCHC: 32.2
MCV: 73.2 — ABNORMAL LOW
RBC: 3.98
RDW: 16.9 — ABNORMAL HIGH

## 2011-05-06 LAB — BASIC METABOLIC PANEL
CO2: 25
Chloride: 108
Creatinine, Ser: 0.8
GFR calc Af Amer: >60
Glucose, Bld: 87

## 2011-05-06 LAB — PREGNANCY, URINE: Preg Test, Ur: NEGATIVE

## 2011-05-14 ENCOUNTER — Emergency Department (INDEPENDENT_AMBULATORY_CARE_PROVIDER_SITE_OTHER): Payer: BC Managed Care – PPO

## 2011-05-14 ENCOUNTER — Encounter (HOSPITAL_BASED_OUTPATIENT_CLINIC_OR_DEPARTMENT_OTHER): Payer: Self-pay

## 2011-05-14 ENCOUNTER — Emergency Department (HOSPITAL_BASED_OUTPATIENT_CLINIC_OR_DEPARTMENT_OTHER)
Admission: EM | Admit: 2011-05-14 | Discharge: 2011-05-14 | Disposition: A | Discharge status: Home / Self Care | Payer: BC Managed Care – PPO | Attending: Emergency Medicine | Admitting: Emergency Medicine

## 2011-05-14 DIAGNOSIS — X500XXA Overexertion from strenuous movement or load, initial encounter: Secondary | ICD-10-CM

## 2011-05-14 DIAGNOSIS — Z79899 Other long term (current) drug therapy: Secondary | ICD-10-CM | POA: Insufficient documentation

## 2011-05-14 DIAGNOSIS — I10 Essential (primary) hypertension: Secondary | ICD-10-CM | POA: Insufficient documentation

## 2011-05-14 DIAGNOSIS — M25569 Pain in unspecified knee: Secondary | ICD-10-CM

## 2011-05-14 NOTE — ED Notes (Signed)
Twisted right knee this am-no fall

## 2011-05-14 NOTE — ED Provider Notes (Signed)
History   39yF with r knee pain. Twisted knee while chasing her 1y old. Did not fall. Persistent pain since. Can ambulate. No numbness, tingling or loss of strength. Denies significant pain anywhere else.  CSN: [id number] Arrival date & time: 05/14/2011  3:53 PM  Chief Complaint  Patient presents with  . Knee Injury    (Consider location/radiation/quality/duration/timing/severity/associated sxs/prior treatment) HPI  Past Medical History  Diagnosis Date  . Migraine   . Hypertension     Past Surgical History  Procedure Date  . Tubal ligation   . Foot surgery   . Ankle surgery   . Dilation and curettage of uterus     No family history on file.  History  Substance Use Topics  . Smoking status: Never Smoker   . Smokeless tobacco: Not on file  . Alcohol Use: No    OB History    Grav Para Term Preterm Abortions TAB SAB Ect Mult Living                  Review of Systems  All other systems reviewed and are negative.     Review of symptoms negative unless otherwise noted in HPI.   Allergies  Shellfish allergy  Home Medications   Current Outpatient Rx  Name Route Sig Dispense Refill  . DARIFENACIN HYDROBROMIDE 7.5 MG PO TB24 Oral Take 7.5 mg by mouth daily.      Marland Kitchen FERROUS SULFATE 325 (65 FE) MG PO TABS Oral Take 325 mg by mouth daily with breakfast.      . LABETALOL HCL 300 MG PO TABS Oral Take 600 mg by mouth 2 (two) times daily.      . TOPIRAMATE 200 MG PO TABS Oral Take 200 mg by mouth daily.      Marland Kitchen ZOLPIDEM TARTRATE 10 MG PO TABS Oral Take 10 mg by mouth at bedtime as needed. sleep       BP 139/82  Pulse 81  Temp(Src) 98.5 F (36.9 C) (Oral)  Resp 18  Ht 5\' 6"  (1.676 m)  Wt 258 lb (117.028 kg)  BMI 41.64 kg/m2  SpO2 100%  LMP 04/30/2011  Physical Exam  Constitutional: She appears well-nourished.  HENT:  Head: Normocephalic and atraumatic.  Eyes: Conjunctivae are normal.  Cardiovascular: Normal rate, regular rhythm and normal heart sounds.     Pulmonary/Chest: Effort normal and breath sounds normal.  Musculoskeletal: Normal range of motion. She exhibits no edema.  Neurological:       r knee without visible trauma. Symmetric as compared to l. No effusion, but exam somewhat limited because of body habitus. FROM. Mild tenderness along lateral aspect. No ligamentous laxity appreciatd. Neurovascularly intact distally.  Skin: Skin is warm and dry. No erythema.  Psychiatric: She has a normal mood and affect. Her behavior is normal. Thought content normal.    ED Course  Procedures (including critical care time)  Labs Reviewed - No data to display Dg Knee Complete 4 Views Right  05/14/2011  *RADIOLOGY REPORT*  Clinical Data: Twisted right knee this morning with pain  RIGHT KNEE - COMPLETE 4+ VIEW  Comparison: None.  Findings: The right knee joint spaces appear normal.  No fracture is seen.  No effusion is noted.  Alignment is normal.  IMPRESSION: Negative right knee.  Original Report Authenticated By: Juline Patch, M.D.     No diagnosis found.    MDM  39yf with R knee pain. Mild-to-moderate lateral knee tenderness on exam, otherwise unremarkable. No ligamentous laxity  appreciated. Ambulatory. XR neg for fx or effusion. Neurovascularly intact distally. Plan symptomatic tx and outpt fu.        Raeford Razor, MD 05/21/11 [phone]

## 2011-05-18 LAB — URINALYSIS, ROUTINE W REFLEX MICROSCOPIC
Bilirubin Urine: NEGATIVE
Glucose, UA: NEGATIVE
Ketones, ur: NEGATIVE
pH: 8

## 2011-05-20 LAB — URINALYSIS, ROUTINE W REFLEX MICROSCOPIC
Bilirubin Urine: NEGATIVE
Hgb urine dipstick: NEGATIVE
Specific Gravity, Urine: 1.025
pH: 6

## 2011-05-20 LAB — COMPREHENSIVE METABOLIC PANEL
ALT: <8
AST: 11
Alkaline Phosphatase: 62
CO2: 22
Chloride: 108
GFR calc Af Amer: >60
GFR calc non Af Amer: >60
Potassium: 3.5
Sodium: 134 — ABNORMAL LOW
Total Bilirubin: 0.3

## 2011-07-22 ENCOUNTER — Other Ambulatory Visit: Payer: Self-pay | Admitting: Obstetrics & Gynecology

## 2011-07-23 ENCOUNTER — Encounter (HOSPITAL_COMMUNITY): Payer: Self-pay | Admitting: Pharmacist

## 2011-07-27 ENCOUNTER — Inpatient Hospital Stay (HOSPITAL_COMMUNITY): Admission: RE | Admit: 2011-07-27 | Payer: BC Managed Care – PPO | Source: Ambulatory Visit

## 2011-07-30 ENCOUNTER — Ambulatory Visit (HOSPITAL_COMMUNITY)
Admission: RE | Admit: 2011-07-30 | Payer: BC Managed Care – PPO | Source: Ambulatory Visit | Admitting: Obstetrics & Gynecology

## 2011-07-30 ENCOUNTER — Encounter (HOSPITAL_COMMUNITY): Admission: RE | Payer: Self-pay | Source: Ambulatory Visit

## 2011-07-30 SURGERY — VULVAR LESION
Anesthesia: Choice

## 2012-04-30 ENCOUNTER — Emergency Department (HOSPITAL_BASED_OUTPATIENT_CLINIC_OR_DEPARTMENT_OTHER)
Admission: EM | Admit: 2012-04-30 | Discharge: 2012-04-30 | Disposition: A | Discharge status: Home / Self Care | Payer: BC Managed Care – PPO | Attending: Emergency Medicine | Admitting: Emergency Medicine

## 2012-04-30 ENCOUNTER — Emergency Department (HOSPITAL_BASED_OUTPATIENT_CLINIC_OR_DEPARTMENT_OTHER): Payer: BC Managed Care – PPO

## 2012-04-30 ENCOUNTER — Encounter (HOSPITAL_BASED_OUTPATIENT_CLINIC_OR_DEPARTMENT_OTHER): Payer: Self-pay | Admitting: *Deleted

## 2012-04-30 DIAGNOSIS — M25569 Pain in unspecified knee: Secondary | ICD-10-CM | POA: Insufficient documentation

## 2012-04-30 DIAGNOSIS — I10 Essential (primary) hypertension: Secondary | ICD-10-CM | POA: Insufficient documentation

## 2012-04-30 DIAGNOSIS — M25462 Effusion, left knee: Secondary | ICD-10-CM

## 2012-04-30 DIAGNOSIS — M25562 Pain in left knee: Secondary | ICD-10-CM

## 2012-04-30 MED ORDER — HYDROCODONE-ACETAMINOPHEN 5-325 MG PO TABS: 2.0000 | ORAL_TABLET | ORAL | Status: DC | PRN

## 2012-04-30 MED ORDER — IBUPROFEN 800 MG PO TABS: 800.0000 mg | ORAL_TABLET | Freq: Three times a day (TID) | ORAL | Status: DC

## 2012-04-30 NOTE — ED Notes (Addendum)
Pt c/o left knee pain and swelling. No known injury. "Feels like fluid " Also c/o "crick in neck"

## 2012-04-30 NOTE — ED Provider Notes (Signed)
History     CSN: [id number]  Arrival date & time 04/30/12  1804   First MD Initiated Contact with Patient 04/30/12 1900      Chief Complaint  Patient presents with  . Knee Pain    (Consider location/radiation/quality/duration/timing/severity/associated sxs/prior treatment) Patient is a 41 y.o. female presenting with knee pain. The history is provided by the patient. No language interpreter was used.  Knee Pain This is a new problem. The current episode started today. The problem occurs constantly. The problem has been unchanged. Associated symptoms include joint swelling. Nothing aggravates the symptoms. She has tried nothing for the symptoms. The treatment provided moderate relief.  Pt complains of pain in left knee.  Pt reports she saw her doctor for similar several months ago but was told everything was normal.  Past Medical History  Diagnosis Date  . Migraine   . Hypertension     Past Surgical History  Procedure Date  . Tubal ligation   . Foot surgery   . Ankle surgery   . Dilation and curettage of uterus     History reviewed. No pertinent family history.  History  Substance Use Topics  . Smoking status: Never Smoker   . Smokeless tobacco: Not on file  . Alcohol Use: No    OB History    Grav Para Term Preterm Abortions TAB SAB Ect Mult Living                  Review of Systems  Musculoskeletal: Positive for joint swelling.  All other systems reviewed and are negative.    Allergies  Shellfish allergy  Home Medications   Current Outpatient Rx  Name Route Sig Dispense Refill  . DARIFENACIN HYDROBROMIDE ER 7.5 MG PO TB24 Oral Take 7.5 mg by mouth daily.      Marland Kitchen ESOMEPRAZOLE MAGNESIUM 40 MG PO CPDR Oral Take 40 mg by mouth at bedtime.      Marland Kitchen FERROUS SULFATE 325 (65 FE) MG PO TABS Oral Take 325 mg by mouth 2 (two) times daily with a meal.     . IBUPROFEN 600 MG PO TABS Oral Take 600 mg by mouth every 8 (eight) hours as needed. For headaches     .  LABETALOL HCL 200 MG PO TABS Oral Take 400 mg by mouth 2 (two) times daily.      . TOPIRAMATE 200 MG PO TABS Oral Take 200 mg by mouth daily. For migraines    . ZOLPIDEM TARTRATE 10 MG PO TABS Oral Take 10 mg by mouth at bedtime. sleep      BP 155/100  Pulse 96  Temp 98.2 F (36.8 C) (Oral)  Resp 20  Ht 5\' 6"  (1.676 m)  Wt 240 lb (108.863 kg)  BMI 38.74 kg/m2  SpO2 98%  LMP 04/19/2012  Physical Exam  Nursing note and vitals reviewed. Constitutional: She appears well-developed and well-nourished.  HENT:  Head: Normocephalic and atraumatic.  Musculoskeletal: She exhibits edema and tenderness.       Swollen tender left knee,  Decreased range of motion due to pain,  nv and ns intact,    Neurological: She is alert.  Skin: Skin is warm.    ED Course  Procedures (including critical care time)  Labs Reviewed - No data to display Dg Knee 2 Views Left  04/30/2012  *RADIOLOGY REPORT*  Clinical Data: 41 year old female with knee pain.  LEFT KNEE - 1-2 VIEW  Comparison: None.  Findings: Possible trace joint effusion. Bone mineralization  is within normal limits.  Joint spaces appear preserved.  Patella intact.  No fracture dislocation.  IMPRESSION: Possible trace joint effusion, otherwise no acute findings identified about the left knee.   Original Report Authenticated By: Harley Hallmark, M.D.      No diagnosis found.    MDM  Pt advised her to see Dr. Pearletha Forge for recheck this week.  Ice to area of pain.        Lonia Skinner [place], [place] 04/30/12 2102

## 2012-05-01 NOTE — ED Provider Notes (Signed)
Medical screening examination/treatment/procedure(s) were performed by non-physician practitioner and as supervising physician I was immediately available for consultation/collaboration.   Carleene Cooper III, MD 05/01/12 1146

## 2012-05-02 ENCOUNTER — Ambulatory Visit (INDEPENDENT_AMBULATORY_CARE_PROVIDER_SITE_OTHER): Payer: BC Managed Care – PPO | Admitting: Family Medicine

## 2012-05-02 ENCOUNTER — Encounter: Payer: Self-pay | Admitting: Family Medicine

## 2012-05-02 VITALS — BP 133/95 | HR 69 | Ht 66.0 in | Wt 237.0 lb

## 2012-05-02 DIAGNOSIS — M25562 Pain in left knee: Secondary | ICD-10-CM

## 2012-05-02 DIAGNOSIS — M25569 Pain in unspecified knee: Secondary | ICD-10-CM

## 2012-05-02 NOTE — Progress Notes (Signed)
Subjective:    Patient ID: Anna [name], female    DOB: 1971-05-18, 41 y.o.   MRN: [id number]  PCP: Burnett Kanaris MD  HPI 41 yo F here for left knee pain.  Patient reports about 4 months ago started to have left knee pain but it went away. Had x-rays at that time that were normal of her left knee. Then about 1 week ago she started having swelling of left knee and difficulty walking. Progressed and a lot of problems walking on Saturday, was limping. No locking or catching. No known injury or remote injury. Takes ibuprofen during day and has been elevating knee. Takes hydrocodone in evenings.  Past Medical History  Diagnosis Date  . Migraine   . Hypertension     Current Outpatient Prescriptions on File Prior to Visit  Medication Sig Dispense Refill  . darifenacin (ENABLEX) 7.5 MG 24 hr tablet Take 7.5 mg by mouth daily.        Marland Kitchen esomeprazole (NEXIUM) 40 MG capsule Take 40 mg by mouth at bedtime.        . ferrous sulfate 325 (65 FE) MG tablet Take 325 mg by mouth 2 (two) times daily with a meal.       . HYDROcodone-acetaminophen (NORCO/VICODIN) 5-325 MG per tablet Take 2 tablets by mouth every 4 (four) hours as needed for pain.  10 tablet  0  . ibuprofen (ADVIL,MOTRIN) 800 MG tablet Take 1 tablet (800 mg total) by mouth 3 (three) times daily.  21 tablet  0  . labetalol (NORMODYNE) 200 MG tablet Take 400 mg by mouth 2 (two) times daily.        Marland Kitchen topiramate (TOPAMAX) 200 MG tablet Take 200 mg by mouth daily. For migraines      . zolpidem (AMBIEN) 10 MG tablet Take 10 mg by mouth at bedtime. sleep        Past Surgical History  Procedure Date  . Tubal ligation   . Foot surgery   . Ankle surgery   . Dilation and curettage of uterus     Allergies  Allergen Reactions  . Shellfish Allergy     Positive allergy test    History   Social History  . Marital Status: Married    Spouse Name: N/A    Number of Children: N/A  . Years of Education: N/A   Occupational  History  . Not on file.   Social History Main Topics  . Smoking status: Never Smoker   . Smokeless tobacco: Not on file  . Alcohol Use: No  . Drug Use: No  . Sexually Active: Not on file   Other Topics Concern  . Not on file   Social History Narrative  . No narrative on file    Family History  Problem Relation Age of Onset  . Hypertension Mother   . Hypertension Father   . Heart attack Father   . Diabetes Neg Hx   . Hyperlipidemia Neg Hx   . Sudden death Neg Hx     BP 133/95  Pulse 69  Ht 5\' 6"  (1.676 m)  Wt 237 lb (107.502 kg)  BMI 38.25 kg/m2  LMP 04/19/2012  Review of Systems See HPI above.    Objective:   Physical Exam Gen: NAD  L knee: No gross deformity, ecchymoses, mild swelling. TTP medial joint line.  No lat joint line, post patellar facet TTP. FROM. Negative ant/post drawers. Negative valgus/varus testing. Negative lachmanns. Mild + medial mcmurrays, apleys, negative patellar  apprehension, clarkes. NV intact distally.    Assessment & Plan:  1. Left knee pain - radiographs without DJD, only trace effusion but were not weight bearing.  History and exam suggestive that her pain is due to a degenerative medial meniscal tear.  Intraarticular injection given today.  Shown HEP.  Icing, ibuprofen, tylenol as needed for pain.  F/u in 1 month.  If not improving consider MRI.  After informed written consent, patient was seated on exam table. Left knee was prepped with alcohol swab and utilizing anterolateral approach, patient's left knee was injected intraarticularly with 3:1 marcaine: depomedrol. Patient tolerated the procedure well without immediate complications.

## 2012-05-02 NOTE — Patient Instructions (Addendum)
I'm worried you have a degenerative meniscal tear in your knee given your exam, symptoms, normal x-rays. We went ahead with cortisone injection today - you should notice benefit with this by a week from now. Ice knee 15 minutes at a time 3-4 times a day. Quad strengthening 3 sets of 10 once a day (straight leg raises, knee extensions).  Can add ankle weight if these become too easy. Formal physical therapy is an option. Ok to take ibuprofen 800mg  three times a day as needed with food. Can take tylenol with this also. Elevate above the level of your heart to help with swelling. Follow up with me in 1 month. If not improving after a month will consider MRI.

## 2012-05-08 DIAGNOSIS — M25562 Pain in left knee: Secondary | ICD-10-CM | POA: Insufficient documentation

## 2012-05-09 ENCOUNTER — Encounter: Payer: Self-pay | Admitting: Family Medicine

## 2012-05-09 NOTE — Assessment & Plan Note (Signed)
radiographs without DJD, only trace effusion but were not weight bearing.  History and exam suggestive that her pain is due to a degenerative medial meniscal tear.  Intraarticular injection given today.  Shown HEP.  Icing, ibuprofen, tylenol as needed for pain.  F/u in 1 month.  If not improving consider MRI.  After informed written consent, patient was seated on exam table. Left knee was prepped with alcohol swab and utilizing anterolateral approach, patient's left knee was injected intraarticularly with 3:1 marcaine: depomedrol. Patient tolerated the procedure well without immediate complications.

## 2012-05-18 ENCOUNTER — Ambulatory Visit (INDEPENDENT_AMBULATORY_CARE_PROVIDER_SITE_OTHER): Payer: BC Managed Care – PPO | Admitting: Family Medicine

## 2012-05-18 ENCOUNTER — Encounter: Payer: Self-pay | Admitting: Family Medicine

## 2012-05-18 VITALS — BP 123/87 | HR 108 | Ht 66.0 in | Wt 240.0 lb

## 2012-05-18 DIAGNOSIS — M25562 Pain in left knee: Secondary | ICD-10-CM

## 2012-05-18 DIAGNOSIS — M25569 Pain in unspecified knee: Secondary | ICD-10-CM

## 2012-05-18 NOTE — Progress Notes (Addendum)
Subjective:    Patient ID: Anna [name], female    DOB: 1971-02-20, 41 y.o.   MRN: [id number]  PCP: Burnett Kanaris MD  HPI  41 yo F here for f/u left knee pain.  9/24: Patient reports about 4 months ago started to have left knee pain but it went away. Had x-rays at that time that were normal of her left knee. Then about 1 week ago she started having swelling of left knee and difficulty walking. Progressed and a lot of problems walking on Saturday, was limping. No locking or catching. No known injury or remote injury. Takes ibuprofen during day and has been elevating knee. Takes hydrocodone in evenings.  10/10: Patient reports only a couple days improvement with injection into left knee. Pain persists medial left knee with radiation down leg from here. Difficulty putting weight on this leg. Still with swelling. Pain feels worse now than before last visit. Doing home exercise program.  Past Medical History  Diagnosis Date  . Migraine   . Hypertension   . GERD (gastroesophageal reflux disease)   . Anemia   . Insomnia     Current Outpatient Prescriptions on File Prior to Visit  Medication Sig Dispense Refill  . amLODipine (NORVASC) 10 MG tablet       . darifenacin (ENABLEX) 7.5 MG 24 hr tablet Take 7.5 mg by mouth daily.        Marland Kitchen esomeprazole (NEXIUM) 40 MG capsule Take 40 mg by mouth at bedtime.        Marland Kitchen Fe Cbn-Fe Gluc-FA-B12-C-DSS (FERRALET 90) 90-1 MG TABS       . ferrous sulfate 325 (65 FE) MG tablet Take 325 mg by mouth 2 (two) times daily with a meal.       . ibuprofen (ADVIL,MOTRIN) 800 MG tablet Take 1 tablet (800 mg total) by mouth 3 (three) times daily.  21 tablet  0  . labetalol (NORMODYNE) 200 MG tablet Take 400 mg by mouth 2 (two) times daily.        Marland Kitchen topiramate (TOPAMAX) 200 MG tablet Take 200 mg by mouth daily. For migraines      . zolpidem (AMBIEN) 10 MG tablet Take 10 mg by mouth at bedtime. sleep        Past Surgical History  Procedure  Date  . Tubal ligation   . Foot surgery   . Ankle surgery   . Dilation and curettage of uterus     Allergies  Allergen Reactions  . Shellfish Allergy     Positive allergy test    History   Social History  . Marital Status: Married    Spouse Name: N/A    Number of Children: N/A  . Years of Education: N/A   Occupational History  . Not on file.   Social History Main Topics  . Smoking status: Never Smoker   . Smokeless tobacco: Not on file  . Alcohol Use: No  . Drug Use: No  . Sexually Active: Not on file   Other Topics Concern  . Not on file   Social History Narrative  . No narrative on file    Family History  Problem Relation Age of Onset  . Hypertension Mother   . Hypertension Father   . Heart attack Father   . Diabetes Neg Hx   . Hyperlipidemia Neg Hx   . Sudden death Neg Hx     BP 123/87  Pulse 108  Ht 5\' 6"  (1.676 m)  Wt 240  lb (108.863 kg)  BMI 38.74 kg/m2  LMP 04/19/2012  Review of Systems  See HPI above.    Objective:   Physical Exam  Gen: NAD  L knee: No gross deformity, ecchymoses, mild swelling. TTP medial joint line.  No lat joint line, post patellar facet TTP. FROM. Negative ant/post drawers. Negative valgus/varus testing. Negative lachmanns. Mild + medial mcmurrays, apleys, negative patellar apprehension, clarkes. NV intact distally.    Assessment & Plan:  1. Left knee pain - radiographs without DJD, only trace effusion but were not weight bearing.  Injection only provided a couple days relief unfortunately.  Given pain worsening despite injection, > 4 months symptoms, not improved with nsaids will go ahead with MRI of left knee to assess for medial meniscal tear.  If confirmed will refer to ortho to discuss arthroscopy.  Addendum:  MRI results reviewed and discussed with patient.  No evidence of medial meniscal tear or other potentially surgical pathology.  She does have mild arthritis that was not visualized on radiographs but  would have expected intraarticular injection to help with this.  Offered formal PT, rx nsaid, repeat injection.  She would like to wait and discuss this with her husband before moving forward.  Reassured her regarding MRI.

## 2012-05-18 NOTE — Assessment & Plan Note (Signed)
radiographs without DJD, only trace effusion but were not weight bearing.  Injection only provided a couple days relief unfortunately.  Given pain worsening despite injection, > 4 months symptoms, not improved with nsaids will go ahead with MRI of left knee to assess for medial meniscal tear.  If confirmed will refer to ortho to discuss arthroscopy.

## 2012-05-23 ENCOUNTER — Ambulatory Visit (HOSPITAL_BASED_OUTPATIENT_CLINIC_OR_DEPARTMENT_OTHER)
Admission: RE | Admit: 2012-05-23 | Discharge: 2012-05-23 | Disposition: A | Discharge status: Home / Self Care | Payer: BC Managed Care – PPO | Source: Ambulatory Visit | Attending: Family Medicine | Admitting: Family Medicine

## 2012-05-23 DIAGNOSIS — M25569 Pain in unspecified knee: Secondary | ICD-10-CM | POA: Insufficient documentation

## 2012-05-23 DIAGNOSIS — M25562 Pain in left knee: Secondary | ICD-10-CM

## 2012-05-31 ENCOUNTER — Ambulatory Visit: Payer: BC Managed Care – PPO | Admitting: Family Medicine

## 2012-06-01 ENCOUNTER — Ambulatory Visit: Payer: BC Managed Care – PPO | Admitting: Family Medicine

## 2012-11-21 ENCOUNTER — Emergency Department (HOSPITAL_BASED_OUTPATIENT_CLINIC_OR_DEPARTMENT_OTHER)
Admission: EM | Admit: 2012-11-21 | Discharge: 2012-11-22 | Disposition: A | Discharge status: Home / Self Care | Payer: Medicaid Other | Attending: Emergency Medicine | Admitting: Emergency Medicine

## 2012-11-21 ENCOUNTER — Emergency Department (HOSPITAL_BASED_OUTPATIENT_CLINIC_OR_DEPARTMENT_OTHER): Payer: Medicaid Other

## 2012-11-21 ENCOUNTER — Encounter (HOSPITAL_BASED_OUTPATIENT_CLINIC_OR_DEPARTMENT_OTHER): Payer: Self-pay | Admitting: Emergency Medicine

## 2012-11-21 DIAGNOSIS — R110 Nausea: Secondary | ICD-10-CM | POA: Insufficient documentation

## 2012-11-21 DIAGNOSIS — G4700 Insomnia, unspecified: Secondary | ICD-10-CM | POA: Insufficient documentation

## 2012-11-21 DIAGNOSIS — Z79899 Other long term (current) drug therapy: Secondary | ICD-10-CM | POA: Insufficient documentation

## 2012-11-21 DIAGNOSIS — G43909 Migraine, unspecified, not intractable, without status migrainosus: Secondary | ICD-10-CM | POA: Insufficient documentation

## 2012-11-21 DIAGNOSIS — R197 Diarrhea, unspecified: Secondary | ICD-10-CM | POA: Insufficient documentation

## 2012-11-21 DIAGNOSIS — D649 Anemia, unspecified: Secondary | ICD-10-CM | POA: Insufficient documentation

## 2012-11-21 DIAGNOSIS — K219 Gastro-esophageal reflux disease without esophagitis: Secondary | ICD-10-CM | POA: Insufficient documentation

## 2012-11-21 DIAGNOSIS — I10 Essential (primary) hypertension: Secondary | ICD-10-CM | POA: Insufficient documentation

## 2012-11-21 LAB — BASIC METABOLIC PANEL
BUN: 18 mg/dL (ref 6–23)
Creatinine, Ser: 0.9 mg/dL (ref 0.50–1.10)
GFR calc Af Amer: 89 mL/min — ABNORMAL LOW (ref >90)
GFR calc non Af Amer: 77 mL/min — ABNORMAL LOW (ref >90)
Potassium: 3.3 mEq/L — ABNORMAL LOW (ref 3.5–5.1)

## 2012-11-21 LAB — CBC WITH DIFFERENTIAL/PLATELET
Basophils Relative: 0 % (ref 0–1)
Eosinophils Absolute: 0.1 10*3/uL (ref 0.0–0.7)
Hemoglobin: 11.6 g/dL — ABNORMAL LOW (ref 12.0–15.0)
MCH: 29.3 pg (ref 26.0–34.0)
MCHC: 34.1 g/dL (ref 30.0–36.0)
Monocytes Relative: 10 % (ref 3–12)
Neutrophils Relative %: 33 % — ABNORMAL LOW (ref 43–77)

## 2012-11-21 MED ORDER — KETOROLAC TROMETHAMINE 30 MG/ML IJ SOLN
30.0000 mg | Freq: Once | INTRAMUSCULAR | Status: AC
  Administered 2012-11-21: 30 mg via INTRAVENOUS
  Filled 2012-11-21: qty 1

## 2012-11-21 MED ORDER — GI COCKTAIL ~~LOC~~
30.0000 mL | Freq: Once | ORAL | Status: AC
  Administered 2012-11-21: 30 mL via ORAL
  Filled 2012-11-21: qty 30

## 2012-11-21 MED ORDER — KETOROLAC TROMETHAMINE 60 MG/2ML IM SOLN
60.0000 mg | Freq: Once | INTRAMUSCULAR | Status: DC
  Filled 2012-11-21: qty 2

## 2012-11-21 NOTE — ED Notes (Signed)
Pt c/o sharp central chest pain x 2 days. Pt states she felt like it was GERD but has been taking her GERD medication as directed. Pt states decreased appetite with nausea and diarrhea

## 2012-11-21 NOTE — ED Notes (Signed)
Pt placed on heart monitor by bedside

## 2012-11-21 NOTE — ED Notes (Signed)
Patient transported to X-ray 

## 2012-11-21 NOTE — ED Notes (Signed)
SR on monitor, resps even and unlabored. IV site unremarkable. Awaiting EDP eval.

## 2012-11-22 MED ORDER — SUCRALFATE 1 GM/10ML PO SUSP: 1.0000 g | Freq: Four times a day (QID) | ORAL | Status: DC

## 2012-11-22 NOTE — ED Provider Notes (Signed)
History     CSN: [id number]  Arrival date & time 11/21/12  2142   First MD Initiated Contact with Patient 11/21/12 2343      Chief Complaint  Patient presents with  . Chest Pain    (Consider location/radiation/quality/duration/timing/severity/associated sxs/prior treatment) Patient is a 42 y.o. female presenting with chest pain. The history is provided by the patient and the spouse.  Chest Pain Pain location:  Substernal area Pain quality: sharp   Pain radiates to:  Does not radiate Pain radiates to the back: no   Pain severity:  Moderate Onset quality:  Gradual Duration:  2 days Timing:  Constant Progression:  Waxing and waning Chronicity:  Recurrent Context: eating   Relieved by:  Nothing Worsened by:  Nothing tried Ineffective treatments:  Antacids Associated symptoms: nausea   Associated symptoms: no abdominal pain, no cough, no palpitations and no shortness of breath   Associated symptoms comment:  Diarrhea Risk factors: no birth control   Feels like previous GERD but nexium is helping  Past Medical History  Diagnosis Date  . Migraine   . Hypertension   . GERD (gastroesophageal reflux disease)   . Anemia   . Insomnia     Past Surgical History  Procedure Laterality Date  . Tubal ligation    . Foot surgery    . Ankle surgery    . Dilation and curettage of uterus      Family History  Problem Relation Age of Onset  . Hypertension Mother   . Hypertension Father   . Heart attack Father   . Diabetes Neg Hx   . Hyperlipidemia Neg Hx   . Sudden death Neg Hx     History  Substance Use Topics  . Smoking status: Never Smoker   . Smokeless tobacco: Not on file  . Alcohol Use: No    OB History   Grav Para Term Preterm Abortions TAB SAB Ect Mult Living                  Review of Systems  Respiratory: Negative for cough, chest tightness, shortness of breath and wheezing.   Cardiovascular: Positive for chest pain. Negative for palpitations and leg  swelling.  Gastrointestinal: Positive for nausea and diarrhea. Negative for abdominal pain.  All other systems reviewed and are negative.    Allergies  Shellfish allergy  Home Medications   Current Outpatient Rx  Name  Route  Sig  Dispense  Refill  . amLODipine (NORVASC) 10 MG tablet               . cyclobenzaprine (FLEXERIL) 10 MG tablet               . darifenacin (ENABLEX) 7.5 MG 24 hr tablet   Oral   Take 7.5 mg by mouth daily.           Marland Kitchen esomeprazole (NEXIUM) 40 MG capsule   Oral   Take 40 mg by mouth at bedtime.           Marland Kitchen Fe Cbn-Fe Gluc-FA-B12-C-DSS (FERRALET 90) 90-1 MG TABS               . ferrous sulfate 325 (65 FE) MG tablet   Oral   Take 325 mg by mouth 2 (two) times daily with a meal.          . ibuprofen (ADVIL,MOTRIN) 800 MG tablet   Oral   Take 1 tablet (800 mg total) by mouth 3 (three) times  daily.   21 tablet   0   . labetalol (NORMODYNE) 200 MG tablet   Oral   Take 400 mg by mouth 2 (two) times daily.           Marland Kitchen topiramate (TOPAMAX) 200 MG tablet   Oral   Take 200 mg by mouth daily. For migraines         . zolpidem (AMBIEN) 10 MG tablet   Oral   Take 10 mg by mouth at bedtime. sleep           BP 123/81  Pulse 86  Temp(Src) 98.5 F (36.9 C) (Oral)  Resp 22  Ht 5\' 6"  (1.676 m)  Wt 260 lb (117.935 kg)  BMI 41.99 kg/m2  SpO2 98%  Physical Exam  Constitutional: She is oriented to person, place, and time. She appears well-developed and well-nourished.  HENT:  Mouth/Throat: Oropharynx is clear and moist.  Eyes: Conjunctivae are normal. Pupils are equal, round, and reactive to light.  Neck: Normal range of motion. Neck supple.  Cardiovascular: Normal rate, regular rhythm and intact distal pulses.   Pulmonary/Chest: Effort normal and breath sounds normal. She has no wheezes. She has no rales.  Abdominal: Soft. Bowel sounds are normal. There is no tenderness. There is no rebound and no guarding.   Musculoskeletal: Normal range of motion.  Neurological: She is alert and oriented to person, place, and time.  Skin: Skin is warm and dry.  Psychiatric: She has a normal mood and affect.    ED Course  Procedures (including critical care time)  Labs Reviewed  CBC WITH DIFFERENTIAL - Abnormal; Notable for the following:    Hemoglobin 11.6 (*)    HCT 34.0 (*)    Neutrophils Relative 33 (*)    Lymphocytes Relative 55 (*)    All other components within normal limits  BASIC METABOLIC PANEL - Abnormal; Notable for the following:    Potassium 3.3 (*)    Glucose, Bld 109 (*)    GFR calc non Af Amer 77 (*)    GFR calc Af Amer 89 (*)    All other components within normal limits  TROPONIN I   Dg Chest 2 View  11/21/2012  *RADIOLOGY REPORT*  Clinical Data: Chest pain.  CHEST - 2 VIEW  Comparison: None.  Findings: Cardiomediastinal silhouette appears normal.  No acute pulmonary disease is noted.  Bony thorax is intact.  IMPRESSION: No acute cardiopulmonary abnormality seen.   Original Report Authenticated By: Lupita Raider.,  M.D.      No diagnosis found.   PERC negative, wells 0  MDM   Date: 11/22/2012  Rate: 76  Rhythm: normal sinus rhythm  QRS Axis: normal  Intervals: normal  ST/T Wave abnormalities: normal  Conduction Disutrbances: none  Narrative Interpretation: unremarkable    In the setting of negative EKG and negative troponin and > 8 hours of continuous symptoms ACS is excluded.  Sx resolved with gi cocktail.  Follow up with PMD return for worsening symptoms       Destyn Parfitt K Helaman Mecca-Rasch, MD 11/22/12 [phone]

## 2012-11-22 NOTE — ED Notes (Signed)
Pt states that she is feeling better now and is ready to go home. Vital signs stable.  MD informed. To be discharged shortly.

## 2013-01-10 ENCOUNTER — Emergency Department (HOSPITAL_BASED_OUTPATIENT_CLINIC_OR_DEPARTMENT_OTHER): Payer: Medicaid Other

## 2013-01-10 ENCOUNTER — Encounter (HOSPITAL_BASED_OUTPATIENT_CLINIC_OR_DEPARTMENT_OTHER): Payer: Self-pay | Admitting: Emergency Medicine

## 2013-01-10 ENCOUNTER — Emergency Department (HOSPITAL_BASED_OUTPATIENT_CLINIC_OR_DEPARTMENT_OTHER)
Admission: EM | Admit: 2013-01-10 | Discharge: 2013-01-10 | Disposition: A | Discharge status: Home / Self Care | Payer: Medicaid Other | Attending: Emergency Medicine | Admitting: Emergency Medicine

## 2013-01-10 DIAGNOSIS — M25579 Pain in unspecified ankle and joints of unspecified foot: Secondary | ICD-10-CM | POA: Insufficient documentation

## 2013-01-10 DIAGNOSIS — Z79899 Other long term (current) drug therapy: Secondary | ICD-10-CM | POA: Insufficient documentation

## 2013-01-10 DIAGNOSIS — G43909 Migraine, unspecified, not intractable, without status migrainosus: Secondary | ICD-10-CM | POA: Insufficient documentation

## 2013-01-10 DIAGNOSIS — M25571 Pain in right ankle and joints of right foot: Secondary | ICD-10-CM

## 2013-01-10 DIAGNOSIS — Z8781 Personal history of (healed) traumatic fracture: Secondary | ICD-10-CM | POA: Insufficient documentation

## 2013-01-10 DIAGNOSIS — D649 Anemia, unspecified: Secondary | ICD-10-CM | POA: Insufficient documentation

## 2013-01-10 DIAGNOSIS — G4700 Insomnia, unspecified: Secondary | ICD-10-CM | POA: Insufficient documentation

## 2013-01-10 DIAGNOSIS — I10 Essential (primary) hypertension: Secondary | ICD-10-CM | POA: Insufficient documentation

## 2013-01-10 DIAGNOSIS — K219 Gastro-esophageal reflux disease without esophagitis: Secondary | ICD-10-CM | POA: Insufficient documentation

## 2013-01-10 MED ORDER — IBUPROFEN 800 MG PO TABS: 800.0000 mg | ORAL_TABLET | Freq: Three times a day (TID) | ORAL | Status: DC

## 2013-01-10 MED ORDER — HYDROCODONE-ACETAMINOPHEN 5-325 MG PO TABS: 2.0000 | ORAL_TABLET | ORAL | Status: DC | PRN

## 2013-01-10 NOTE — ED Notes (Signed)
Patient transported to X-ray 

## 2013-01-10 NOTE — ED Provider Notes (Signed)
History  This chart was scribed for Anna Octave, MD by Bennett Scrape, ED Scribe. This patient was seen in room MH03/MH03 and the patient's care was started at 7:37 PM.  CSN: [id number]  Arrival date & time 01/10/13  4098   First MD Initiated Contact with Patient 01/10/13 1937      Chief Complaint  Patient presents with  . Ankle Pain     The history is provided by the patient. No language interpreter was used.    HPI Comments: Anna [name] is a 42 y.o. female who presents to the Emergency Department complaining of one week of gradual onset, gradually worsening, constant right ankle pain. the pain is worse with ambulation. She denies any recent falls or injuries but thinks that she may have "stepped sideways" once. She reports that she has been taking Aleve and tylenol with mild improvement. She states that she broke the same ankle 10 years ago but denies having any hardware in place. She has a h/o HTN, GERD and migraines. Pt denies smoking and alcohol use. She denies having a h/o PE/DVT.  Past Medical History  Diagnosis Date  . Migraine   . Hypertension   . GERD (gastroesophageal reflux disease)   . Anemia   . Insomnia     Past Surgical History  Procedure Laterality Date  . Tubal ligation    . Foot surgery    . Ankle surgery    . Dilation and curettage of uterus      Family History  Problem Relation Age of Onset  . Hypertension Mother   . Hypertension Father   . Heart attack Father   . Diabetes Neg Hx   . Hyperlipidemia Neg Hx   . Sudden death Neg Hx     History  Substance Use Topics  . Smoking status: Never Smoker   . Smokeless tobacco: Not on file  . Alcohol Use: No    No OB history provided.  Review of Systems  A complete 10 system review of systems was obtained and all systems are negative except as noted in the HPI and PMH.   Allergies  Shellfish allergy  Home Medications   Current Outpatient Rx  Name  Route  Sig  Dispense  Refill   . amLODipine (NORVASC) 10 MG tablet               . cyclobenzaprine (FLEXERIL) 10 MG tablet               . darifenacin (ENABLEX) 7.5 MG 24 hr tablet   Oral   Take 7.5 mg by mouth daily.           Marland Kitchen esomeprazole (NEXIUM) 40 MG capsule   Oral   Take 40 mg by mouth at bedtime.           Marland Kitchen Fe Cbn-Fe Gluc-FA-B12-C-DSS (FERRALET 90) 90-1 MG TABS               . ferrous sulfate 325 (65 FE) MG tablet   Oral   Take 325 mg by mouth 2 (two) times daily with a meal.          . ibuprofen (ADVIL,MOTRIN) 800 MG tablet   Oral   Take 1 tablet (800 mg total) by mouth 3 (three) times daily.   21 tablet   0   . labetalol (NORMODYNE) 200 MG tablet   Oral   Take 400 mg by mouth 2 (two) times daily.           Marland Kitchen  sucralfate (CARAFATE) 1 GM/10ML suspension   Oral   Take 10 mLs (1 g total) by mouth 4 (four) times daily.   420 mL   0   . topiramate (TOPAMAX) 200 MG tablet   Oral   Take 200 mg by mouth daily. For migraines         . zolpidem (AMBIEN) 10 MG tablet   Oral   Take 10 mg by mouth at bedtime. sleep           Triage Vitals: BP 121/84  Pulse 85  Temp(Src) 98.5 F (36.9 C) (Oral)  Ht 5\' 6"  (1.676 m)  Wt 260 lb (117.935 kg)  BMI 41.99 kg/m2  SpO2 100%  Physical Exam  Nursing note and vitals reviewed. Constitutional: She is oriented to person, place, and time. She appears well-developed and well-nourished. No distress.  HENT:  Head: Normocephalic and atraumatic.  Eyes: EOM are normal.  Neck: Neck supple. No tracheal deviation present.  Cardiovascular: Normal rate and regular rhythm.   Pulmonary/Chest: Effort normal and breath sounds normal. No respiratory distress.  Abdominal: Soft. There is no tenderness.  Musculoskeletal: Normal range of motion.  Swelling over the right lateral malleolus with tenderness, 2+ DP and PT pulses are intact, achilles tendon is intact, no calf swelling, no proximal tibia tenderness  Neurological: She is alert and  oriented to person, place, and time.  Skin: Skin is warm and dry.  Psychiatric: She has a normal mood and affect. Her behavior is normal.    ED Course  Procedures (including critical care time)  DIAGNOSTIC STUDIES: Oxygen Saturation is 100% on room air, normal by my interpretation.    COORDINATION OF CARE: 7:45 PM-Discussed treatment plan which includes xray of the right ankle with pt at bedside and pt agreed to plan.   8:29 PM-Informed pt of negative radiology results. Discussed discharge plan with pt and pt agreed to plan. Also advised pt to follow up as needed and pt agreed. Addressed symptoms to return for with pt.   Labs Reviewed - No data to display  Dg Ankle Complete Left  01/10/2013   *RADIOLOGY REPORT*  Clinical Data: Ankle pain.  LEFT ANKLE COMPLETE - 3+ VIEW  Comparison: Right ankle performed today.  Findings: Plantar calcaneal spur. No acute bony abnormality. Specifically, no fracture, subluxation, or dislocation.  Soft tissues are intact. Joint spaces are maintained.  Normal bone mineralization.  IMPRESSION: No acute bony abnormality.   Original Report Authenticated By: Charlett Nose, M.D.   Dg Ankle Complete Right  01/10/2013   *RADIOLOGY REPORT*  Clinical Data: Ankle pain.  RIGHT ANKLE - COMPLETE 3+ VIEW  Comparison: Left ankle performed today.  Findings: Plate and screw fixation device noted in the distal right fibula.  Probable old small avulsed fragment off the medial malleolus.  Small plantar calcaneal spur.  No acute fracture, subluxation or dislocation.  IMPRESSION: No acute bony abnormality.   Original Report Authenticated By: Charlett Nose, M.D.     No diagnosis found.    MDM  One week of gradually worsening right ankle pain and swelling. History of fracture 10 years ago with surgical repair. Denies any new trauma. No weakness, numbness, tingling.  X-rays negative for fracture. Hardware in place. We'll give ASO for comfort and followup with orthopedics. Supportive  care with anti-inflammatories, ice and elevation discussed.  I personally performed the services described in this documentation, which was scribed in my presence. The recorded information has been reviewed and is accurate.  Anna Octave, MD 01/10/13 2151

## 2013-01-10 NOTE — ED Notes (Signed)
Pt c/o right ankle pain. No known injury. Pt has hx of previous right ankle fx.

## 2013-01-11 ENCOUNTER — Encounter: Payer: Self-pay | Admitting: Family Medicine

## 2013-01-11 ENCOUNTER — Ambulatory Visit (INDEPENDENT_AMBULATORY_CARE_PROVIDER_SITE_OTHER): Payer: Medicaid Other | Admitting: Family Medicine

## 2013-01-11 VITALS — BP 117/87 | HR 87 | Ht 66.0 in | Wt 260.0 lb

## 2013-01-11 DIAGNOSIS — M25571 Pain in right ankle and joints of right foot: Secondary | ICD-10-CM

## 2013-01-11 DIAGNOSIS — M25579 Pain in unspecified ankle and joints of unspecified foot: Secondary | ICD-10-CM

## 2013-01-11 MED ORDER — HYDROCODONE-ACETAMINOPHEN 5-325 MG PO TABS: 1.0000 | ORAL_TABLET | Freq: Four times a day (QID) | ORAL | Status: DC | PRN

## 2013-01-11 MED ORDER — DICLOFENAC SODIUM 75 MG PO TBEC: 75.0000 mg | DELAYED_RELEASE_TABLET | Freq: Two times a day (BID) | ORAL | Status: DC

## 2013-01-11 NOTE — Patient Instructions (Addendum)
You did not break your right ankle. Walking boot or cast for next 2 weeks every time you're up and walking around. Elevate above the level of your heart as much as possible. Voltaren twice a day with food for pain and inflammation. Norco as needed for severe pain in addition to this. We will send you to rheumatology for polyarthralgias for an evaluation. Follow up with me in 2 weeks.

## 2013-01-12 ENCOUNTER — Encounter: Payer: Self-pay | Admitting: Family Medicine

## 2013-01-12 DIAGNOSIS — M25571 Pain in right ankle and joints of right foot: Secondary | ICD-10-CM | POA: Insufficient documentation

## 2013-01-12 NOTE — Progress Notes (Signed)
Patient ID: Anna [name], female   DOB: May 07, 1971, 42 y.o.   MRN: [id number]  PCP: Rodney Booze  Subjective:   HPI: Patient is a 42 y.o. female here for right ankle pain.  Patient reports without an injury she started to develop right ankle pain in past month. Has a remote fracture here 12 years ago with plate and screws. Pain started medially around to heel and progressed since then to involve entire ankle. She is wondering if maybe she injured it when she slipped and fell up a hill but did not have an obvious injury then. Difficulty walking due to pain. Had similar symptoms with her left knee - came on without an injury - that pain went away but then came back. Has had prior issues with mid-upper back that seems muscular at times. Has occasional arthralgias elsewhere but nothing consistent. Radiographs with no new fractures (an old medial malleolus avulsion, hardware laterally looks normal without arthritis). Wearing an ASO. No prior h/o gout, rheumatoid arthritis.  No FH arthritis.  Past Medical History  Diagnosis Date  . Migraine   . Hypertension   . GERD (gastroesophageal reflux disease)   . Anemia   . Insomnia     Current Outpatient Prescriptions on File Prior to Visit  Medication Sig Dispense Refill  . amLODipine (NORVASC) 10 MG tablet       . darifenacin (ENABLEX) 7.5 MG 24 hr tablet Take 7.5 mg by mouth daily.        Marland Kitchen esomeprazole (NEXIUM) 40 MG capsule Take 40 mg by mouth at bedtime.        Marland Kitchen Fe Cbn-Fe Gluc-FA-B12-C-DSS (FERRALET 90) 90-1 MG TABS       . ferrous sulfate 325 (65 FE) MG tablet Take 325 mg by mouth 2 (two) times daily with a meal.       . labetalol (NORMODYNE) 200 MG tablet Take 400 mg by mouth 2 (two) times daily.        . sucralfate (CARAFATE) 1 GM/10ML suspension Take 10 mLs (1 g total) by mouth 4 (four) times daily.  420 mL  0  . topiramate (TOPAMAX) 200 MG tablet Take 200 mg by mouth daily. For migraines      . zolpidem  (AMBIEN) 10 MG tablet Take 10 mg by mouth at bedtime. sleep       No current facility-administered medications on file prior to visit.    Past Surgical History  Procedure Laterality Date  . Tubal ligation    . Foot surgery    . Ankle surgery    . Dilation and curettage of uterus      Allergies  Allergen Reactions  . Shellfish Allergy     Positive allergy test    History   Social History  . Marital Status: Married    Spouse Name: N/A    Number of Children: N/A  . Years of Education: N/A   Occupational History  . Not on file.   Social History Main Topics  . Smoking status: Never Smoker   . Smokeless tobacco: Not on file  . Alcohol Use: No  . Drug Use: No  . Sexually Active: Not on file   Other Topics Concern  . Not on file   Social History Narrative  . No narrative on file    Family History  Problem Relation Age of Onset  . Hypertension Mother   . Hypertension Father   . Heart attack Father   . Diabetes Neg Hx   .  Hyperlipidemia Neg Hx   . Sudden death Neg Hx     BP 117/87  Pulse 87  Ht 5\' 6"  (1.676 m)  Wt 260 lb (117.935 kg)  BMI 41.99 kg/m2  Review of Systems: See HPI above.    Objective:  Physical Exam:  Gen: NAD  R ankle: Mild soft tissue swelling throughout.  Healed lateral ankle scar.  No bruising, other deformities. FROM with 5/5 strength all directions - pain with all motions though. TTP medial through lateral ankle.  Mild lateral malleolus tenderness.  No navicular, base 5th, medial malleolus tenderness. Negative ant drawer and talar tilt.   Negative syndesmotic compression. Thompsons test negative. NV intact distally.    Assessment & Plan:  1. Right ankle pain - Uncertain diagnosis.  Radiographs negative for new fracture - no tenderness where she has the old medial malleolus avulsion.  Start with immobilization - offered casting vs cam walker - chose the cam walker.  Voltaren with norco as needed.  Given her left knee pain , right  ankle pain both occurring without injury, having associated swelling, other arthralgias/myalgias, offered rheumatology evaluation - she would like to see them so will put in referral.  F/u in 2 weeks for reevaluation.

## 2013-01-12 NOTE — Assessment & Plan Note (Signed)
Uncertain diagnosis.  Radiographs negative for new fracture - no tenderness where she has the old medial malleolus avulsion.  Start with immobilization - offered casting vs cam walker - chose the cam walker.  Voltaren with norco as needed.  Given her left knee pain , right ankle pain both occurring without injury, having associated swelling, other arthralgias/myalgias, offered rheumatology evaluation - she would like to see them so will put in referral.  F/u in 2 weeks for reevaluation.

## 2013-01-25 ENCOUNTER — Ambulatory Visit (INDEPENDENT_AMBULATORY_CARE_PROVIDER_SITE_OTHER): Payer: Medicaid Other | Admitting: Family Medicine

## 2013-01-25 ENCOUNTER — Encounter: Payer: Self-pay | Admitting: Family Medicine

## 2013-01-25 VITALS — BP 114/79 | HR 80 | Ht 66.0 in | Wt 260.0 lb

## 2013-01-25 DIAGNOSIS — M25579 Pain in unspecified ankle and joints of unspecified foot: Secondary | ICD-10-CM

## 2013-01-25 DIAGNOSIS — M25571 Pain in right ankle and joints of right foot: Secondary | ICD-10-CM

## 2013-01-25 MED ORDER — PREDNISONE (PAK) 10 MG PO TABS: ORAL_TABLET | ORAL | Status: DC

## 2013-01-25 MED ORDER — HYDROCODONE-ACETAMINOPHEN 5-325 MG PO TABS: 1.0000 | ORAL_TABLET | Freq: Four times a day (QID) | ORAL | Status: DC | PRN

## 2013-01-25 NOTE — Progress Notes (Addendum)
Patient ID: Anna [name], female   DOB: 1971/01/21, 42 y.o.   MRN: [id number]  PCP: Rodney Booze  Subjective:   HPI: Patient is a 42 y.o. female here for f/u right ankle pain.  6/5: Patient reports without an injury she started to develop right ankle pain in past month. Has a remote fracture here 12 years ago with plate and screws. Pain started medially around to heel and progressed since then to involve entire ankle. She is wondering if maybe she injured it when she slipped and fell up a hill but did not have an obvious injury then. Difficulty walking due to pain. Had similar symptoms with her left knee - came on without an injury - that pain went away but then came back. Has had prior issues with mid-upper back that seems muscular at times. Has occasional arthralgias elsewhere but nothing consistent. Radiographs with no new fractures (an old medial malleolus avulsion, hardware laterally looks normal without arthritis). Wearing an ASO. No prior h/o gout, rheumatoid arthritis.  No FH arthritis.  6/19: Patient returns stating pain is about the same compared to last visit. Wearing cam walker regularly. Taking norco as needed for pain. Out of work as no job available with current restrictions. Pain throughout anterior ankle. Icing ankle as well.  Past Medical History  Diagnosis Date  . Migraine   . Hypertension   . GERD (gastroesophageal reflux disease)   . Anemia   . Insomnia     Current Outpatient Prescriptions on File Prior to Visit  Medication Sig Dispense Refill  . amLODipine (NORVASC) 10 MG tablet       . darifenacin (ENABLEX) 7.5 MG 24 hr tablet Take 7.5 mg by mouth daily.        . diclofenac (VOLTAREN) 75 MG EC tablet Take 1 tablet (75 mg total) by mouth 2 (two) times daily.  60 tablet  1  . esomeprazole (NEXIUM) 40 MG capsule Take 40 mg by mouth at bedtime.        Marland Kitchen Fe Cbn-Fe Gluc-FA-B12-C-DSS (FERRALET 90) 90-1 MG TABS       . ferrous sulfate  325 (65 FE) MG tablet Take 325 mg by mouth 2 (two) times daily with a meal.       . labetalol (NORMODYNE) 200 MG tablet Take 400 mg by mouth 2 (two) times daily.        . sucralfate (CARAFATE) 1 GM/10ML suspension Take 10 mLs (1 g total) by mouth 4 (four) times daily.  420 mL  0  . topiramate (TOPAMAX) 200 MG tablet Take 200 mg by mouth daily. For migraines      . zolpidem (AMBIEN) 10 MG tablet Take 10 mg by mouth at bedtime. sleep       No current facility-administered medications on file prior to visit.    Past Surgical History  Procedure Laterality Date  . Tubal ligation    . Foot surgery    . Ankle surgery    . Dilation and curettage of uterus      Allergies  Allergen Reactions  . Shellfish Allergy     Positive allergy test    History   Social History  . Marital Status: Married    Spouse Name: N/A    Number of Children: N/A  . Years of Education: N/A   Occupational History  . Not on file.   Social History Main Topics  . Smoking status: Never Smoker   . Smokeless tobacco: Not on file  .  Alcohol Use: No  . Drug Use: No  . Sexually Active: Not on file   Other Topics Concern  . Not on file   Social History Narrative  . No narrative on file    Family History  Problem Relation Age of Onset  . Hypertension Mother   . Hypertension Father   . Heart attack Father   . Diabetes Neg Hx   . Hyperlipidemia Neg Hx   . Sudden death Neg Hx     BP 114/79  Pulse 80  Ht 5\' 6"  (1.676 m)  Wt 260 lb (117.935 kg)  BMI 41.99 kg/m2  Review of Systems: See HPI above.    Objective:  Physical Exam:  Gen: NAD  R ankle: Mild soft tissue swelling throughout.  Healed lateral ankle scar.  No bruising, other deformities. FROM with 5/5 strength all directions - pain with all motions though. TTP medial through medial to lateral ankle joint anteriorly.  No focal lateral malleolus, navicular, base 5th, medial malleolus tenderness. Negative ant drawer and talar tilt.    Negative syndesmotic compression. Thompsons test negative. NV intact distally.    Assessment & Plan:  1. Right ankle pain - Uncertain diagnosis.  Radiographs were negative for new fracture - no tenderness where she has the old medial malleolus avulsion.  Still with similar pain 2 weeks out from immobilization.  Continue cam walker as needed.  Start PT and home exercise program.  Trial prednisone dose pack for pain, swelling, inflammation.  Norco as needed for severe pain.  Given her left knee pain, right ankle pain both occurring without injury, having associated swelling, other arthralgias/myalgias, will go ahead with rheumatology.  F/u in 4 weeks.  Addendum:  Patient reported her paperwork was incorrect - reports she believes prior to her visit to ED and [place] that she stepped sideways and fell down she believes on 6/4.  Paperwork needs to be fixed to reflect this.  Will be corrected, scanned into chart as well.

## 2013-01-25 NOTE — Assessment & Plan Note (Signed)
Uncertain diagnosis.  Radiographs were negative for new fracture - no tenderness where she has the old medial malleolus avulsion.  Still with similar pain 2 weeks out from immobilization.  Continue cam walker as needed.  Start PT and home exercise program.  Trial prednisone dose pack for pain, swelling, inflammation.  Norco as needed for severe pain.  Given her left knee pain, right ankle pain both occurring without injury, having associated swelling, other arthralgias/myalgias, will go ahead with rheumatology.  Continue current work restrictions.  F/u in 4 weeks.

## 2013-01-25 NOTE — Patient Instructions (Addendum)
Do the 1 visit of physical therapy you're allowed and do home exercises they give you daily. Use cam walker as needed. Icing 15 minutes at a time as needed for pain. Try prednisone dose pack x 6 days. Don't take aleve or ibuprofen when on prednisone. Norco as needed for severe pain. We will refer you to rheumatology for evaluation. Follow up with me in 4 weeks.

## 2013-02-01 ENCOUNTER — Ambulatory Visit: Payer: Medicaid Other | Attending: Family Medicine | Admitting: Rehabilitation

## 2013-02-01 DIAGNOSIS — M25579 Pain in unspecified ankle and joints of unspecified foot: Secondary | ICD-10-CM | POA: Insufficient documentation

## 2013-02-01 DIAGNOSIS — IMO0001 Reserved for inherently not codable concepts without codable children: Secondary | ICD-10-CM | POA: Insufficient documentation

## 2013-02-22 ENCOUNTER — Encounter: Payer: Self-pay | Admitting: Family Medicine

## 2013-02-22 ENCOUNTER — Ambulatory Visit (INDEPENDENT_AMBULATORY_CARE_PROVIDER_SITE_OTHER): Payer: Medicaid Other | Admitting: Family Medicine

## 2013-02-22 VITALS — BP 120/86 | HR 88 | Ht 66.0 in | Wt 270.0 lb

## 2013-02-22 DIAGNOSIS — M25571 Pain in right ankle and joints of right foot: Secondary | ICD-10-CM

## 2013-02-22 DIAGNOSIS — M25579 Pain in unspecified ankle and joints of unspecified foot: Secondary | ICD-10-CM

## 2013-02-22 MED ORDER — TRAMADOL HCL 50 MG PO TABS: 50.0000 mg | ORAL_TABLET | Freq: Three times a day (TID) | ORAL | Status: DC | PRN

## 2013-02-27 ENCOUNTER — Encounter: Payer: Self-pay | Admitting: Family Medicine

## 2013-02-27 NOTE — Assessment & Plan Note (Signed)
Uncertain diagnosis.  Radiographs were negative for new fracture - no tenderness where she has the old medial malleolus avulsion.  Despite immobilization, cam walker, PT, HEP, prednisone, norco she continues to have issues with ankle and other joint complaints (left knee, right ankle, myalgias).  Again waiting on rheumatology evaluation before proceeding with further recommendations.

## 2013-02-27 NOTE — Progress Notes (Signed)
Patient ID: Anna [name], female   DOB: [birth year], 42 y.o.   MRN: [id number]  PCP: Rodney Booze  Subjective:   HPI: Patient is a 42 y.o. female here for f/u right ankle pain.  6/5: Patient reports without an injury she started to develop right ankle pain in past month. Has a remote fracture here 12 years ago with plate and screws. Pain started medially around to heel and progressed since then to involve entire ankle. She is wondering if maybe she injured it when she slipped and fell up a hill but did not have an obvious injury then. Difficulty walking due to pain. Had similar symptoms with her left knee - came on without an injury - that pain went away but then came back. Has had prior issues with mid-upper back that seems muscular at times. Has occasional arthralgias elsewhere but nothing consistent. Radiographs with no new fractures (an old medial malleolus avulsion, hardware laterally looks normal without arthritis). Wearing an ASO. No prior h/o gout, rheumatoid arthritis.  No FH arthritis.  6/19: Patient returns stating pain is about the same compared to last visit. Wearing cam walker regularly. Taking norco as needed for pain. Out of work as no job available with current restrictions. Pain throughout anterior ankle. Icing ankle as well.  7/17: Patient reports no improvement from last visit. Still using cam walker. Tried voltaren, norco, prednisone previously. Placed on light duty but none available. To date unable to get in with a rheumatologist for evaluation. Doing HEP and tried PT as well.  Past Medical History  Diagnosis Date  . Migraine   . Hypertension   . GERD (gastroesophageal reflux disease)   . Anemia   . Insomnia     Current Outpatient Prescriptions on File Prior to Visit  Medication Sig Dispense Refill  . amLODipine (NORVASC) 10 MG tablet       . darifenacin (ENABLEX) 7.5 MG 24 hr tablet Take 7.5 mg by mouth daily.        Marland Kitchen  esomeprazole (NEXIUM) 40 MG capsule Take 40 mg by mouth at bedtime.        Marland Kitchen Fe Cbn-Fe Gluc-FA-B12-C-DSS (FERRALET 90) 90-1 MG TABS       . ferrous sulfate 325 (65 FE) MG tablet Take 325 mg by mouth 2 (two) times daily with a meal.       . labetalol (NORMODYNE) 200 MG tablet Take 400 mg by mouth 2 (two) times daily.        . sucralfate (CARAFATE) 1 GM/10ML suspension Take 10 mLs (1 g total) by mouth 4 (four) times daily.  420 mL  0  . topiramate (TOPAMAX) 200 MG tablet Take 200 mg by mouth daily. For migraines      . zolpidem (AMBIEN) 10 MG tablet Take 10 mg by mouth at bedtime. sleep       No current facility-administered medications on file prior to visit.    Past Surgical History  Procedure Laterality Date  . Tubal ligation    . Foot surgery    . Ankle surgery    . Dilation and curettage of uterus      Allergies  Allergen Reactions  . Shellfish Allergy     Positive allergy test    History   Social History  . Marital Status: Married    Spouse Name: N/A    Number of Children: N/A  . Years of Education: N/A   Occupational History  . Not on file.   Social History  Main Topics  . Smoking status: Never Smoker   . Smokeless tobacco: Not on file  . Alcohol Use: No  . Drug Use: No  . Sexually Active: Not on file   Other Topics Concern  . Not on file   Social History Narrative  . No narrative on file    Family History  Problem Relation Age of Onset  . Hypertension Mother   . Hypertension Father   . Heart attack Father   . Diabetes Neg Hx   . Hyperlipidemia Neg Hx   . Sudden death Neg Hx     BP 120/86  Pulse 88  Ht 5\' 6"  (1.676 m)  Wt 270 lb (122.471 kg)  BMI 43.6 kg/m2  Review of Systems: See HPI above.    Objective:  Physical Exam:  Gen: NAD  R ankle: Mild soft tissue swelling throughout.  Healed lateral ankle scar.  No bruising, other deformities. FROM with 5/5 strength all directions - pain with all motions though. TTP medial through to lateral  ankle joint anteriorly.  No focal lateral malleolus, navicular, base 5th, medial malleolus tenderness. Negative ant drawer and talar tilt.   Negative syndesmotic compression. Thompsons test negative. NV intact distally.    Assessment & Plan:  1. Right ankle pain - Uncertain diagnosis.  Radiographs were negative for new fracture - no tenderness where she has the old medial malleolus avulsion.  Despite immobilization, cam walker, PT, HEP, prednisone, norco she continues to have issues with ankle and other joint complaints (left knee, right ankle, myalgias).  Again waiting on rheumatology evaluation before proceeding with further recommendations.

## 2013-07-30 ENCOUNTER — Encounter (INDEPENDENT_AMBULATORY_CARE_PROVIDER_SITE_OTHER): Payer: Self-pay

## 2013-07-30 ENCOUNTER — Encounter: Payer: Self-pay | Admitting: Family Medicine

## 2013-07-30 ENCOUNTER — Ambulatory Visit (INDEPENDENT_AMBULATORY_CARE_PROVIDER_SITE_OTHER): Payer: Medicaid Other | Admitting: Family Medicine

## 2013-07-30 VITALS — BP 123/84 | HR 81 | Ht 66.0 in | Wt 272.0 lb

## 2013-07-30 DIAGNOSIS — M722 Plantar fascial fibromatosis: Secondary | ICD-10-CM

## 2013-07-30 NOTE — Patient Instructions (Signed)
You have plantar fasciitis Take tylenol or aleve as needed for pain  Plantar fascia stretch for 20-30 seconds (do 3 of these) in morning Lowering/raise on a step exercises 3 x 10 once or twice a day - this is very important for long term recovery. Can add heel walks, toe walks forward and backward as well Ice heel for 15 minutes as needed. Avoid flat shoes/barefoot walking as much as possible. Arch straps have been shown to help with pain. Orthotics with heel lift may be helpful - try dr scholls active series insoles. Physical therapy is also an option. Follow up with me as needed. These usually take months to resolve.

## 2013-07-31 ENCOUNTER — Encounter: Payer: Self-pay | Admitting: Family Medicine

## 2013-07-31 DIAGNOSIS — M722 Plantar fascial fibromatosis: Secondary | ICD-10-CM | POA: Insufficient documentation

## 2013-07-31 NOTE — Assessment & Plan Note (Signed)
shown home exercise program and stretches to do daily.  Arch binders.  Discussed otc orthotics as well.  Icing, tylenol/nsaids as needed.  Injection a consideration if not improving.  Consider PT also.

## 2013-07-31 NOTE — Progress Notes (Signed)
Patient ID: Anna [name], female   DOB: 1970-12-01, 42 y.o.   MRN: [id number]  PCP: Rodney Booze  Subjective:   HPI: Patient is a 42 y.o. female here for right foot pain.  Patient reports she saw Rheumatology for first time today. Here now for plantar right foot pain. States for past several weeks she has had plantar right foot pain. Worse with walking, driving. Some swelling. No bruising. No known injury.  Past Medical History  Diagnosis Date  . Migraine   . Hypertension   . GERD (gastroesophageal reflux disease)   . Anemia   . Insomnia     Current Outpatient Prescriptions on File Prior to Visit  Medication Sig Dispense Refill  . amLODipine (NORVASC) 10 MG tablet       . darifenacin (ENABLEX) 7.5 MG 24 hr tablet Take 7.5 mg by mouth daily.        Marland Kitchen esomeprazole (NEXIUM) 40 MG capsule Take 40 mg by mouth at bedtime.        Marland Kitchen Fe Cbn-Fe Gluc-FA-B12-C-DSS (FERRALET 90) 90-1 MG TABS       . ferrous sulfate 325 (65 FE) MG tablet Take 325 mg by mouth 2 (two) times daily with a meal.       . labetalol (NORMODYNE) 200 MG tablet Take 400 mg by mouth 2 (two) times daily.        . sucralfate (CARAFATE) 1 GM/10ML suspension Take 10 mLs (1 g total) by mouth 4 (four) times daily.  420 mL  0  . topiramate (TOPAMAX) 200 MG tablet Take 200 mg by mouth daily. For migraines      . traMADol (ULTRAM) 50 MG tablet Take 1 tablet (50 mg total) by mouth every 8 (eight) hours as needed for pain.  90 tablet  0  . zolpidem (AMBIEN) 10 MG tablet Take 10 mg by mouth at bedtime. sleep       No current facility-administered medications on file prior to visit.    Past Surgical History  Procedure Laterality Date  . Tubal ligation    . Foot surgery    . Ankle surgery    . Dilation and curettage of uterus      Allergies  Allergen Reactions  . Shellfish Allergy     Positive allergy test    History   Social History  . Marital Status: Married    Spouse Name: N/A    Number of  Children: N/A  . Years of Education: N/A   Occupational History  . Not on file.   Social History Main Topics  . Smoking status: Never Smoker   . Smokeless tobacco: Not on file  . Alcohol Use: No  . Drug Use: No  . Sexual Activity: Not on file   Other Topics Concern  . Not on file   Social History Narrative  . No narrative on file    Family History  Problem Relation Age of Onset  . Hypertension Mother   . Hypertension Father   . Heart attack Father   . Diabetes Neg Hx   . Hyperlipidemia Neg Hx   . Sudden death Neg Hx     BP 123/84  Pulse 81  Ht 5\' 6"  (1.676 m)  Wt 272 lb (123.378 kg)  BMI 43.92 kg/m2  Review of Systems: See HPI above.    Objective:  Physical Exam:  Gen: NAD  Right foot/ankle: No gross deformity, swelling, ecchymoses FROM TTP proximal plantar fascia. Negative ant drawer and talar tilt.  Thompsons test negative. Negative calcaneal squeeze. NV intact distally.    Assessment & Plan:  1. Right plantar fasciitis - shown home exercise program and stretches to do daily.  Arch binders.  Discussed otc orthotics as well.  Icing, tylenol/nsaids as needed.  Injection a consideration if not improving.  Consider PT also.

## 2014-12-01 ENCOUNTER — Encounter (HOSPITAL_BASED_OUTPATIENT_CLINIC_OR_DEPARTMENT_OTHER): Payer: Self-pay

## 2014-12-01 ENCOUNTER — Emergency Department (HOSPITAL_BASED_OUTPATIENT_CLINIC_OR_DEPARTMENT_OTHER): Payer: Medicaid Other

## 2014-12-01 ENCOUNTER — Emergency Department (HOSPITAL_BASED_OUTPATIENT_CLINIC_OR_DEPARTMENT_OTHER)
Admission: EM | Admit: 2014-12-01 | Discharge: 2014-12-01 | Disposition: A | Discharge status: Home / Self Care | Payer: Medicaid Other | Attending: Emergency Medicine | Admitting: Emergency Medicine

## 2014-12-01 DIAGNOSIS — R109 Unspecified abdominal pain: Secondary | ICD-10-CM | POA: Diagnosis present

## 2014-12-01 DIAGNOSIS — G43909 Migraine, unspecified, not intractable, without status migrainosus: Secondary | ICD-10-CM | POA: Diagnosis not present

## 2014-12-01 DIAGNOSIS — Z9851 Tubal ligation status: Secondary | ICD-10-CM | POA: Insufficient documentation

## 2014-12-01 DIAGNOSIS — M25511 Pain in right shoulder: Secondary | ICD-10-CM | POA: Diagnosis not present

## 2014-12-01 DIAGNOSIS — Z3202 Encounter for pregnancy test, result negative: Secondary | ICD-10-CM | POA: Diagnosis not present

## 2014-12-01 DIAGNOSIS — I10 Essential (primary) hypertension: Secondary | ICD-10-CM | POA: Insufficient documentation

## 2014-12-01 DIAGNOSIS — R1010 Upper abdominal pain, unspecified: Secondary | ICD-10-CM

## 2014-12-01 DIAGNOSIS — Z862 Personal history of diseases of the blood and blood-forming organs and certain disorders involving the immune mechanism: Secondary | ICD-10-CM | POA: Insufficient documentation

## 2014-12-01 DIAGNOSIS — Z9889 Other specified postprocedural states: Secondary | ICD-10-CM | POA: Insufficient documentation

## 2014-12-01 DIAGNOSIS — Z9884 Bariatric surgery status: Secondary | ICD-10-CM | POA: Diagnosis not present

## 2014-12-01 DIAGNOSIS — K219 Gastro-esophageal reflux disease without esophagitis: Secondary | ICD-10-CM | POA: Diagnosis not present

## 2014-12-01 LAB — URINE MICROSCOPIC-ADD ON

## 2014-12-01 LAB — COMPREHENSIVE METABOLIC PANEL
ALBUMIN: 3.3 g/dL — AB (ref 3.5–5.2)
ALT: 6 U/L (ref 0–35)
AST: 13 U/L (ref 0–37)
Alkaline Phosphatase: 95 U/L (ref 39–117)
Anion gap: 5 (ref 5–15)
BUN: 18 mg/dL (ref 6–23)
CO2: 17 mmol/L — AB (ref 19–32)
CREATININE: 0.7 mg/dL (ref 0.50–1.10)
Calcium: 8 mg/dL — ABNORMAL LOW (ref 8.4–10.5)
Chloride: 116 mmol/L — ABNORMAL HIGH (ref 96–112)
GFR calc Af Amer: >90 mL/min (ref >90)
GFR calc non Af Amer: >90 mL/min (ref >90)
Glucose, Bld: 95 mg/dL (ref 70–99)
Potassium: 3.4 mmol/L — ABNORMAL LOW (ref 3.5–5.1)
Sodium: 138 mmol/L (ref 135–145)
TOTAL PROTEIN: 6.7 g/dL (ref 6.0–8.3)
Total Bilirubin: 0.4 mg/dL (ref 0.3–1.2)

## 2014-12-01 LAB — LIPASE, BLOOD: LIPASE: 25 U/L (ref 11–59)

## 2014-12-01 LAB — CBC WITH DIFFERENTIAL/PLATELET
BASOS PCT: 0 % (ref 0–1)
Basophils Absolute: 0 10*3/uL (ref 0.0–0.1)
EOS PCT: 1 % (ref 0–5)
Eosinophils Absolute: 0 10*3/uL (ref 0.0–0.7)
HEMATOCRIT: 24.2 % — AB (ref 36.0–46.0)
HEMOGLOBIN: 7.2 g/dL — AB (ref 12.0–15.0)
Lymphocytes Relative: 56 % — ABNORMAL HIGH (ref 12–46)
Lymphs Abs: 3.1 10*3/uL (ref 0.7–4.0)
MCH: 22.4 pg — AB (ref 26.0–34.0)
MCHC: 29.8 g/dL — ABNORMAL LOW (ref 30.0–36.0)
MCV: 75.2 fL — AB (ref 78.0–100.0)
MONO ABS: 0.4 10*3/uL (ref 0.1–1.0)
MONOS PCT: 8 % (ref 3–12)
NEUTROS ABS: 1.9 10*3/uL (ref 1.7–7.7)
Neutrophils Relative %: 35 % — ABNORMAL LOW (ref 43–77)
PLATELETS: 212 10*3/uL (ref 150–400)
RBC: 3.22 MIL/uL — AB (ref 3.87–5.11)
RDW: 17.7 % — AB (ref 11.5–15.5)
WBC: 5.5 10*3/uL (ref 4.0–10.5)

## 2014-12-01 LAB — URINALYSIS, ROUTINE W REFLEX MICROSCOPIC
Bilirubin Urine: NEGATIVE
GLUCOSE, UA: NEGATIVE mg/dL
Hgb urine dipstick: NEGATIVE
Ketones, ur: 15 mg/dL — AB
LEUKOCYTES UA: NEGATIVE
NITRITE: NEGATIVE
PROTEIN: 30 mg/dL — AB
SPECIFIC GRAVITY, URINE: 1.027 (ref 1.005–1.030)
UROBILINOGEN UA: 1 mg/dL (ref 0.0–1.0)
pH: 5.5 (ref 5.0–8.0)

## 2014-12-01 LAB — TROPONIN I

## 2014-12-01 LAB — PREGNANCY, URINE: PREG TEST UR: NEGATIVE

## 2014-12-01 LAB — OCCULT BLOOD X 1 CARD TO LAB, STOOL: Fecal Occult Bld: NEGATIVE

## 2014-12-01 MED ORDER — SODIUM CHLORIDE 0.9 % IV BOLUS (SEPSIS)
1000.0000 mL | Freq: Once | INTRAVENOUS | Status: AC
  Administered 2014-12-01: 1000 mL via INTRAVENOUS

## 2014-12-01 MED ORDER — HYDROCODONE-ACETAMINOPHEN 5-325 MG PO TABS: 1.0000 | ORAL_TABLET | Freq: Four times a day (QID) | ORAL | Status: DC | PRN

## 2014-12-01 MED ORDER — HYDROCODONE-ACETAMINOPHEN 5-325 MG PO TABS
1.0000 | ORAL_TABLET | Freq: Once | ORAL | Status: AC
  Administered 2014-12-01: 1 via ORAL
  Filled 2014-12-01: qty 1

## 2014-12-01 MED ORDER — IOHEXOL 300 MG/ML  SOLN
100.0000 mL | Freq: Once | INTRAMUSCULAR | Status: AC | PRN
  Administered 2014-12-01: 100 mL via INTRAVENOUS

## 2014-12-01 NOTE — ED Provider Notes (Signed)
CSN: [id number]     Arrival date & time 12/01/14  1327 History   First MD Initiated Contact with Patient 12/01/14 1559     Chief Complaint  Patient presents with  . Abdominal Pain     (Consider location/radiation/quality/duration/timing/severity/associated sxs/prior Treatment) The history is provided by the patient. No language interpreter was used.  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 Patient reports that the discomfort is localized to epigastric and upper quadrant of her abdomen described as an aching pain that is constant worse with eating. Patient reports that when she eats the pain is more intense and constant. States that at times the pain radiates to her back, right scapula. Reported that she's been having intermittent episodes of nausea and chills. Stated that she's been having normal bowel movements, her normal. Stated that 3 and half weeks ago she was being treated for UTI. Stated that they thought they had to hospitalize her secondary to it not improving, reported that it improved with time and IM antibiotics. Patient stated that within the past 6 days she has lost 3 pounds, reported that this is not normal for her. Denied removal of appendix and gallbladder. Denied fever, vomiting, diarrhea, melena, hematochezia, dysuria, hematuria, chest pain, shortness of breath, difficulty breathing, vaginal discharge, vaginal bleeding, vaginal pain, travels, rash, changes in color. PCP Dr. Piedad Climes  Past Medical History  Diagnosis Date  . Migraine   . Hypertension   . GERD (gastroesophageal reflux disease)   . Anemia   . Insomnia    Past Surgical History  Procedure Laterality Date  . Tubal ligation    . Foot surgery    . Ankle surgery    . Dilation and curettage of uterus      Family History  Problem Relation Age of Onset  . Hypertension Mother   . Hypertension Father   . Heart attack Father   . Diabetes Neg Hx   . Hyperlipidemia Neg Hx   . Sudden death Neg Hx    History  Substance Use Topics  . Smoking status: Never Smoker   . Smokeless tobacco: Not on file  . Alcohol Use: No   OB History    No data available     Review of Systems  Constitutional: Negative for fever and chills.  Eyes: Negative for visual disturbance.  Respiratory: Negative for chest tightness and shortness of breath.   Cardiovascular: Negative for chest pain.  Gastrointestinal: Positive for nausea and abdominal pain. Negative for vomiting, diarrhea, constipation, blood in stool and anal bleeding.  Genitourinary: Negative for dysuria, hematuria, decreased urine volume, vaginal bleeding, vaginal discharge, vaginal pain and pelvic pain.  Musculoskeletal: Positive for arthralgias (Right shoulder pain ). Negative for back pain, neck pain and neck stiffness.  Neurological: Negative for dizziness, weakness and headaches.      Allergies  Shellfish allergy  Home Medications   Prior to Admission medications   Medication Sig Start Date End Date Taking? Authorizing Provider  calcium citrate-vitamin D (CITRACAL+D) 315-200 MG-UNIT per tablet Take 1 tablet by mouth 2 (two) times daily.   Yes Historical Provider, MD  MULTIPLE VITAMIN PO Take by mouth.   Yes Historical Provider, MD  omeprazole (PRILOSEC) 20 MG capsule Take 20 mg by mouth daily.   Yes Historical Provider, MD  potassium chloride (KLOR-CON) 20 MEQ packet  Take by mouth 2 (two) times daily.   Yes Historical Provider, MD  Thiamine HCl (VITAMIN B-1) 250 MG tablet Take 250 mg by mouth daily.   Yes Historical Provider, MD  amLODipine (NORVASC) 10 MG tablet  05/04/12   Historical Provider, MD  esomeprazole (NEXIUM) 40 MG capsule Take 40 mg by mouth at bedtime.      Historical Provider, MD  Fe Cbn-Fe Gluc-FA-B12-C-DSS (FERRALET 90)  90-1 MG TABS  04/29/12   Historical Provider, MD  ferrous sulfate 325 (65 FE) MG tablet Take 325 mg by mouth 2 (two) times daily with a meal.     Historical Provider, MD  HYDROcodone-acetaminophen (NORCO/VICODIN) 5-325 MG per tablet Take 1 tablet by mouth every 6 (six) hours as needed for severe pain. 12/01/14   Havier Deeb, PA-C  sucralfate (CARAFATE) 1 GM/10ML suspension Take 10 mLs (1 g total) by mouth 4 (four) times daily. 11/22/12   April Palumbo, MD  topiramate (TOPAMAX) 200 MG tablet Take 200 mg by mouth daily. For migraines    Historical Provider, MD  traMADol (ULTRAM) 50 MG tablet Take 1 tablet (50 mg total) by mouth every 8 (eight) hours as needed for pain. 02/22/13   Lenda Kelp, MD  zolpidem (AMBIEN) 10 MG tablet Take 10 mg by mouth at bedtime. sleep    Historical Provider, MD   BP 110/76 mmHg  Pulse 59  Temp(Src) 98.7 F (37.1 C) (Oral)  Resp 16  Ht 5\' 5"  (1.651 m)  Wt 169 lb (76.658 kg)  BMI 28.12 kg/m2  SpO2 100% Physical Exam  Constitutional: She is oriented to person, place, and time. She appears well-developed and well-nourished. No distress.  HENT:  Head: Normocephalic and atraumatic.  Mouth/Throat: Oropharynx is clear and moist. No oropharyngeal exudate.  Eyes: Conjunctivae and EOM are normal. Pupils are equal, round, and reactive to light. Right eye exhibits no discharge. Left eye exhibits no discharge.  Neck: Normal range of motion. Neck supple. No tracheal deviation present.  Cardiovascular: Normal rate, regular rhythm and normal heart sounds.  Exam reveals no friction rub.   No murmur heard. Pulses:      Radial pulses are 2+ on the right side, and 2+ on the left side.  Pulmonary/Chest: Effort normal and breath sounds normal. No respiratory distress. She has no wheezes. She has no rales.  Abdominal: Soft. Bowel sounds are normal. She exhibits no distension. There is tenderness in the right upper quadrant and epigastric area. There is no rebound, no guarding and  no CVA tenderness.  Genitourinary:  Rectal exam: Negative swelling, erythema, formation, lesions, sores, deformities, abnormalities identified to the anus. Negative hemorrhoids. Negative bright red blood per rectum. Brown stools identified, negative blood on glove. Strong sphincter tone. Exam chaperoned with nurse  Musculoskeletal: Normal range of motion.  Full ROM to upper and lower extremities without difficulty noted, negative ataxia noted.  Lymphadenopathy:    She has no cervical adenopathy.  Neurological: She is alert and oriented to person, place, and time. No cranial nerve deficit. She exhibits normal muscle tone. Coordination normal.  Skin: Skin is warm and dry. No rash noted. She is not diaphoretic. No erythema.  Psychiatric: She has a normal mood and affect. Her behavior is normal. Thought content normal.  Nursing note and vitals reviewed.   ED Course  Procedures (including critical care time)  Results for orders placed or performed during the hospital encounter of 12/01/14  Urinalysis, Routine w reflex microscopic  Result Value Ref Range  Color, Urine AMBER (A) YELLOW   APPearance CLOUDY (A) CLEAR   Specific Gravity, Urine 1.027 1.005 - 1.030   pH 5.5 5.0 - 8.0   Glucose, UA NEGATIVE NEGATIVE mg/dL   Hgb urine dipstick NEGATIVE NEGATIVE   Bilirubin Urine NEGATIVE NEGATIVE   Ketones, ur 15 (A) NEGATIVE mg/dL   Protein, ur 30 (A) NEGATIVE mg/dL   Urobilinogen, UA 1.0 0.0 - 1.0 mg/dL   Nitrite NEGATIVE NEGATIVE   Leukocytes, UA NEGATIVE NEGATIVE  Pregnancy, urine  Result Value Ref Range   Preg Test, Ur NEGATIVE NEGATIVE  Urine microscopic-add on  Result Value Ref Range   Squamous Epithelial / LPF FEW (A) RARE   WBC, UA 3-6 <3 WBC/hpf   RBC / HPF 0-2 <3 RBC/hpf   Bacteria, UA MANY (A) RARE   Casts GRANULAR CAST (A) NEGATIVE   Crystals CA OXALATE CRYSTALS (A) NEGATIVE   Urine-Other MUCOUS PRESENT   CBC with Differential/Platelet  Result Value Ref Range   WBC 5.5  4.0 - 10.5 K/uL   RBC 3.22 (L) 3.87 - 5.11 MIL/uL   Hemoglobin 7.2 (L) 12.0 - 15.0 g/dL   HCT 11.924.2 (L) 14.736.0 - 82.946.0 %   MCV 75.2 (L) 78.0 - 100.0 fL   MCH 22.4 (L) 26.0 - 34.0 pg   MCHC 29.8 (L) 30.0 - 36.0 g/dL   RDW 56.217.7 (H) 13.011.5 - 86.515.5 %   Platelets 212 150 - 400 K/uL   Neutrophils Relative % 35 (L) 43 - 77 %   Neutro Abs 1.9 1.7 - 7.7 K/uL   Lymphocytes Relative 56 (H) 12 - 46 %   Lymphs Abs 3.1 0.7 - 4.0 K/uL   Monocytes Relative 8 3 - 12 %   Monocytes Absolute 0.4 0.1 - 1.0 K/uL   Eosinophils Relative 1 0 - 5 %   Eosinophils Absolute 0.0 0.0 - 0.7 K/uL   Basophils Relative 0 0 - 1 %   Basophils Absolute 0.0 0.0 - 0.1 K/uL  Comprehensive metabolic panel  Result Value Ref Range   Sodium 138 135 - 145 mmol/L   Potassium 3.4 (L) 3.5 - 5.1 mmol/L   Chloride 116 (H) 96 - 112 mmol/L   CO2 17 (L) 19 - 32 mmol/L   Glucose, Bld 95 70 - 99 mg/dL   BUN 18 6 - 23 mg/dL   Creatinine, Ser 7.840.70 0.50 - 1.10 mg/dL   Calcium 8.0 (L) 8.4 - 10.5 mg/dL   Total Protein 6.7 6.0 - 8.3 g/dL   Albumin 3.3 (L) 3.5 - 5.2 g/dL   AST 13 0 - 37 U/L   ALT 6 0 - 35 U/L   Alkaline Phosphatase 95 39 - 117 U/L   Total Bilirubin 0.4 0.3 - 1.2 mg/dL   GFR calc non Af Amer >90 >90 mL/min   GFR calc Af Amer >90 >90 mL/min   Anion gap 5 5 - 15  Lipase, blood  Result Value Ref Range   Lipase 25 11 - 59 U/L  Troponin I  Result Value Ref Range   Troponin I <0.03 <0.031 ng/mL  Occult blood card to lab, stool Provider will collect  Result Value Ref Range   Fecal Occult Bld NEGATIVE NEGATIVE    Labs Review Labs Reviewed  URINALYSIS, ROUTINE W REFLEX MICROSCOPIC - Abnormal; Notable for the following:    Color, Urine AMBER (*)    APPearance CLOUDY (*)    Ketones, ur 15 (*)    Protein, ur 30 (*)  All other components within normal limits  URINE MICROSCOPIC-ADD ON - Abnormal; Notable for the following:    Squamous Epithelial / LPF FEW (*)    Bacteria, UA MANY (*)    Casts GRANULAR CAST (*)     Crystals CA OXALATE CRYSTALS (*)    All other components within normal limits  CBC WITH DIFFERENTIAL/PLATELET - Abnormal; Notable for the following:    RBC 3.22 (*)    Hemoglobin 7.2 (*)    HCT 24.2 (*)    MCV 75.2 (*)    MCH 22.4 (*)    MCHC 29.8 (*)    RDW 17.7 (*)    Neutrophils Relative % 35 (*)    Lymphocytes Relative 56 (*)    All other components within normal limits  COMPREHENSIVE METABOLIC PANEL - Abnormal; Notable for the following:    Potassium 3.4 (*)    Chloride 116 (*)    CO2 17 (*)    Calcium 8.0 (*)    Albumin 3.3 (*)    All other components within normal limits  PREGNANCY, URINE  LIPASE, BLOOD  TROPONIN I  OCCULT BLOOD X 1 CARD TO LAB, STOOL    Imaging Review US Abdomen Complete  12/01/2014   CLINICAL DATA:  Epigastric pain  EXAM: ULTRASOUND ABDOMEN COMPLETE  COMPARISON:  CT abdomen pelvis 04/22/2014  FINDINGS: Gallbladder: No gallstones or wall thickening visualized. No sonographic Murphy sign noted.  Common bile duct: Diameter: 2.6 mm  Liver: No focal lesion identified. Within normal limits in parenchymal echogenicity.  IVC: No abnormality visualized.  Pancreas: Visualized portion unremarkable.  Spleen: Size and appearance within normal limits.  Right Kidney: Length: 12.2 cm. Echogenicity within normal limits. No mass or hydronephrosis visualized.  Left Kidney: Length: 13.0 cm. Echogenicity within normal limits. No mass or hydronephrosis visualized.  Abdominal aorta: No aneurysm visualized.  Other findings: None.  IMPRESSION: No significant abnormality.   Electronically Signed   By: Marlan Palau M.D.   On: 12/01/2014 17:39   Ct Abdomen Pelvis W Contrast  12/01/2014   CLINICAL DATA:  Abdominal pain  EXAM: CT ABDOMEN AND PELVIS WITH CONTRAST  TECHNIQUE: Multidetector CT imaging of the abdomen and pelvis was performed using the standard protocol following bolus administration of intravenous contrast.  CONTRAST:  OMNIPAQUE IOHEXOL 300 MG/ML  SOLN  COMPARISON:   04/22/2014  FINDINGS: The lung bases are clear.  The liver demonstrates no focal abnormality. There is no intrahepatic or extrahepatic biliary ductal dilatation. The gallbladder is normal. The spleen demonstrates no focal abnormality. The kidneys, adrenal glands and pancreas are normal. The bladder is unremarkable.  There is evidence of prior gastric bypass. The stomach, duodenum, small intestine, and large intestine demonstrate no bowel dilatation. There is a normal caliber appendix in the right lower quadrant without periappendiceal inflammatory changes. There is a small right paracentral ventral hernia which is less conspicuous compared with 04/22/2014 with mild adjacent scarring. There is no pneumoperitoneum, pneumatosis, or portal venous gas. There is no abdominal or pelvic free fluid. There is no lymphadenopathy. There is an enhancing 2.5 cm posterior uterine mass most consistent with a uterine fibroid. There is mild generalized anasarca.  The abdominal aorta is normal in caliber.  There are no lytic or sclerotic osseous lesions.  IMPRESSION: 1. No acute abdominal or pelvic pathology. 2. There is a small right paracentral ventral hernia which is less conspicuous compared with 04/22/2014 with mild adjacent scarring.   Electronically Signed   By: Elige Ko  On: 12/01/2014 19:01     EKG Interpretation   Date/Time:  Sunday December 01 2014 16:16:12 EDT Ventricular Rate:  60 PR Interval:  182 QRS Duration: 80 QT Interval:  418 QTC Calculation: 418 R Axis:   34 Text Interpretation:  Normal sinus rhythm Normal ECG No significant change  since last tracing Confirmed by Anitra Lauth  MD, Alphonzo Lemmings ([id number]) on 12/01/2014  5:53:25 PM     7:01 PM Patient reported that her hemoglobin is normally between 7 and 8, stated that she's been dealing with anemia since her pregnancies. States that she is taking iron supplementation.  MDM   Final diagnoses:  Pain of upper abdomen  Gastroesophageal reflux disease,  esophagitis presence not specified  H/O gastric bypass    Medications  sodium chloride 0.9 % bolus 1,000 mL (0 mLs Intravenous Stopped 12/01/14 1855)  iohexol (OMNIPAQUE) 300 MG/ML solution 100 mL (100 mLs Intravenous Contrast Given 12/01/14 1844)  HYDROcodone-acetaminophen (NORCO/VICODIN) 5-325 MG per tablet 1 tablet (1 tablet Oral Given 12/01/14 2054)    Filed Vitals:   12/01/14 1355 12/01/14 1607 12/01/14 1756 12/01/14 1856  BP: 115/74 107/71 105/75 110/76  Pulse: 82 62 58 59  Temp: 99.2 F (37.3 C) 98.7 F (37.1 C)    TempSrc: Oral Oral    Resp: Height:  (1.651 m)     Weight: 169 lb (76.658 kg)     SpO2: 100% 100% 100% 100%   This provider reviewed the patient's chart in care everywhere. Patient was recently seen at [facility] where an upper EGD was performed on 10/25/2014 identifying 2 marginal ulcers. Patient was recently followed as an outpatient with GI at [facility] on 11/01/2014 regarding epigastric, right upper quadrant pain worse with eating-associated with ulcers second area, patient with gastric bypass. Patient has another endoscopy scheduled within approximately 3-4 weeks. EKG noted normal sinus rhythm a heart rate of 60 bpm-negative can change since last tracing. Troponin negative elevation. CBC negative elevated leukocytosis. Hemoglobin 7.2, hematocrit 24.2. When compared to 2 years ago patient's hemoglobin was 11.6, hemoglobin today is 7.2. Fecal occult negative. CMP noted elevated chloride of 116 l- mildly low bicarb of 17 mild hypokalemia 3.4. Glucose is 95, anion gap is 5.0 mg/L. Lipase negative elevation. Urinalysis noted many bacteria with oxalate crystals-negative nitrites, hemoglobin, leukocytes. Urine pregnancy is negative. Abdominal ultrasound no significant abnormalities -no gallstones identified or wall thickening, no sonographic Murphy sign. CT abdomen pelvis with contrast noted to abdominal or pelvic pathology identified-small right paracentral  ventral hernia which is less conspicuous compared to September 2015. CT abdomen pelvis with contrast negative for acute abdominal processes. Ultrasound negative for acute gallbladder infection. Urine negative findings of pyelonephritis or UTI-UTI is resolving. Discussed case in great detail with attending physician, Dr. Anitra Lauth, reviewed labs and imaging in great detail - regarding chloride and bicarbonate, reported that this is mainly due to her gastric sleeve-recommended patient to be seen by her surgeon and agreed with plan of discharge.  Negative episodes of emesis in ED setting. Suspicion to be possible GERD, as well as ulcers that have been noted on EGD was performed on 10/25/2014, patient currently followed at GI in [facility]. Patient stable, afebrile. Patient not septic appearing. Negative signs of respiratory distress. Patient tolerated fluids by mouth without difficulty and pain medications without difficulty. Discharged patient. Referred patient to PCP and GI/surgeon. Discussed with patient to rest and stay hydrated. Discussed with patient proper diet. Discussed with patient to closely monitor symptoms  and if symptoms are to worsen or change to report back to the ED - strict return instructions given.  Patient agreed to plan of care, understood, all questions answered.   Raymon Mutton, PA-C 12/01/14 2059  Gwyneth Sprout, MD 12/04/14 [phone]

## 2014-12-01 NOTE — Discharge Instructions (Signed)
Please call your doctor for a followup appointment within 24-48 hours. When you talk to your doctor please let them know that you were seen in the emergency department and have them acquire all of your records so that they can discuss the findings with you and formulate a treatment plan to fully care for your new and ongoing problems. Please follow-up with her primary care provider Please follow-up gastrin to neurology Please follow-up with your surgeon Please rest and stay hydrated Please avoid foods that are high in fat, grease, oil or city for this can lead to worsening symptoms Please drink plenty of water Please take medications as prescribed - while on pain medications there is to be no drinking alcohol, driving, operating any heavy machinery. If extra please dispose in a proper manner. Please do not take any extra Tylenol with this medication for this can lead to Tylenol overdose and liver issues.  Please continue to monitor symptoms closely and if symptoms are to worsen or change (fever greater than 101, chills, sweating, nausea, vomiting, chest pain, shortness of breathe, difficulty breathing, weakness, numbness, tingling, worsening or changes to pain pattern, abdominal distention, inability to keep food or fluids down, diarrhea, blood in the stools, black tarry stools) please report back to the Emergency Department immediately.    Abdominal Pain Many things can cause abdominal pain. Usually, abdominal pain is not caused by a disease and will improve without treatment. It can often be observed and treated at home. Your health care provider will do a physical exam and possibly order blood tests and X-rays to help determine the seriousness of your pain. However, in many cases, more time must pass before a clear cause of the pain can be found. Before that point, your health care provider may not know if you need more testing or further treatment. HOME CARE INSTRUCTIONS  Monitor your abdominal  pain for any changes. The following actions may help to alleviate any discomfort you are experiencing:  Only take over-the-counter or prescription medicines as directed by your health care provider.  Do not take laxatives unless directed to do so by your health care provider.  Try a clear liquid diet (broth, tea, or water) as directed by your health care provider. Slowly move to a bland diet as tolerated. SEEK MEDICAL CARE IF:  You have unexplained abdominal pain.  You have abdominal pain associated with nausea or diarrhea.  You have pain when you urinate or have a bowel movement.  You experience abdominal pain that wakes you in the night.  You have abdominal pain that is worsened or improved by eating food.  You have abdominal pain that is worsened with eating fatty foods.  You have a fever. SEEK IMMEDIATE MEDICAL CARE IF:   Your pain does not go away within 2 hours.  You keep throwing up (vomiting).  Your pain is felt only in portions of the abdomen, such as the right side or the left lower portion of the abdomen.  You pass bloody or black tarry stools. MAKE SURE YOU:  Understand these instructions.   Will watch your condition.   Will get help right away if you are not doing well or get worse.  Document Released: 05/05/2005 Document Revised: 07/31/2013 Document Reviewed: 04/04/2013 [facility] Patient Information 2015 [place]. This information is not intended to replace advice given to you by your health care provider. Make sure you discuss any questions you have with your health care provider.  Gastroesophageal Reflux Disease, Adult Gastroesophageal reflux  disease (GERD) happens when acid from your stomach goes into your food pipe (esophagus). The acid can cause a burning feeling in your chest. Over time, the acid can make small holes (ulcers) in your food pipe.  HOME CARE  Ask your doctor for advice about:  Losing weight.  Quitting smoking.  Alcohol  use.  Avoid foods and drinks that make your problems worse. You may want to avoid:  Caffeine and alcohol.  Chocolate.  Mints.  Garlic and onions.  Spicy foods.  Citrus fruits, such as oranges, lemons, or limes.  Foods that contain tomato, such as sauce, chili, salsa, and pizza.  Fried and fatty foods.  Avoid lying down for 3 hours before you go to bed or before you take a nap.  Eat small meals often, instead of large meals.  Wear loose-fitting clothing. Do not wear anything tight around your waist.  Raise (elevate) the head of your bed 6 to 8 inches with wood blocks. Using extra pillows does not help.  Only take medicines as told by your doctor.  Do not take aspirin or ibuprofen. GET HELP RIGHT AWAY IF:   You have pain in your arms, neck, jaw, teeth, or back.  Your pain gets worse or changes.  You feel sick to your stomach (nauseous), throw up (vomit), or sweat (diaphoresis).  You feel short of breath, or you pass out (faint).  Your throw up is green, yellow, black, or looks like coffee grounds or blood.  Your poop (stool) is red, bloody, or black. MAKE SURE YOU:   Understand these instructions.  Will watch your condition.  Will get help right away if you are not doing well or get worse. Document Released: 01/12/2008 Document Revised: 10/18/2011 Document Reviewed: 02/12/2011 [facility] Patient Information 2015 [place], [place]. This information is not intended to replace advice given to you by your health care provider. Make sure you discuss any questions you have with your health care provider.  Food Choices for Gastroesophageal Reflux Disease When you have gastroesophageal reflux disease (GERD), the foods you eat and your eating habits are very important. Choosing the right foods can help ease your discomfort.  WHAT GUIDELINES DO I NEED TO FOLLOW?   Choose fruits, vegetables, whole grains, and low-fat dairy products.   Choose low-fat meat, fish, and  poultry.  Limit fats such as oils, salad dressings, butter, nuts, and avocado.   Keep a food diary. This helps you identify foods that cause symptoms.   Avoid foods that cause symptoms. These may be different for everyone.   Eat small meals often instead of 3 large meals a day.   Eat your meals slowly, in a place where you are relaxed.   Limit fried foods.   Cook foods using methods other than frying.   Avoid drinking alcohol.   Avoid drinking large amounts of liquids with your meals.   Avoid bending over or lying down until 2-3 hours after eating.  WHAT FOODS ARE NOT RECOMMENDED?  These are some foods and drinks that may make your symptoms worse: Vegetables Tomatoes. Tomato juice. Tomato and spaghetti sauce. Chili peppers. Onion and garlic. Horseradish. Fruits Oranges, grapefruit, and lemon (fruit and juice). Meats High-fat meats, fish, and poultry. This includes hot dogs, ribs, ham, sausage, salami, and bacon. Dairy Whole milk and chocolate milk. Sour cream. Cream. Butter. Ice cream. Cream cheese.  Drinks Coffee and tea. Bubbly (carbonated) drinks or energy drinks. Condiments Hot sauce. Barbecue sauce.  Sweets/Desserts Chocolate and cocoa. Donuts. Peppermint and  spearmint. Fats and Oils High-fat foods. This includes [place] fries and potato chips. Other Vinegar. Strong spices. This includes black pepper, white pepper, red pepper, cayenne, curry powder, cloves, ginger, and chili powder. The items listed above may not be a complete list of foods and drinks to avoid. Contact your dietitian for more information. Document Released: 01/25/2012 Document Revised: 07/31/2013 Document Reviewed: 05/30/2013 [facility] Patient Information 2015 [place], [place]. This information is not intended to replace advice given to you by your health care provider. Make sure you discuss any questions you have with your health care provider.

## 2014-12-01 NOTE — ED Notes (Signed)
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 Reports that she feels full and able to take in very little solids or liquids. 3lb weight loss in 6 days

## 2016-01-02 ENCOUNTER — Emergency Department (HOSPITAL_BASED_OUTPATIENT_CLINIC_OR_DEPARTMENT_OTHER)
Admission: EM | Admit: 2016-01-02 | Discharge: 2016-01-02 | Disposition: A | Discharge status: Home / Self Care | Payer: Medicaid Other | Attending: Emergency Medicine | Admitting: Emergency Medicine

## 2016-01-02 ENCOUNTER — Encounter (HOSPITAL_BASED_OUTPATIENT_CLINIC_OR_DEPARTMENT_OTHER): Payer: Self-pay | Admitting: Emergency Medicine

## 2016-01-02 DIAGNOSIS — I10 Essential (primary) hypertension: Secondary | ICD-10-CM | POA: Diagnosis not present

## 2016-01-02 DIAGNOSIS — R51 Headache: Secondary | ICD-10-CM | POA: Insufficient documentation

## 2016-01-02 DIAGNOSIS — Z79899 Other long term (current) drug therapy: Secondary | ICD-10-CM | POA: Diagnosis not present

## 2016-01-02 DIAGNOSIS — R519 Headache, unspecified: Secondary | ICD-10-CM

## 2016-01-02 MED ORDER — PROCHLORPERAZINE EDISYLATE 5 MG/ML IJ SOLN
10.0000 mg | Freq: Once | INTRAMUSCULAR | Status: AC
  Administered 2016-01-02: 10 mg via INTRAVENOUS
  Filled 2016-01-02: qty 2

## 2016-01-02 MED ORDER — METOCLOPRAMIDE HCL 10 MG PO TABS: 10.0000 mg | ORAL_TABLET | Freq: Two times a day (BID) | ORAL | Status: DC | PRN

## 2016-01-02 MED ORDER — DIPHENHYDRAMINE HCL 50 MG/ML IJ SOLN
50.0000 mg | Freq: Once | INTRAMUSCULAR | Status: AC
  Administered 2016-01-02: 50 mg via INTRAVENOUS
  Filled 2016-01-02: qty 1

## 2016-01-02 MED ORDER — METHYLPREDNISOLONE SODIUM SUCC 125 MG IJ SOLR
125.0000 mg | Freq: Once | INTRAMUSCULAR | Status: AC
  Administered 2016-01-02: 125 mg via INTRAVENOUS
  Filled 2016-01-02: qty 2

## 2016-01-02 NOTE — ED Provider Notes (Signed)
CSN: [id number]     Arrival date & time 01/02/16  8295 History   First MD Initiated Contact with Patient 01/02/16 [phone]     Chief Complaint  Patient presents with  . Migraine     (Consider location/radiation/quality/duration/timing/severity/associated sxs/prior Treatment) HPI   Anna [name] is a 45 y.o. female with PMH of migraines, here with her typical headache.  She states it began 2 days ago.  She took her normal tylenol and topamax without any relief.  She tried that combination again today but her headache persisted and she could not sleep. She states this headache is her typical pain and is not any worse. She denies any neuro symptoms.  She has not come to the hospital in a long time for headache and can normally manage them at home.  She states toradol does not work for her.  She denies fevers or recent illness. There are no further complaints.    10 Systems reviewed and are negative for acute change except as noted in the HPI.    Past Medical History  Diagnosis Date  . Migraine   . Hypertension   . GERD (gastroesophageal reflux disease)   . Anemia   . Insomnia    Past Surgical History  Procedure Laterality Date  . Tubal ligation    . Foot surgery    . Ankle surgery    . Dilation and curettage of uterus     Family History  Problem Relation Age of Onset  . Hypertension Mother   . Hypertension Father   . Heart attack Father   . Diabetes Neg Hx   . Hyperlipidemia Neg Hx   . Sudden death Neg Hx    Social History  Substance Use Topics  . Smoking status: Never Smoker   . Smokeless tobacco: None  . Alcohol Use: No   OB History    No data available     Review of Systems    Allergies  Nsaids and Shellfish allergy  Home Medications   Prior to Admission medications   Medication Sig Start Date End Date Taking? Authorizing Provider  amLODipine (NORVASC) 10 MG tablet  05/04/12   Historical Provider, MD  calcium citrate-vitamin D (CITRACAL+D) 315-200  MG-UNIT per tablet Take 1 tablet by mouth 2 (two) times daily.    Historical Provider, MD  esomeprazole (NEXIUM) 40 MG capsule Take 40 mg by mouth at bedtime.      Historical Provider, MD  Fe Cbn-Fe Gluc-FA-B12-C-DSS (FERRALET 90) 90-1 MG TABS  04/29/12   Historical Provider, MD  ferrous sulfate 325 (65 FE) MG tablet Take 325 mg by mouth 2 (two) times daily with a meal.     Historical Provider, MD  HYDROcodone-acetaminophen (NORCO/VICODIN) 5-325 MG per tablet Take 1 tablet by mouth every 6 (six) hours as needed for severe pain. 12/01/14   Marissa Sciacca, PA-C  MULTIPLE VITAMIN PO Take by mouth.    Historical Provider, MD  omeprazole (PRILOSEC) 20 MG capsule Take 20 mg by mouth daily.    Historical Provider, MD  potassium chloride (KLOR-CON) 20 MEQ packet Take by mouth 2 (two) times daily.    Historical Provider, MD  sucralfate (CARAFATE) 1 GM/10ML suspension Take 10 mLs (1 g total) by mouth 4 (four) times daily. 11/22/12   April Palumbo, MD  Thiamine HCl (VITAMIN B-1) 250 MG tablet Take 250 mg by mouth daily.    Historical Provider, MD  topiramate (TOPAMAX) 200 MG tablet Take 200 mg by mouth daily. For migraines  Historical Provider, MD  traMADol (ULTRAM) 50 MG tablet Take 1 tablet (50 mg total) by mouth every 8 (eight) hours as needed for pain. 02/22/13   Lenda KelpShane R Hudnall, MD  zolpidem (AMBIEN) 10 MG tablet Take 10 mg by mouth at bedtime. sleep    Historical Provider, MD   BP 142/90 mmHg  Pulse 74  Temp(Src) 97.6 F (36.4 C) (Oral)  Resp 16  Ht 5' 5.5" (1.664 m)  Wt 154 lb (69.854 kg)  BMI 25.23 kg/m2  SpO2 100% Physical Exam  Constitutional: She is oriented to person, place, and time. She appears well-developed and well-nourished. No distress.  HENT:  Head: Normocephalic and atraumatic.  Nose: Nose normal.  Mouth/Throat: Oropharynx is clear and moist. No oropharyngeal exudate.  Eyes: Conjunctivae and EOM are normal. Pupils are equal, round, and reactive to light. No scleral icterus.   Neck: Normal range of motion. Neck supple. No JVD present. No tracheal deviation present. No thyromegaly present.  Cardiovascular: Normal rate, regular rhythm and normal heart sounds.  Exam reveals no gallop and no friction rub.   No murmur heard. Pulmonary/Chest: Effort normal and breath sounds normal. No respiratory distress. She has no wheezes. She exhibits no tenderness.  Abdominal: Soft. Bowel sounds are normal. She exhibits no distension and no mass. There is no tenderness. There is no rebound and no guarding.  Musculoskeletal: Normal range of motion. She exhibits no edema or tenderness.  Lymphadenopathy:    She has no cervical adenopathy.  Neurological: She is alert and oriented to person, place, and time. No cranial nerve deficit. She exhibits normal muscle tone.  Normal strength and sensation in all extremities.  Normal cerebellar testing.  Skin: Skin is warm and dry. No rash noted. No erythema. No pallor.  Nursing note and vitals reviewed.   ED Course  Procedures (including critical care time) Labs Review Labs Reviewed - No data to display  Imaging Review No results found. I have personally reviewed and evaluated these images and lab results as part of my medical decision-making.   EKG Interpretation None      MDM   Final diagnoses:  Nonintractable episodic headache, unspecified headache type    Patient presents to the ED for recurrent headache.  She was given benadryl, compazine, and solumedrol for pain control.  Will observe in the ED.   6:29 AM Upon repeat evaluation, patient found sleeping comfortably in the room. I woke her up and she stated that headache has resolved.  Will DC home with 8tabs of reglan to take as needed if her normal medications do not work.  Advised to see her PCP within 3 days for close follow up.  She appears well and in NAD. VS remain within her normal limits and she is safe for DC.   Tomasita CrumbleAdeleke Jalayah Gutridge, MD 01/02/16 0630

## 2016-01-02 NOTE — Discharge Instructions (Signed)
Recurrent Migraine Headache Ms. Mason JimSingleton, continue your home medications for headache and see a primary care doctor within 3 days for close follow up. You can also try reglan for your headache.   If symptoms worsen, come back to the ED immediately. Thank you. A migraine headache is very bad, throbbing pain on one or both sides of your head. Recurrent migraines keep coming back. Talk to your doctor about what things may bring on (trigger) your migraine headaches. HOME CARE  Only take medicines as told by your doctor.  Lie down in a dark, quiet room when you have a migraine.  Keep a journal to find out if certain things bring on migraine headaches. For example, write down:  What you eat and drink.  How much sleep you get.  Any change to your diet or medicines.  Lessen how much alcohol you drink.  Quit smoking if you smoke.  Get enough sleep.  Lessen any stress in your life.  Keep lights dim if bright lights bother you or make your migraines worse. GET HELP IF:  Medicine does not help your migraines.  Your pain keeps coming back.  You have a fever. GET HELP RIGHT AWAY IF:   Your migraine becomes really bad.  You have a stiff neck.  You have trouble seeing.  Your muscles are weak, or you lose muscle control.  You lose your balance or have trouble walking.  You feel like you will pass out (faint), or you pass out.  You have really bad symptoms that are different than your first symptoms. MAKE SURE YOU:   Understand these instructions.  Will watch your condition.  Will get help right away if you are not doing well or get worse.   This information is not intended to replace advice given to you by your health care provider. Make sure you discuss any questions you have with your health care provider.   Document Released: 05/04/2008 Document Revised: 07/31/2013 Document Reviewed: 04/02/2013 Elsevier Interactive Patient Education [employer]2016 [employer] Inc.

## 2016-01-02 NOTE — ED Notes (Signed)
Pt c/o HA with nausea. Pt states symptoms typical for her previous migraines.

## 2017-01-06 ENCOUNTER — Encounter (HOSPITAL_BASED_OUTPATIENT_CLINIC_OR_DEPARTMENT_OTHER): Payer: Self-pay | Admitting: *Deleted

## 2017-01-06 ENCOUNTER — Emergency Department (HOSPITAL_BASED_OUTPATIENT_CLINIC_OR_DEPARTMENT_OTHER)
Admission: EM | Admit: 2017-01-06 | Discharge: 2017-01-07 | Disposition: A | Discharge status: Home / Self Care | Payer: BLUE CROSS/BLUE SHIELD | Attending: Emergency Medicine | Admitting: Emergency Medicine

## 2017-01-06 DIAGNOSIS — Z79899 Other long term (current) drug therapy: Secondary | ICD-10-CM | POA: Insufficient documentation

## 2017-01-06 DIAGNOSIS — I10 Essential (primary) hypertension: Secondary | ICD-10-CM | POA: Diagnosis not present

## 2017-01-06 DIAGNOSIS — R1013 Epigastric pain: Secondary | ICD-10-CM | POA: Diagnosis not present

## 2017-01-06 DIAGNOSIS — R110 Nausea: Secondary | ICD-10-CM | POA: Diagnosis not present

## 2017-01-06 LAB — COMPREHENSIVE METABOLIC PANEL
ALK PHOS: 110 U/L (ref 38–126)
ALT: 7 U/L — ABNORMAL LOW (ref 14–54)
AST: 13 U/L — ABNORMAL LOW (ref 15–41)
Albumin: 3.3 g/dL — ABNORMAL LOW (ref 3.5–5.0)
Anion gap: 5 (ref 5–15)
BILIRUBIN TOTAL: 0.5 mg/dL (ref 0.3–1.2)
BUN: 16 mg/dL (ref 6–20)
CO2: 21 mmol/L — AB (ref 22–32)
Calcium: 8.4 mg/dL — ABNORMAL LOW (ref 8.9–10.3)
Chloride: 111 mmol/L (ref 101–111)
Creatinine, Ser: 0.69 mg/dL (ref 0.44–1.00)
GFR calc Af Amer: >60 mL/min (ref >60)
GFR calc non Af Amer: >60 mL/min (ref >60)
GLUCOSE: 100 mg/dL — AB (ref 65–99)
Potassium: 3.5 mmol/L (ref 3.5–5.1)
Sodium: 137 mmol/L (ref 135–145)
TOTAL PROTEIN: 6.5 g/dL (ref 6.5–8.1)

## 2017-01-06 LAB — CBC
HCT: 31.8 % — ABNORMAL LOW (ref 36.0–46.0)
Hemoglobin: 10.5 g/dL — ABNORMAL LOW (ref 12.0–15.0)
MCH: 29.9 pg (ref 26.0–34.0)
MCHC: 33 g/dL (ref 30.0–36.0)
MCV: 90.6 fL (ref 78.0–100.0)
Platelets: 242 10*3/uL (ref 150–400)
RBC: 3.51 MIL/uL — ABNORMAL LOW (ref 3.87–5.11)
RDW: 12.8 % (ref 11.5–15.5)
WBC: 4.6 10*3/uL (ref 4.0–10.5)

## 2017-01-06 LAB — LIPASE, BLOOD: Lipase: 22 U/L (ref 11–51)

## 2017-01-06 NOTE — ED Notes (Signed)
C/o abd pain onset at 1600  This pm, w nausea denies v/d, no urinary sx

## 2017-01-06 NOTE — ED Triage Notes (Signed)
Abdominal pain that comes and goes. Radiation into her back. Had an episode 2 weeks ago. Then this evening the pain came back. Hx of gastric bypass.

## 2017-01-07 DIAGNOSIS — R1013 Epigastric pain: Secondary | ICD-10-CM | POA: Diagnosis not present

## 2017-01-07 LAB — URINALYSIS, ROUTINE W REFLEX MICROSCOPIC
Bilirubin Urine: NEGATIVE
GLUCOSE, UA: NEGATIVE mg/dL
Hgb urine dipstick: NEGATIVE
KETONES UR: NEGATIVE mg/dL
Nitrite: NEGATIVE
PROTEIN: NEGATIVE mg/dL
Specific Gravity, Urine: 1.024 (ref 1.005–1.030)
pH: 6.5 (ref 5.0–8.0)

## 2017-01-07 LAB — URINALYSIS, MICROSCOPIC (REFLEX): RBC / HPF: NONE SEEN RBC/hpf (ref 0–5)

## 2017-01-07 LAB — PREGNANCY, URINE: Preg Test, Ur: NEGATIVE

## 2017-01-07 MED ORDER — HYDROCODONE-ACETAMINOPHEN 5-325 MG PO TABS: 1.0000 | ORAL_TABLET | Freq: Four times a day (QID) | ORAL | 0 refills | Status: DC | PRN

## 2017-01-07 MED ORDER — HYDROCODONE-ACETAMINOPHEN 5-325 MG PO TABS
1.0000 | ORAL_TABLET | Freq: Once | ORAL | Status: AC
  Administered 2017-01-07: 1 via ORAL
  Filled 2017-01-07: qty 1

## 2017-01-07 NOTE — ED Provider Notes (Signed)
[facility]   CSN: [id number] Arrival date & time: 01/06/17  2228     History   Chief Complaint Chief Complaint  Patient presents with  . Abdominal Pain    HPI Anna [name] is a 46 y.o. female.  The history is provided by the patient.  Abdominal Pain   This is a new problem. The current episode started 6 to 12 hours ago. The problem occurs constantly. The problem has been gradually improving. The pain is associated with an unknown factor. The pain is located in the epigastric region. The pain is severe. Associated symptoms include nausea. Pertinent negatives include fever, diarrhea, hematochezia, melena, vomiting, constipation and dysuria. The symptoms are aggravated by certain positions and palpation. Nothing relieves the symptoms. Past medical history comments: h/o gastric bypass.  pt reports episode of severe epigastric abdominal pain earlier It is now improved It is not related to eating She reports similar episode over 2 weeks ago No fever/vomiting No CP/SOB No other acute complaints   Past Medical History:  Diagnosis Date  . Anemia   . GERD (gastroesophageal reflux disease)   . Hypertension   . Insomnia   . Migraine     Patient Active Problem List   Diagnosis Date Noted  . Plantar fasciitis of right foot 07/31/2013  . Right ankle pain 01/12/2013  . Left knee pain 05/08/2012    Past Surgical History:  Procedure Laterality Date  . ANKLE SURGERY    . DILATION AND CURETTAGE OF UTERUS    . FOOT SURGERY    . GASTRIC BYPASS    . TUBAL LIGATION      OB History    No data available       Home Medications    Prior to Admission medications   Medication Sig Start Date End Date Taking? Authorizing Provider  amLODipine (NORVASC) 10 MG tablet  05/04/12   [provider]  calcium citrate-vitamin D (CITRACAL+D) 315-200 MG-UNIT per tablet Take 1 tablet by mouth 2 (two) times daily.    [provider]    esomeprazole (NEXIUM) 40 MG capsule Take 40 mg by mouth at bedtime.      [provider]  Fe Cbn-Fe Gluc-FA-B12-C-DSS (FERRALET 90) 90-1 MG TABS  04/29/12   [provider]  ferrous sulfate 325 (65 FE) MG tablet Take 325 mg by mouth 2 (two) times daily with a meal.     [provider]  HYDROcodone-acetaminophen (NORCO/VICODIN) 5-325 MG tablet Take 1 tablet by mouth every 6 (six) hours as needed. 01/07/17   Zadie Rhine, MD  metoCLOPramide (REGLAN) 10 MG tablet Take 1 tablet (10 mg total) by mouth 2 (two) times daily as needed (headache). 01/02/16   Tomasita Crumble, MD  MULTIPLE VITAMIN PO Take by mouth.    [provider]  omeprazole (PRILOSEC) 20 MG capsule Take 20 mg by mouth daily.    [provider]  potassium chloride (KLOR-CON) 20 MEQ packet Take by mouth 2 (two) times daily.    [provider]  sucralfate (CARAFATE) 1 GM/10ML suspension Take 10 mLs (1 g total) by mouth 4 (four) times daily. 11/22/12   Palumbo, April, MD  Thiamine HCl (VITAMIN B-1) 250 MG tablet Take 250 mg by mouth daily.    [provider]  topiramate (TOPAMAX) 200 MG tablet Take 200 mg by mouth daily. For migraines    [provider]  traMADol (ULTRAM) 50 MG tablet Take 1 tablet (50 mg total) by mouth  every 8 (eight) hours as needed for pain. 02/22/13   Hudnall, Azucena FallenShane R, MD  zolpidem (AMBIEN) 10 MG tablet Take 10 mg by mouth at bedtime. sleep    [provider]    Family History Family History  Problem Relation Age of Onset  . Hypertension Mother   . Hypertension Father   . Heart attack Father   . Diabetes Neg Hx   . Hyperlipidemia Neg Hx   . Sudden death Neg Hx     Social History Social History  Substance Use Topics  . Smoking status: Never Smoker  . Smokeless tobacco: Never Used  . Alcohol use No     Allergies   Nsaids and Shellfish allergy   Review of Systems Review of Systems  Constitutional: Negative for fever.   Cardiovascular: Negative for chest pain.  Gastrointestinal: Positive for abdominal pain and nausea. Negative for blood in stool, constipation, diarrhea, hematochezia, melena and vomiting.  Genitourinary: Negative for dysuria.  All other systems reviewed and are negative.    Physical Exam Updated Vital Signs BP 110/81 (BP Location: Left Arm)   Pulse 87   Temp 98.3 F (36.8 C) (Oral)   Resp 17   Ht 1.676 m (5\' 6" )   Wt 75.8 kg (167 lb)   LMP 12/23/2016   SpO2 100%   BMI 26.95 kg/m   Physical Exam CONSTITUTIONAL: Well developed/well nourished, sleeping on arrival to room HEAD: Normocephalic/atraumatic EYES: EOMI/PERRL, no scleral icterus ENMT: Mucous membranes moist NECK: supple no meningeal signs SPINE/BACK:entire spine nontender CV: S1/S2 noted, no murmurs/rubs/gallops noted LUNGS: Lungs are clear to auscultation bilaterally, no apparent distress ABDOMEN: soft, mild epigastric tenderness, no hernias noted, no rebound or guarding, bowel sounds noted throughout abdomen GU:no cva tenderness NEURO: Pt is awake/alert/appropriate, moves all extremitiesx4.  No facial droop.   EXTREMITIES: pulses normal/equal, full ROM SKIN: warm, color normal PSYCH: no abnormalities of mood noted, alert and oriented to situation   ED Treatments / Results  Labs (all labs ordered are listed, but only abnormal results are displayed) Labs Reviewed  COMPREHENSIVE METABOLIC PANEL - Abnormal; Notable for the following:       Result Value   CO2 21 (*)    Glucose, Bld 100 (*)    Calcium 8.4 (*)    Albumin 3.3 (*)    AST 13 (*)    ALT 7 (*)    All other components within normal limits  CBC - Abnormal; Notable for the following:    RBC 3.51 (*)    Hemoglobin 10.5 (*)    HCT 31.8 (*)    All other components within normal limits  URINALYSIS, ROUTINE W REFLEX MICROSCOPIC - Abnormal; Notable for the following:    APPearance CLOUDY (*)    Leukocytes, UA TRACE (*)    All other components within  normal limits  URINALYSIS, MICROSCOPIC (REFLEX) - Abnormal; Notable for the following:    Bacteria, UA RARE (*)    Squamous Epithelial / LPF 6-30 (*)    All other components within normal limits  LIPASE, BLOOD  PREGNANCY, URINE    EKG  EKG Interpretation  Date/Time:  Thursday Jan 06 2017 22:39:49 EDT Ventricular Rate:  72 PR Interval:  160 QRS Duration: 74 QT Interval:  398 QTC Calculation: 435 R Axis:   27 Text Interpretation:  Normal sinus rhythm Normal ECG No significant change since last tracing Confirmed by Zadie RhineWickline, Chevelle Coulson ([id number]) on 01/07/2017 12:18:16 AM       Radiology No results found.  Procedures Procedures (including critical care time)  Medications Ordered in ED Medications  HYDROcodone-acetaminophen (NORCO/VICODIN) 5-325 MG per tablet 1 tablet (1 tablet Oral Given 01/07/17 0039)     Initial Impression / Assessment and Plan / ED Course  I have reviewed the triage vital signs and the nursing notes.  Pertinent labs results that were available during my care of the patient were reviewed by me and considered in my medical decision making (see chart for details).     12:39 AM Pt sleeping on arrival to room She is well appearing She is feeling improved Labs reassuring/near baseline Previous h/o gastric bypass We discussed next step, and I offered imaging (CT scan) but pt declines She feels improved and wants to go home She can call PCP later today Will give short course of vicodin in case pain returns Narcotic database reviewed and considered in decision making  Discussed strict ER precautions with patient including fever>100, vomiting or worsened pain  All pertinent questions answered prior to discharge Patient appropriate and safe for discharge at this time    Final Clinical Impressions(s) / ED Diagnoses   Final diagnoses:  Epigastric pain    New Prescriptions New Prescriptions   HYDROCODONE-ACETAMINOPHEN (NORCO/VICODIN) 5-325 MG TABLET     Take 1 tablet by mouth every 6 (six) hours as needed.     Zadie Rhine, MD 01/07/17 [phone]

## 2017-01-07 NOTE — Discharge Instructions (Signed)

## 2017-10-08 ENCOUNTER — Emergency Department (HOSPITAL_BASED_OUTPATIENT_CLINIC_OR_DEPARTMENT_OTHER): Payer: BLUE CROSS/BLUE SHIELD

## 2017-10-08 ENCOUNTER — Other Ambulatory Visit: Payer: Self-pay

## 2017-10-08 ENCOUNTER — Emergency Department (HOSPITAL_BASED_OUTPATIENT_CLINIC_OR_DEPARTMENT_OTHER)
Admission: EM | Admit: 2017-10-08 | Discharge: 2017-10-08 | Disposition: A | Discharge status: Home / Self Care | Payer: BLUE CROSS/BLUE SHIELD | Attending: Emergency Medicine | Admitting: Emergency Medicine

## 2017-10-08 ENCOUNTER — Encounter (HOSPITAL_BASED_OUTPATIENT_CLINIC_OR_DEPARTMENT_OTHER): Payer: Self-pay | Admitting: Emergency Medicine

## 2017-10-08 DIAGNOSIS — K5909 Other constipation: Secondary | ICD-10-CM | POA: Insufficient documentation

## 2017-10-08 DIAGNOSIS — Z79899 Other long term (current) drug therapy: Secondary | ICD-10-CM | POA: Insufficient documentation

## 2017-10-08 DIAGNOSIS — R109 Unspecified abdominal pain: Secondary | ICD-10-CM

## 2017-10-08 DIAGNOSIS — I10 Essential (primary) hypertension: Secondary | ICD-10-CM | POA: Insufficient documentation

## 2017-10-08 HISTORY — DX: Unspecified asthma, uncomplicated: J45.909

## 2017-10-08 LAB — URINALYSIS, MICROSCOPIC (REFLEX): RBC / HPF: NONE SEEN RBC/hpf (ref 0–5)

## 2017-10-08 LAB — CBC WITH DIFFERENTIAL/PLATELET
BASOS PCT: 0 %
Basophils Absolute: 0 10*3/uL (ref 0.0–0.1)
Eosinophils Absolute: 0.4 10*3/uL (ref 0.0–0.7)
Eosinophils Relative: 8 %
HCT: 30.3 % — ABNORMAL LOW (ref 36.0–46.0)
HEMOGLOBIN: 10.1 g/dL — AB (ref 12.0–15.0)
LYMPHS PCT: 33 %
Lymphs Abs: 1.8 10*3/uL (ref 0.7–4.0)
MCH: 30.5 pg (ref 26.0–34.0)
MCHC: 33.3 g/dL (ref 30.0–36.0)
MCV: 91.5 fL (ref 78.0–100.0)
Monocytes Absolute: 0.5 10*3/uL (ref 0.1–1.0)
Monocytes Relative: 9 %
NEUTROS PCT: 50 %
Neutro Abs: 2.7 10*3/uL (ref 1.7–7.7)
Platelets: 260 10*3/uL (ref 150–400)
RBC: 3.31 MIL/uL — ABNORMAL LOW (ref 3.87–5.11)
RDW: 16.9 % — ABNORMAL HIGH (ref 11.5–15.5)
WBC: 5.5 10*3/uL (ref 4.0–10.5)

## 2017-10-08 LAB — LIPASE, BLOOD: Lipase: 22 U/L (ref 11–51)

## 2017-10-08 LAB — COMPREHENSIVE METABOLIC PANEL
ALBUMIN: 2.7 g/dL — AB (ref 3.5–5.0)
ALT: 8 U/L — ABNORMAL LOW (ref 14–54)
ANION GAP: 6 (ref 5–15)
AST: 12 U/L — ABNORMAL LOW (ref 15–41)
Alkaline Phosphatase: 77 U/L (ref 38–126)
BILIRUBIN TOTAL: 0.2 mg/dL — AB (ref 0.3–1.2)
BUN: 14 mg/dL (ref 6–20)
CO2: 17 mmol/L — ABNORMAL LOW (ref 22–32)
Calcium: 8.1 mg/dL — ABNORMAL LOW (ref 8.9–10.3)
Chloride: 114 mmol/L — ABNORMAL HIGH (ref 101–111)
Creatinine, Ser: 0.6 mg/dL (ref 0.44–1.00)
GFR calc Af Amer: >60 mL/min (ref >60)
GFR calc non Af Amer: >60 mL/min (ref >60)
Glucose, Bld: 97 mg/dL (ref 65–99)
Potassium: 3.7 mmol/L (ref 3.5–5.1)
Sodium: 137 mmol/L (ref 135–145)
Total Protein: 6.3 g/dL — ABNORMAL LOW (ref 6.5–8.1)

## 2017-10-08 LAB — URINALYSIS, ROUTINE W REFLEX MICROSCOPIC
Bilirubin Urine: NEGATIVE
Glucose, UA: NEGATIVE mg/dL
Hgb urine dipstick: NEGATIVE
Ketones, ur: NEGATIVE mg/dL
Nitrite: NEGATIVE
PH: 6.5 (ref 5.0–8.0)
Protein, ur: NEGATIVE mg/dL
Specific Gravity, Urine: <1.005 — ABNORMAL LOW (ref 1.005–1.030)

## 2017-10-08 LAB — PREGNANCY, URINE: Preg Test, Ur: NEGATIVE

## 2017-10-08 MED ORDER — ONDANSETRON HCL 4 MG/2ML IJ SOLN
4.0000 mg | Freq: Once | INTRAMUSCULAR | Status: AC
  Administered 2017-10-08: 4 mg via INTRAVENOUS
  Filled 2017-10-08: qty 2

## 2017-10-08 MED ORDER — MORPHINE SULFATE (PF) 4 MG/ML IV SOLN
4.0000 mg | Freq: Once | INTRAVENOUS | Status: DC
  Filled 2017-10-08: qty 1

## 2017-10-08 MED ORDER — IOPAMIDOL (ISOVUE-300) INJECTION 61%
100.0000 mL | Freq: Once | INTRAVENOUS | Status: AC | PRN
  Administered 2017-10-08: 100 mL via INTRAVENOUS

## 2017-10-08 MED ORDER — DICYCLOMINE HCL 10 MG/ML IM SOLN
20.0000 mg | Freq: Once | INTRAMUSCULAR | Status: AC
  Administered 2017-10-08: 20 mg via INTRAMUSCULAR
  Filled 2017-10-08: qty 2

## 2017-10-08 MED ORDER — MORPHINE SULFATE (PF) 4 MG/ML IV SOLN
4.0000 mg | Freq: Once | INTRAVENOUS | Status: AC
  Administered 2017-10-08: 4 mg via INTRAVENOUS
  Filled 2017-10-08: qty 1

## 2017-10-08 MED ORDER — PROMETHAZINE HCL 25 MG PO TABS: 25.0000 mg | ORAL_TABLET | Freq: Four times a day (QID) | ORAL | 0 refills | Status: DC | PRN

## 2017-10-08 MED ORDER — SODIUM CHLORIDE 0.9 % IV BOLUS (SEPSIS)
1000.0000 mL | Freq: Once | INTRAVENOUS | Status: AC
  Administered 2017-10-08: 1000 mL via INTRAVENOUS

## 2017-10-08 MED ORDER — SODIUM CHLORIDE 0.9 % IV BOLUS (SEPSIS): 1000.0000 mL | Freq: Once | INTRAVENOUS | Status: DC

## 2017-10-08 NOTE — ED Triage Notes (Signed)
Pt is s/p tummy tuck 2/15. Pt c/o RUQ abd pain since yesterday. Pt reports normal BM yesterday. Pt has abd binder and 1 drain in place.

## 2017-10-08 NOTE — Discharge Instructions (Signed)
I recommend that you increase your water and fiber intake. If you are not able to eat foods high in fiber, you may use Benefiber or Metamucil over-the-counter. I also recommend you use MiraLAX 1-2 times a day and Colace 100 mg twice a day to help with bowel movements. These medications are over the counter.  You may use other over-the-counter medications such as Dulcolax, Fleet enemas, magnesium citrate as needed for constipation. Please note that some of these medications may cause you to have abdominal cramping which is normal. If you develop severe abdominal pain, fever, vomiting, distention of your abdomen, unable to have a bowel movement for 5 days or are not passing gas, please return to the hospital. ° °

## 2017-10-08 NOTE — ED Notes (Signed)
Pt is unable to give urine sample at this time. 

## 2017-10-08 NOTE — ED Notes (Signed)
No adverse reaction to IM injection 

## 2017-10-08 NOTE — ED Provider Notes (Signed)
TIME SEEN: 4:31 AM  CHIEF COMPLAINT: Abdominal pain  HPI: Patient is a 47 year old female with history of hypertension, migraines who presents to the emergency department with right-sided abdominal pain that started yesterday.  Has had nausea and decreased appetite.  No vomiting or diarrhea.  No dysuria or hematuria.  No vaginal bleeding or discharge.  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 Shon Hough with [facility] plastic surgery on February 15.  Has a JP drain in place and states that she has had good output with no increase in that the serosanguineous drainage has actually become lighter in nature.  No purulent drainage.  She denies any fever.  Has had previous D&C, right ectopic pregnancy and a BTL.  ROS: See HPI Constitutional: no fever  Eyes: no drainage  ENT: no runny nose   Cardiovascular:  no chest pain  Resp: no SOB  GI: no vomiting GU: no dysuria Integumentary: no rash  Allergy: no hives  Musculoskeletal: no leg swelling  Neurological: no slurred speech ROS otherwise negative  PAST MEDICAL HISTORY/PAST SURGICAL HISTORY:  Past Medical History:  Diagnosis Date  . Anemia   . GERD (gastroesophageal reflux disease)   . Hypertension   . Insomnia   . Migraine     MEDICATIONS:  Prior to Admission medications   Medication Sig Start Date End Date Taking? Authorizing Provider  amLODipine (NORVASC) 10 MG tablet  05/04/12   [provider]  calcium citrate-vitamin D (CITRACAL+D) 315-200 MG-UNIT per tablet Take 1 tablet by mouth 2 (two) times daily.    [provider]  esomeprazole (NEXIUM) 40 MG capsule Take 40 mg by mouth at bedtime.      [provider]  Fe Cbn-Fe Gluc-FA-B12-C-DSS (FERRALET 90) 90-1 MG TABS  04/29/12   [provider]  ferrous sulfate 325 (65 FE) MG tablet Take 325 mg by mouth 2 (two) times daily with a meal.     [provider]  HYDROcodone-acetaminophen (NORCO/VICODIN) 5-325 MG tablet Take 1 tablet by mouth every 6 (six) hours as needed. 01/07/17   Zadie Rhine, MD  metoCLOPramide (REGLAN) 10 MG tablet Take 1 tablet (10 mg total) by mouth 2 (two) times daily as needed (headache). 01/02/16   Tomasita Crumble, MD  MULTIPLE VITAMIN PO Take by mouth.    [provider]  omeprazole (PRILOSEC) 20 MG capsule Take 20 mg by mouth daily.    [provider]  potassium chloride (KLOR-CON) 20 MEQ packet Take by mouth 2 (two) times daily.    [provider]  sucralfate (CARAFATE) 1 GM/10ML suspension Take 10 mLs (1 g total) by mouth 4 (four) times daily. 11/22/12   Palumbo, April, MD  Thiamine HCl (VITAMIN B-1) 250 MG tablet Take 250 mg by mouth daily.    [provider]  topiramate (TOPAMAX) 200 MG tablet Take 200 mg by mouth daily. For migraines    [provider]  traMADol (ULTRAM) 50 MG tablet Take 1 tablet (50 mg total) by mouth every 8 (eight) hours as needed for pain. 02/22/13   Hudnall, Azucena Fallen, MD  zolpidem (AMBIEN) 10 MG tablet Take 10 mg by mouth at bedtime. sleep    [provider]    ALLERGIES:  Allergies  Allergen Reactions  . Nsaids Other (See Comments)    Unable to take due to gastric bypass  . Shellfish Allergy     Positive allergy test  SOCIAL HISTORY:  Social History   Tobacco Use  . Smoking status: Never Smoker  . Smokeless tobacco: Never Used  Substance Use Topics  . Alcohol use: No    FAMILY HISTORY: Family History  Problem Relation Age of Onset  . Hypertension Mother   . Hypertension Father   . Heart attack Father   . Diabetes Neg Hx   . Hyperlipidemia Neg Hx   . Sudden death Neg Hx     EXAM: BP 110/63 (BP Location: Right Arm)   Pulse 84   Temp 98.4 F (36.9 C) (Oral)   Resp 18   Ht 5\' 6"  (1.676 m)   Wt 77.1 kg (170 lb)   SpO2 100%   BMI 27.44 kg/m  CONSTITUTIONAL: Alert and oriented and responds appropriately  to questions. Well-appearing; well-nourished HEAD: Normocephalic EYES: Conjunctivae clear, pupils appear equal, EOMI ENT: normal nose; moist mucous membranes NECK: Supple, no meningismus, no nuchal rigidity, no LAD  CARD: RRR; S1 and S2 appreciated; no murmurs, no clicks, no rubs, no gallops RESP: Normal chest excursion without splinting or tachypnea; breath sounds clear and equal bilaterally; no wheezes, no rhonchi, no rales, no hypoxia or respiratory distress, speaking full sentences ABD/GI: Normal bowel sounds; non-distended; soft, although tender throughout the lower abdomen, tender in the right mid abdomen, no guarding or rebound, large transverse lower surgical scar that is healing well without surrounding redness or warmth or any purulent drainage or active bleeding.  She also has laparoscopic scars around the umbilicus that are also clean, dry and intact without bleeding or drainage.  JP drain in place in the lower abdomen with approximately 15 mL's of serosanguineous drainage noted in the bulb. BACK:  The back appears normal and is non-tender to palpation, there is no CVA tenderness EXT: Normal ROM in all joints; non-tender to palpation; no edema; normal capillary refill; no cyanosis, no calf tenderness or swelling    SKIN: Normal color for age and race; warm; no rash NEURO: Moves all extremities equally PSYCH: The patient's mood and manner are appropriate. Grooming and personal hygiene are appropriate.  MEDICAL DECISION MAKING: Patient here with abdominal pain.  She has had a Roux-en-Y gastric bypass in 2015 and recently underwent abdominoplasty.  States she is a decreased appetite and nausea.  Differential includes cholecystitis, cholelithiasis, pancreatitis, colitis, bowel obstruction, appendicitis.  Will obtain labs, urine, CT of abdomen pelvis.  Will give IV fluids, pain and nausea medicine.  ED PROGRESS: Patient's labs show no leukocytosis.  Patient has a hyperchloremic metabolic  acidosis which she has had previously.  She denies any diarrhea.  No known history of renal disease.  LFTs and lipase are normal.  Creatinine normal.   CT scan shows no acute abnormality.  There are postoperative changes noted but no abscess, hernia.  No bowel obstruction.  She appears to be constipated.  Appendix visualized and is normal.  Gallbladder appears normal.  Patient now states that she is I am is out of her pain medication.  Per the [facility] controlled substance reporting system patient received thirty 10 mg Vicodin tablets on 09/23/17 and then again another 2010 mg Vicodin tablets on 10/06/17.  Discussed with patient that she should follow-up with her plastic surgeon if she continues to have pain.  Have recommended she start a bowel regimen for constipation.  Will refill her Phenergan.  I feel she is safe to be discharged home.   At this time, I do not feel there is any life-threatening condition present.  I have reviewed and discussed all results (EKG, imaging, lab, urine as appropriate) and exam findings with patient/family. I have reviewed nursing notes and appropriate previous records.  I feel the patient is safe to be discharged home without further emergent workup and can continue workup as an outpatient as needed. Discussed usual and customary return precautions. Patient/family verbalize understanding and are comfortable with this plan.  Outpatient follow-up has been provided if needed. All questions have been answered.     Sybil Shrader, Layla MawKristen N, DO 10/08/17 [phone]

## 2017-11-01 ENCOUNTER — Other Ambulatory Visit: Payer: Self-pay

## 2017-11-01 ENCOUNTER — Emergency Department (HOSPITAL_BASED_OUTPATIENT_CLINIC_OR_DEPARTMENT_OTHER): Payer: BLUE CROSS/BLUE SHIELD

## 2017-11-01 ENCOUNTER — Emergency Department (HOSPITAL_BASED_OUTPATIENT_CLINIC_OR_DEPARTMENT_OTHER)
Admission: EM | Admit: 2017-11-01 | Discharge: 2017-11-02 | Disposition: A | Discharge status: Home / Self Care | Payer: BLUE CROSS/BLUE SHIELD | Attending: Emergency Medicine | Admitting: Emergency Medicine

## 2017-11-01 ENCOUNTER — Encounter (HOSPITAL_BASED_OUTPATIENT_CLINIC_OR_DEPARTMENT_OTHER): Payer: Self-pay | Admitting: *Deleted

## 2017-11-01 DIAGNOSIS — R1084 Generalized abdominal pain: Secondary | ICD-10-CM | POA: Diagnosis present

## 2017-11-01 DIAGNOSIS — J45909 Unspecified asthma, uncomplicated: Secondary | ICD-10-CM | POA: Insufficient documentation

## 2017-11-01 DIAGNOSIS — K5900 Constipation, unspecified: Secondary | ICD-10-CM | POA: Diagnosis not present

## 2017-11-01 DIAGNOSIS — I10 Essential (primary) hypertension: Secondary | ICD-10-CM | POA: Insufficient documentation

## 2017-11-01 DIAGNOSIS — Z79899 Other long term (current) drug therapy: Secondary | ICD-10-CM | POA: Diagnosis not present

## 2017-11-01 HISTORY — DX: Other chronic pain: G89.29

## 2017-11-01 HISTORY — DX: Unspecified abdominal pain: R10.9

## 2017-11-01 LAB — CBC
HEMATOCRIT: 31.9 % — AB (ref 36.0–46.0)
Hemoglobin: 10.7 g/dL — ABNORMAL LOW (ref 12.0–15.0)
MCH: 29.6 pg (ref 26.0–34.0)
MCHC: 33.5 g/dL (ref 30.0–36.0)
MCV: 88.4 fL (ref 78.0–100.0)
Platelets: 275 10*3/uL (ref 150–400)
RBC: 3.61 MIL/uL — AB (ref 3.87–5.11)
RDW: 15.4 % (ref 11.5–15.5)
WBC: 5.4 10*3/uL (ref 4.0–10.5)

## 2017-11-01 LAB — URINALYSIS, ROUTINE W REFLEX MICROSCOPIC
Bilirubin Urine: NEGATIVE
GLUCOSE, UA: NEGATIVE mg/dL
Hgb urine dipstick: NEGATIVE
KETONES UR: NEGATIVE mg/dL
LEUKOCYTES UA: NEGATIVE
Nitrite: NEGATIVE
PH: 7.5 (ref 5.0–8.0)
PROTEIN: NEGATIVE mg/dL
Specific Gravity, Urine: 1.015 (ref 1.005–1.030)

## 2017-11-01 LAB — COMPREHENSIVE METABOLIC PANEL
ALT: 5 U/L — ABNORMAL LOW (ref 14–54)
ANION GAP: 7 (ref 5–15)
AST: 13 U/L — AB (ref 15–41)
Albumin: 3.2 g/dL — ABNORMAL LOW (ref 3.5–5.0)
Alkaline Phosphatase: 83 U/L (ref 38–126)
BILIRUBIN TOTAL: 0.2 mg/dL — AB (ref 0.3–1.2)
BUN: 19 mg/dL (ref 6–20)
CHLORIDE: 112 mmol/L — AB (ref 101–111)
CO2: 19 mmol/L — ABNORMAL LOW (ref 22–32)
Calcium: 8.6 mg/dL — ABNORMAL LOW (ref 8.9–10.3)
Creatinine, Ser: 0.6 mg/dL (ref 0.44–1.00)
Glucose, Bld: 92 mg/dL (ref 65–99)
POTASSIUM: 3.8 mmol/L (ref 3.5–5.1)
Sodium: 138 mmol/L (ref 135–145)
TOTAL PROTEIN: 6.7 g/dL (ref 6.5–8.1)

## 2017-11-01 LAB — LIPASE, BLOOD: LIPASE: 42 U/L (ref 11–51)

## 2017-11-01 LAB — PREGNANCY, URINE: Preg Test, Ur: NEGATIVE

## 2017-11-01 NOTE — ED Triage Notes (Addendum)
Right upper quadrant abdominal x 3 days. States she had a tummy tuck 2/15. States she ran out of Hydrocodone yesterday.

## 2017-11-02 ENCOUNTER — Encounter (HOSPITAL_BASED_OUTPATIENT_CLINIC_OR_DEPARTMENT_OTHER): Payer: Self-pay | Admitting: Emergency Medicine

## 2017-11-02 MED ORDER — DOCUSATE SODIUM 100 MG PO CAPS
100.0000 mg | ORAL_CAPSULE | Freq: Once | ORAL | Status: AC
  Administered 2017-11-02: 100 mg via ORAL
  Filled 2017-11-02: qty 1

## 2017-11-02 MED ORDER — DICYCLOMINE HCL 10 MG/ML IM SOLN
20.0000 mg | Freq: Once | INTRAMUSCULAR | Status: AC
  Administered 2017-11-02: 20 mg via INTRAMUSCULAR
  Filled 2017-11-02: qty 2

## 2017-11-02 MED ORDER — DICYCLOMINE HCL 20 MG PO TABS: 20.0000 mg | ORAL_TABLET | Freq: Two times a day (BID) | ORAL | 0 refills | Status: DC

## 2017-11-02 MED ORDER — POLYETHYLENE GLYCOL 3350 17 GM/SCOOP PO POWD: 17.0000 g | Freq: Every day | ORAL | 0 refills | Status: AC

## 2017-11-02 NOTE — ED Notes (Signed)
Pt returned from xray

## 2017-11-02 NOTE — ED Notes (Addendum)
Removed pt's IV per her request, pt would like to have a copy of her labs, and would like to see the EDP, but would still like to leave.  Dr. Nicanor AlconPalumbo and charge nurse informed.  Asked pt if she still wants the xray that the doctor ordered, and pt states that she wants to see her provider.

## 2017-11-02 NOTE — ED Notes (Signed)
Patient transported to X-ray 

## 2017-11-02 NOTE — ED Notes (Signed)
Pt c/o chronic abdominal pain that is worse with movement, states that it makes her legs hurt and has been going on for three days.  Incidentally, pt had wings about two hours ago despite the abdominal pain.  Pt was advised that wings are probably not the best thing for her abdominal pain, pt became argumentative, stating that her pain has been going on for 3 days implying that the wings did not make it worse.  Nurse vocalized understanding, but still advised that she may want to stick to a bland diet when she is having abdominal pain.  Informed pt that she has an xray ordered, pt replied, "get on with it, my kids are tired and I need to get home."

## 2017-11-11 ENCOUNTER — Encounter (HOSPITAL_BASED_OUTPATIENT_CLINIC_OR_DEPARTMENT_OTHER): Payer: Self-pay | Admitting: Emergency Medicine

## 2017-11-11 NOTE — ED Provider Notes (Signed)
[facility] EMERGENCY DEPARTMENT Provider Note   CSN: [id number] Arrival date & time: 11/01/17  2152     History   Chief Complaint Chief Complaint  Patient presents with  . Abdominal Pain    HPI Anna [name] is a 47 y.o. female.   Abdominal Pain   This is a new problem. The current episode started more than 2 days ago. The problem occurs constantly. The problem has not changed since onset.The pain is associated with an unknown factor. The pain is located in the generalized abdominal region. The pain is moderate. Associated symptoms include constipation. Pertinent negatives include anorexia, fever, hematochezia, melena, nausea and dysuria. Nothing aggravates the symptoms. Nothing relieves the symptoms. Past workup includes surgery. Her past medical history does not include PUD.    Past Medical History:  Diagnosis Date  . Anemia   . Asthma   . Chronic abdominal pain   . GERD (gastroesophageal reflux disease)   . Hypertension   . Insomnia   . Migraine     Patient Active Problem List   Diagnosis Date Noted  . Plantar fasciitis of right foot 07/31/2013  . Right ankle pain 01/12/2013  . Left knee pain 05/08/2012    Past Surgical History:  Procedure Laterality Date  . ANKLE SURGERY    . DILATION AND CURETTAGE OF UTERUS    . FOOT SURGERY    . GASTRIC BYPASS    . TUBAL LIGATION       OB History   None      Home Medications    Prior to Admission medications   Medication Sig Start Date End Date Taking? Authorizing Provider  amLODipine (NORVASC) 10 MG tablet  05/04/12   [provider]  calcium citrate-vitamin D (CITRACAL+D) 315-200 MG-UNIT per tablet Take 1 tablet by mouth 2 (two) times daily.    [provider]  dicyclomine (BENTYL) 20 MG tablet Take 1 tablet (20 mg total) by mouth 2 (two) times daily. 11/02/17   Ellowyn Rieves, MD  esomeprazole (NEXIUM) 40 MG capsule Take 40 mg by mouth at bedtime.      [provider]   Fe Cbn-Fe Gluc-FA-B12-C-DSS (FERRALET 90) 90-1 MG TABS  04/29/12   [provider]  ferrous sulfate 325 (65 FE) MG tablet Take 325 mg by mouth 2 (two) times daily with a meal.     [provider]  HYDROcodone-acetaminophen (NORCO) 10-325 MG tablet Take 1 tablet by mouth every 4 (four) hours as needed.    [provider]  metoCLOPramide (REGLAN) 10 MG tablet Take 1 tablet (10 mg total) by mouth 2 (two) times daily as needed (headache). 01/02/16   Tomasita Crumble, MD  MULTIPLE VITAMIN PO Take by mouth.    [provider]  omeprazole (PRILOSEC) 20 MG capsule Take 20 mg by mouth daily.    [provider]  polyethylene glycol powder (MIRALAX) powder Take 17 g by mouth daily. 11/02/17   Robie Mcniel, MD  potassium chloride (KLOR-CON) 20 MEQ packet Take by mouth 2 (two) times daily.    [provider]  promethazine (PHENERGAN) 25 MG tablet Take 1 tablet (25 mg total) by mouth every 6 (six) hours as needed for nausea or vomiting. 10/08/17   Ward, Layla Maw, DO  sucralfate (CARAFATE) 1 GM/10ML suspension Take 10 mLs (1 g total) by mouth 4 (four) times daily. 11/22/12   Danikah Budzik, MD  Thiamine HCl (VITAMIN B-1) 250 MG tablet Take 250 mg by mouth daily.  [provider]  topiramate (TOPAMAX) 200 MG tablet Take 200 mg by mouth daily. For migraines    [provider]  traMADol (ULTRAM) 50 MG tablet Take 1 tablet (50 mg total) by mouth every 8 (eight) hours as needed for pain. 02/22/13   Hudnall, Azucena FallenShane R, MD  zolpidem (AMBIEN) 10 MG tablet Take 10 mg by mouth at bedtime. sleep    [provider]    Family History Family History  Problem Relation Age of Onset  . Hypertension Mother   . Hypertension Father   . Heart attack Father   . Diabetes Neg Hx   . Hyperlipidemia Neg Hx   . Sudden death Neg Hx     Social History Social History   Tobacco Use  . Smoking status: Never Smoker  . Smokeless tobacco: Never Used    Substance Use Topics  . Alcohol use: No  . Drug use: No     Allergies   Nsaids and Shellfish allergy   Review of Systems Review of Systems  Constitutional: Negative for fever.  Respiratory: Negative for chest tightness and shortness of breath.   Cardiovascular: Negative for chest pain, palpitations and leg swelling.  Gastrointestinal: Positive for abdominal pain and constipation. Negative for abdominal distention, anal bleeding, anorexia, blood in stool, hematochezia, melena, nausea and rectal pain.  Genitourinary: Negative for dysuria.  Neurological: Negative for speech difficulty.  All other systems reviewed and are negative.    Physical Exam Updated Vital Signs BP 106/64 (BP Location: Right Arm)   Pulse 64   Temp 97.9 F (36.6 C) (Oral)   Resp 16   Ht 5\' 6"  (1.676 m)   Wt 74.8 kg (165 lb)   LMP 10/11/2017   SpO2 100%   BMI 26.63 kg/m   Physical Exam  Constitutional: She is oriented to person, place, and time. She appears well-developed and well-nourished. No distress.  HENT:  Head: Normocephalic and atraumatic.  Eyes: Pupils are equal, round, and reactive to light. Conjunctivae are normal.  Neck: Normal range of motion. Neck supple.  Cardiovascular: Normal rate, regular rhythm, normal heart sounds and intact distal pulses.  Pulmonary/Chest: Effort normal and breath sounds normal. No stridor. She has no wheezes. She has no rales.  Abdominal: Soft. Bowel sounds are normal. She exhibits no mass. There is no tenderness. There is no rebound and no guarding.  Musculoskeletal: Normal range of motion.  Neurological: She is alert and oriented to person, place, and time.  Skin: Skin is warm and dry. Capillary refill takes less than 2 seconds.  Psychiatric: She has a normal mood and affect.     ED Treatments / Results  Labs (all labs ordered are listed, but only abnormal results are displayed)  Results for orders placed or performed during the hospital encounter of  11/01/17  Lipase, blood  Result Value Ref Range   Lipase 42 11 - 51 U/L  Comprehensive metabolic panel  Result Value Ref Range   Sodium 138 135 - 145 mmol/L   Potassium 3.8 3.5 - 5.1 mmol/L   Chloride 112 (H) 101 - 111 mmol/L   CO2 19 (L) 22 - 32 mmol/L   Glucose, Bld 92 65 - 99 mg/dL   BUN 19 6 - 20 mg/dL   Creatinine, Ser 4.090.60 0.44 - 1.00 mg/dL   Calcium 8.6 (L) 8.9 - 10.3 mg/dL   Total Protein 6.7 6.5 - 8.1 g/dL   Albumin 3.2 (L) 3.5 - 5.0 g/dL   AST 13 (L) 15 -  41 U/L   ALT 5 (L) 14 - 54 U/L   Alkaline Phosphatase 83 38 - 126 U/L   Total Bilirubin 0.2 (L) 0.3 - 1.2 mg/dL   GFR calc non Af Amer >60 >60 mL/min   GFR calc Af Amer >60 >60 mL/min   Anion gap 7 5 - 15  CBC  Result Value Ref Range   WBC 5.4 4.0 - 10.5 K/uL   RBC 3.61 (L) 3.87 - 5.11 MIL/uL   Hemoglobin 10.7 (L) 12.0 - 15.0 g/dL   HCT 16.1 (L) 09.6 - 04.5 %   MCV 88.4 78.0 - 100.0 fL   MCH 29.6 26.0 - 34.0 pg   MCHC 33.5 30.0 - 36.0 g/dL   RDW 40.9 81.1 - 91.4 %   Platelets 275 150 - 400 K/uL  Urinalysis, Routine w reflex microscopic  Result Value Ref Range   Color, Urine YELLOW YELLOW   APPearance CLOUDY (A) CLEAR   Specific Gravity, Urine 1.015 1.005 - 1.030   pH 7.5 5.0 - 8.0   Glucose, UA NEGATIVE NEGATIVE mg/dL   Hgb urine dipstick NEGATIVE NEGATIVE   Bilirubin Urine NEGATIVE NEGATIVE   Ketones, ur NEGATIVE NEGATIVE mg/dL   Protein, ur NEGATIVE NEGATIVE mg/dL   Nitrite NEGATIVE NEGATIVE   Leukocytes, UA NEGATIVE NEGATIVE  Pregnancy, urine  Result Value Ref Range   Preg Test, Ur NEGATIVE NEGATIVE   Dg Abdomen Acute W/chest  Result Date: 11/02/2017 CLINICAL DATA:  47 year old female with abdominal pain. EXAM: DG ABDOMEN ACUTE W/ 1V CHEST COMPARISON:  CT of the abdomen pelvis dated 10/08/2017 FINDINGS: The lungs are clear. There is no pleural effusion or pneumothorax. The cardiac silhouette is within normal limits. Anastomotic suture in the epigastric area noted. There is moderate stool throughout  the colon. No bowel dilatation or evidence of obstruction. No free air or radiopaque calculi. The osseous structures and soft tissues appear unremarkable. IMPRESSION: 1. No acute cardiopulmonary process. 2. Moderate colonic stool burden.  No evidence of bowel obstruction. Electronically Signed   By: Elgie Collard M.D.   On: 11/02/2017 01:14    EKG None  Radiology No results found.  Procedures Procedures (including critical care time)  Medications Ordered in ED Medications  dicyclomine (BENTYL) injection 20 mg (20 mg Intramuscular Given 11/02/17 0049)  docusate sodium (COLACE) capsule 100 mg (100 mg Oral Given 11/02/17 0140)       Final Clinical Impressions(s) / ED Diagnoses    Final diagnoses:  Constipation, unspecified constipation type   Return for weakness, numbness, changes in vision or speech, fevers >100.4 unrelieved by medication, shortness of breath, intractable vomiting, or diarrhea, abdominal pain, Inability to tolerate liquids or food, cough, altered mental status or any concerns. No signs of systemic illness or infection. The patient is nontoxic-appearing on exam and vital signs are within normal limits.   I have reviewed the triage vital signs and the nursing notes. Pertinent labs &imaging results that were available during my care of the patient were reviewed by me and considered in my medical decision making (see chart for details).  After history, exam, and medical workup I feel the patient has been appropriately medically screened and is safe for discharge home. Pertinent diagnoses were discussed with the patient. Patient was given return precautions.   ED Discharge Orders        Ordered    dicyclomine (BENTYL) 20 MG tablet  2 times daily     11/02/17 0125    polyethylene glycol powder (MIRALAX) powder  Daily     11/02/17 0125       Ewelina Naves, MD 11/11/17 2355

## 2018-04-19 ENCOUNTER — Encounter (HOSPITAL_COMMUNITY): Payer: Self-pay | Admitting: Emergency Medicine

## 2018-04-19 ENCOUNTER — Inpatient Hospital Stay (HOSPITAL_COMMUNITY)
Admission: EM | Admit: 2018-04-19 | Discharge: 2018-04-21 | DRG: 337 | Disposition: A | Discharge status: Home / Self Care | Payer: BLUE CROSS/BLUE SHIELD | Attending: Surgery | Admitting: Surgery

## 2018-04-19 DIAGNOSIS — Z9884 Bariatric surgery status: Secondary | ICD-10-CM

## 2018-04-19 DIAGNOSIS — I10 Essential (primary) hypertension: Secondary | ICD-10-CM | POA: Diagnosis present

## 2018-04-19 DIAGNOSIS — K450 Other specified abdominal hernia with obstruction, without gangrene: Secondary | ICD-10-CM

## 2018-04-19 DIAGNOSIS — J45909 Unspecified asthma, uncomplicated: Secondary | ICD-10-CM | POA: Diagnosis present

## 2018-04-19 DIAGNOSIS — Z79899 Other long term (current) drug therapy: Secondary | ICD-10-CM

## 2018-04-19 DIAGNOSIS — R109 Unspecified abdominal pain: Secondary | ICD-10-CM | POA: Diagnosis present

## 2018-04-19 DIAGNOSIS — K56609 Unspecified intestinal obstruction, unspecified as to partial versus complete obstruction: Secondary | ICD-10-CM

## 2018-04-19 DIAGNOSIS — Z888 Allergy status to other drugs, medicaments and biological substances status: Secondary | ICD-10-CM

## 2018-04-19 DIAGNOSIS — K5909 Other constipation: Secondary | ICD-10-CM | POA: Diagnosis present

## 2018-04-19 DIAGNOSIS — G43909 Migraine, unspecified, not intractable, without status migrainosus: Secondary | ICD-10-CM | POA: Diagnosis present

## 2018-04-19 DIAGNOSIS — K458 Other specified abdominal hernia without obstruction or gangrene: Secondary | ICD-10-CM | POA: Diagnosis not present

## 2018-04-19 DIAGNOSIS — G4700 Insomnia, unspecified: Secondary | ICD-10-CM | POA: Diagnosis present

## 2018-04-19 DIAGNOSIS — K219 Gastro-esophageal reflux disease without esophagitis: Secondary | ICD-10-CM | POA: Diagnosis present

## 2018-04-19 DIAGNOSIS — K660 Peritoneal adhesions (postprocedural) (postinfection): Secondary | ICD-10-CM | POA: Diagnosis present

## 2018-04-19 LAB — COMPREHENSIVE METABOLIC PANEL
ALBUMIN: 3.4 g/dL — AB (ref 3.5–5.0)
ALK PHOS: 110 U/L (ref 38–126)
ALT: 6 U/L (ref 0–44)
ANION GAP: 6 (ref 5–15)
AST: 14 U/L — ABNORMAL LOW (ref 15–41)
BUN: 16 mg/dL (ref 6–20)
CALCIUM: 8.5 mg/dL — AB (ref 8.9–10.3)
CHLORIDE: 114 mmol/L — AB (ref 98–111)
CO2: 20 mmol/L — AB (ref 22–32)
Creatinine, Ser: 0.8 mg/dL (ref 0.44–1.00)
GFR calc Af Amer: >60 mL/min (ref >60)
GFR calc non Af Amer: >60 mL/min (ref >60)
Glucose, Bld: 106 mg/dL — ABNORMAL HIGH (ref 70–99)
Potassium: 3.9 mmol/L (ref 3.5–5.1)
SODIUM: 140 mmol/L (ref 135–145)
Total Bilirubin: 0.4 mg/dL (ref 0.3–1.2)
Total Protein: 7.1 g/dL (ref 6.5–8.1)

## 2018-04-19 LAB — URINALYSIS, ROUTINE W REFLEX MICROSCOPIC
Bilirubin Urine: NEGATIVE
Glucose, UA: NEGATIVE mg/dL
KETONES UR: NEGATIVE mg/dL
Leukocytes, UA: NEGATIVE
Nitrite: NEGATIVE
PROTEIN: NEGATIVE mg/dL
Specific Gravity, Urine: 1.016 (ref 1.005–1.030)
pH: 6 (ref 5.0–8.0)

## 2018-04-19 LAB — CBC
HCT: 29.8 % — ABNORMAL LOW (ref 36.0–46.0)
HEMOGLOBIN: 8.7 g/dL — AB (ref 12.0–15.0)
MCH: 25 pg — AB (ref 26.0–34.0)
MCHC: 29.2 g/dL — AB (ref 30.0–36.0)
MCV: 85.6 fL (ref 78.0–100.0)
Platelets: 349 10*3/uL (ref 150–400)
RBC: 3.48 MIL/uL — ABNORMAL LOW (ref 3.87–5.11)
RDW: 15.9 % — ABNORMAL HIGH (ref 11.5–15.5)
WBC: 5 10*3/uL (ref 4.0–10.5)

## 2018-04-19 LAB — I-STAT BETA HCG BLOOD, ED (MC, WL, AP ONLY)

## 2018-04-19 LAB — POC OCCULT BLOOD, ED: FECAL OCCULT BLD: POSITIVE — AB

## 2018-04-19 LAB — LIPASE, BLOOD: Lipase: 38 U/L (ref 11–51)

## 2018-04-19 MED ORDER — GI COCKTAIL ~~LOC~~
30.0000 mL | Freq: Once | ORAL | Status: AC
  Administered 2018-04-19: 30 mL via ORAL
  Filled 2018-04-19: qty 30

## 2018-04-19 MED ORDER — MORPHINE SULFATE (PF) 4 MG/ML IV SOLN
4.0000 mg | Freq: Once | INTRAVENOUS | Status: AC
  Administered 2018-04-19: 4 mg via INTRAVENOUS
  Filled 2018-04-19: qty 1

## 2018-04-19 MED ORDER — ONDANSETRON 4 MG PO TBDP
4.0000 mg | ORAL_TABLET | Freq: Once | ORAL | Status: AC
  Administered 2018-04-19: 4 mg via ORAL
  Filled 2018-04-19: qty 1

## 2018-04-19 MED ORDER — ONDANSETRON HCL 4 MG/2ML IJ SOLN
4.0000 mg | Freq: Once | INTRAMUSCULAR | Status: AC
  Administered 2018-04-19: 4 mg via INTRAVENOUS
  Filled 2018-04-19: qty 2

## 2018-04-19 NOTE — ED Notes (Signed)
Results reviewed, no changes in acuity at this time 

## 2018-04-19 NOTE — ED Notes (Signed)
Pt ambulatory to the restroom independently.  

## 2018-04-19 NOTE — ED Triage Notes (Signed)
Pt presents to ED for sudden onset of LUQ pain after eating lunch.   Patient c/o nausea since, denies vomiting.  C/o  Diarrhea x 3 days, denies changes in urination.

## 2018-04-19 NOTE — ED Notes (Signed)
Patient able to get to restroom with the assistance of family.  Updated on delays and wait times

## 2018-04-19 NOTE — ED Provider Notes (Signed)
[facility] EMERGENCY DEPARTMENT Provider Note   CSN: [id number] Arrival date & time: 04/19/18  1653     History   Chief Complaint Chief Complaint  Patient presents with  . Abdominal Pain    HPI Anna [name] is a 47 y.o. female.  The history is provided by the patient and medical records. No language interpreter was used.  Abdominal Pain       47 year old female with history of chronic abdominal pain, GERD, anemia presenting to ED for evaluation of abdominal pain.  Patient reports sudden onset of left-sided abdominal pain that radiates to her back, 10 out of 10, started approximately 7 hours ago.  Pain has been persistent, with associated nausea and nonbloody nonbilious vomiting.  She has normal bowel movement.  She endorsed chills.  She denies fever, lightheadedness, dizziness, chest pain, shortness of breath, productive cough, dysuria.  She is currently on the fourth day of her menstruation.  She does have history of anemia.  She denies any recent strenuous activities or heavy lifting.  She denies having similar pain like this in the past.  Past Medical History:  Diagnosis Date  . Anemia   . Asthma   . Chronic abdominal pain   . GERD (gastroesophageal reflux disease)   . Hypertension   . Insomnia   . Migraine     Patient Active Problem List   Diagnosis Date Noted  . Plantar fasciitis of right foot 07/31/2013  . Right ankle pain 01/12/2013  . Left knee pain 05/08/2012    Past Surgical History:  Procedure Laterality Date  . ANKLE SURGERY    . DILATION AND CURETTAGE OF UTERUS    . FOOT SURGERY    . GASTRIC BYPASS    . TUBAL LIGATION       OB History   None      Home Medications    Prior to Admission medications   Medication Sig Start Date End Date Taking? Authorizing Provider  amLODipine (NORVASC) 10 MG tablet  05/04/12   [provider]  calcium citrate-vitamin D (CITRACAL+D) 315-200 MG-UNIT per tablet Take 1 tablet by  mouth 2 (two) times daily.    [provider]  dicyclomine (BENTYL) 20 MG tablet Take 1 tablet (20 mg total) by mouth 2 (two) times daily. 11/02/17   Palumbo, April, MD  esomeprazole (NEXIUM) 40 MG capsule Take 40 mg by mouth at bedtime.      [provider]  Fe Cbn-Fe Gluc-FA-B12-C-DSS (FERRALET 90) 90-1 MG TABS  04/29/12   [provider]  ferrous sulfate 325 (65 FE) MG tablet Take 325 mg by mouth 2 (two) times daily with a meal.     [provider]  HYDROcodone-acetaminophen (NORCO) 10-325 MG tablet Take 1 tablet by mouth every 4 (four) hours as needed.    [provider]  metoCLOPramide (REGLAN) 10 MG tablet Take 1 tablet (10 mg total) by mouth 2 (two) times daily as needed (headache). 01/02/16   Tomasita Crumble, MD  MULTIPLE VITAMIN PO Take by mouth.    [provider]  omeprazole (PRILOSEC) 20 MG capsule Take 20 mg by mouth daily.    [provider]  polyethylene glycol powder (MIRALAX) powder Take 17 g by mouth daily. 11/02/17   Palumbo, April, MD  potassium chloride (KLOR-CON) 20 MEQ packet Take by mouth 2 (two) times daily.    [provider]  promethazine (PHENERGAN) 25 MG tablet Take 1 tablet (25 mg total) by mouth every  6 (six) hours as needed for nausea or vomiting. 10/08/17   Ward, Layla Maw, DO  sucralfate (CARAFATE) 1 GM/10ML suspension Take 10 mLs (1 g total) by mouth 4 (four) times daily. 11/22/12   Palumbo, April, MD  Thiamine HCl (VITAMIN B-1) 250 MG tablet Take 250 mg by mouth daily.    [provider]  topiramate (TOPAMAX) 200 MG tablet Take 200 mg by mouth daily. For migraines    [provider]  traMADol (ULTRAM) 50 MG tablet Take 1 tablet (50 mg total) by mouth every 8 (eight) hours as needed for pain. 02/22/13   Hudnall, Azucena Fallen, MD  zolpidem (AMBIEN) 10 MG tablet Take 10 mg by mouth at bedtime. sleep    [provider]    Family History Family History  Problem Relation Age of Onset    . Hypertension Mother   . Hypertension Father   . Heart attack Father   . Diabetes Neg Hx   . Hyperlipidemia Neg Hx   . Sudden death Neg Hx     Social History Social History   Tobacco Use  . Smoking status: Never Smoker  . Smokeless tobacco: Never Used  Substance Use Topics  . Alcohol use: No  . Drug use: No     Allergies   Nsaids; Shellfish allergy; and Tamiflu [oseltamivir phosphate]   Review of Systems Review of Systems  Gastrointestinal: Positive for abdominal pain.  All other systems reviewed and are negative.    Physical Exam Updated Vital Signs BP 125/80   Pulse 79   Temp 97.9 F (36.6 C) (Oral)   Resp 18   LMP 04/19/2018   SpO2 100%   Physical Exam  Constitutional: She appears well-developed and well-nourished. No distress.  HENT:  Head: Atraumatic.  Eyes: Conjunctivae are normal.  Neck: Neck supple.  Cardiovascular: Normal rate and regular rhythm.  Pulmonary/Chest: Effort normal and breath sounds normal.  Abdominal: Soft. Normal appearance and bowel sounds are normal. There is tenderness in the left upper quadrant and left lower quadrant. No hernia.  Genitourinary:  Genitourinary Comments: Chaperone present during exam.  Normal rectal tone, no rectal mass, no fissure and no abnormal bleeding.  Normal color stool on glove.  Neurological: She is alert.  Skin: No rash noted.  Psychiatric: She has a normal mood and affect.  Nursing note and vitals reviewed.    ED Treatments / Results  Labs (all labs ordered are listed, but only abnormal results are displayed) Labs Reviewed  COMPREHENSIVE METABOLIC PANEL - Abnormal; Notable for the following components:      Result Value   Chloride 114 (*)    CO2 20 (*)    Glucose, Bld 106 (*)    Calcium 8.5 (*)    Albumin 3.4 (*)    AST 14 (*)    All other components within normal limits  CBC - Abnormal; Notable for the following components:   RBC 3.48 (*)    Hemoglobin 8.7 (*)    HCT 29.8 (*)    MCH  25.0 (*)    MCHC 29.2 (*)    RDW 15.9 (*)    All other components within normal limits  URINALYSIS, ROUTINE W REFLEX MICROSCOPIC - Abnormal; Notable for the following components:   Hgb urine dipstick MODERATE (*)    Bacteria, UA RARE (*)    All other components within normal limits  POC OCCULT BLOOD, ED - Abnormal; Notable for the following components:   Fecal Occult Bld POSITIVE (*)  All other components within normal limits  LIPASE, BLOOD  I-STAT BETA HCG BLOOD, ED ([facility], [facility], [facility] ONLY)    EKG None  Radiology Ct Abdomen Pelvis W Contrast  Result Date: 04/20/2018 CLINICAL DATA:  Sudden onset of left-sided abdominal pain. Nausea. Diverticulitis suspected. EXAM: CT ABDOMEN AND PELVIS WITH CONTRAST TECHNIQUE: Multidetector CT imaging of the abdomen and pelvis was performed using the standard protocol following bolus administration of intravenous contrast. CONTRAST:  OMNIPAQUE IOHEXOL 300 MG/ML  SOLN COMPARISON:  CT 10/08/2017 FINDINGS: Lower chest: The lung bases are clear. Hepatobiliary: No focal liver abnormality is seen. No gallstones, gallbladder wall thickening, or biliary dilatation. Pancreas: No ductal dilatation or inflammation. Spleen: Normal in size without focal abnormality. Adrenals/Urinary Tract: Normal adrenal glands. No hydronephrosis or perinephric edema. Homogeneous renal enhancement with symmetric excretion on delayed phase imaging. Tiny cysts in the left kidney. Urinary bladder is physiologically distended without wall thickening. Stomach/Bowel: Post gastric bypass. The excluded gastric remnant is unchanged in appearance. The jejunal anastomosis has changed in position located in the right abdomen, previously in the central abdomen. Adjacent small bowel loops are edematous and thick walled with adjacent mesenteric edema, there is mild mesenteric swirling. The cecum is not located in the right upper quadrant. Moderate to large volume of colonic stool. Vascular/Lymphatic: Mild  swirling of mesenteric vasculature. No portal venous or mesenteric gas. No adenopathy. Reproductive: Uterus is bulbous with enhancing fibroid posteriorly. Possible tampon in the vagina. No adnexal mass. Other: Mesenteric edema and fluid in the right abdomen at site of jejunal anastomosis. No free air. No evidence of intra-abdominal abscess. Postsurgical change of the anterior abdominal wall post abdominal plasty. Musculoskeletal: There are no acute or suspicious osseous abnormalities. IMPRESSION: 1. Post gastric bypass. Findings suspicious for internal hernia with change in position of the jejunal anastomosis since prior exam and edematous appearing bowel loops, mesenteric edema and fluid. Recommend surgical consultation. 2. Uterine fibroid. These results were called by telephone at the time of interpretation on 04/20/2018 at 12:55 am toPA Inocencio Roy , who verbally acknowledged these results. Electronically Signed   By: Narda Rutherford M.D.   On: 04/20/2018 00:55    Procedures Procedures (including critical care time)  Medications Ordered in ED Medications  ondansetron (ZOFRAN-ODT) disintegrating tablet 4 mg (4 mg Oral Given by Other 04/19/18 2251)  gi cocktail (Maalox,Lidocaine,Donnatal) (30 mLs Oral Given 04/19/18 2325)  morphine 4 MG/ML injection 4 mg (4 mg Intravenous Given 04/19/18 2325)  ondansetron (ZOFRAN) injection 4 mg (4 mg Intravenous Given 04/19/18 2325)  iohexol (OMNIPAQUE) 300 MG/ML solution 100 mL (100 mLs Intravenous Contrast Given 04/20/18 0044)  morphine 4 MG/ML injection (4 mg  Given 04/20/18 0603)  zolpidem (AMBIEN) 5 MG tablet (12.5 mg  Given 04/20/18 0603)     Initial Impression / Assessment and Plan / ED Course  I have reviewed the triage vital signs and the nursing notes.  Pertinent labs & imaging results that were available during my care of the patient were reviewed by me and considered in my medical decision making (see chart for details).     BP 125/80   Pulse 79    Temp 97.9 F (36.6 C) (Oral)   Resp 18   LMP 04/19/2018   SpO2 100%    Final Clinical Impressions(s) / ED Diagnoses   Final diagnoses:  Obstructed internal hernia    ED Discharge Orders    None     11:16 PM Patient here with sudden onset of left-sided abdominal pain.  Also history of anemia.  Labs remarkable for hemoglobin of 8.7, will obtain Hemoccult.  She is current on her menstruation.  Her labs otherwise reassuring normal lipase.  Given her discomfort, will obtain abdominal pelvic CT scan for further valuation.  Her fecal occult test is positive however she is currently on her menstruation which can likely cause a false positive.  Normal color stool.  Abdominal pelvis CT scan showing findings suspicious for internal hernia and edematous appearing bowel loops.  This finding was discussed with on-call surgeon, Dr. Cliffton Asters who will see patient in the ED and also admitted for further management of her condition.  Patient was made aware of plan and agrees with plan.   Fayrene Helper, PA-C 04/20/18 1117    Bethann Berkshire, MD 04/21/18 1224

## 2018-04-19 NOTE — ED Provider Notes (Signed)
Patient placed in Quick Look pathway, seen and evaluated   Chief Complaint: abdominal pain  HPI:   Anna [name] is a 47 y.o. female who presents to the ED with abdominal pain. The pain started suddenly today. Patient reports nausea but no vomiting. The pain is located in the LUQ and started after eating.   ROS: GI: abdominal pain, nausea  Physical Exam:  BP (!) 132/91 (BP Location: Right Arm)   Pulse 78   Temp 97.9 F (36.6 C) (Oral)   Resp 18   LMP 04/19/2018   SpO2 100%    Gen: No distress  Neuro: Awake and Alert  Skin: Warm and dry  Abdomen: tender with palpation LUQ, no guarding or rebound    Initiation of care has begun. The patient has been counseled on the process, plan, and necessity for staying for the completion/evaluation, and the remainder of the medical screening examination    Janne Napoleon, NP 04/19/18 1747    Tegeler, Canary Brim, MD 04/20/18 [phone]

## 2018-04-20 ENCOUNTER — Inpatient Hospital Stay (HOSPITAL_COMMUNITY): Payer: BLUE CROSS/BLUE SHIELD

## 2018-04-20 ENCOUNTER — Inpatient Hospital Stay (HOSPITAL_COMMUNITY): Payer: BLUE CROSS/BLUE SHIELD | Admitting: Anesthesiology

## 2018-04-20 ENCOUNTER — Encounter (HOSPITAL_COMMUNITY): Admission: EM | Disposition: A | Discharge status: Home / Self Care | Payer: Self-pay | Source: Home / Self Care

## 2018-04-20 ENCOUNTER — Emergency Department (HOSPITAL_COMMUNITY): Payer: BLUE CROSS/BLUE SHIELD

## 2018-04-20 ENCOUNTER — Encounter (HOSPITAL_COMMUNITY): Payer: Self-pay | Admitting: *Deleted

## 2018-04-20 DIAGNOSIS — Z9884 Bariatric surgery status: Secondary | ICD-10-CM | POA: Diagnosis not present

## 2018-04-20 DIAGNOSIS — K660 Peritoneal adhesions (postprocedural) (postinfection): Secondary | ICD-10-CM | POA: Diagnosis present

## 2018-04-20 DIAGNOSIS — K450 Other specified abdominal hernia with obstruction, without gangrene: Secondary | ICD-10-CM | POA: Diagnosis present

## 2018-04-20 DIAGNOSIS — K458 Other specified abdominal hernia without obstruction or gangrene: Secondary | ICD-10-CM | POA: Diagnosis present

## 2018-04-20 DIAGNOSIS — G4700 Insomnia, unspecified: Secondary | ICD-10-CM | POA: Diagnosis present

## 2018-04-20 DIAGNOSIS — K5909 Other constipation: Secondary | ICD-10-CM | POA: Diagnosis present

## 2018-04-20 DIAGNOSIS — Z888 Allergy status to other drugs, medicaments and biological substances status: Secondary | ICD-10-CM | POA: Diagnosis not present

## 2018-04-20 DIAGNOSIS — Z79899 Other long term (current) drug therapy: Secondary | ICD-10-CM | POA: Diagnosis not present

## 2018-04-20 DIAGNOSIS — G43909 Migraine, unspecified, not intractable, without status migrainosus: Secondary | ICD-10-CM | POA: Diagnosis present

## 2018-04-20 DIAGNOSIS — K219 Gastro-esophageal reflux disease without esophagitis: Secondary | ICD-10-CM | POA: Diagnosis present

## 2018-04-20 DIAGNOSIS — R109 Unspecified abdominal pain: Secondary | ICD-10-CM | POA: Diagnosis present

## 2018-04-20 DIAGNOSIS — I10 Essential (primary) hypertension: Secondary | ICD-10-CM | POA: Diagnosis present

## 2018-04-20 DIAGNOSIS — J45909 Unspecified asthma, uncomplicated: Secondary | ICD-10-CM | POA: Diagnosis present

## 2018-04-20 HISTORY — PX: LYSIS OF ADHESION: SHX5961

## 2018-04-20 HISTORY — PX: VENTRAL HERNIA REPAIR: SHX424

## 2018-04-20 HISTORY — PX: LAPAROSCOPY ABDOMEN DIAGNOSTIC: PRO50

## 2018-04-20 SURGERY — REPAIR, HERNIA, VENTRAL, LAPAROSCOPIC
Anesthesia: General | Site: Abdomen

## 2018-04-20 MED ORDER — ZOLPIDEM TARTRATE 5 MG PO TABS: 5.0000 mg | ORAL_TABLET | Freq: Once | ORAL | Status: DC

## 2018-04-20 MED ORDER — HYDRALAZINE HCL 20 MG/ML IJ SOLN: 10.0000 mg | INTRAMUSCULAR | Status: DC | PRN

## 2018-04-20 MED ORDER — POLYETHYLENE GLYCOL 3350 17 GM/SCOOP PO POWD
17.0000 g | Freq: Every day | ORAL | Status: DC
  Administered 2018-04-20 – 2018-04-21 (×2): 17 g via ORAL
  Filled 2018-04-20: qty 255

## 2018-04-20 MED ORDER — ZOLPIDEM TARTRATE 5 MG PO TABS
10.0000 mg | ORAL_TABLET | Freq: Every evening | ORAL | Status: DC | PRN
  Administered 2018-04-20: 10 mg via ORAL
  Filled 2018-04-20: qty 2

## 2018-04-20 MED ORDER — SUCCINYLCHOLINE CHLORIDE 20 MG/ML IJ SOLN
INTRAMUSCULAR | Status: DC | PRN
  Administered 2018-04-20: 120 mg via INTRAVENOUS

## 2018-04-20 MED ORDER — STERILE WATER FOR IRRIGATION IR SOLN
Status: DC | PRN
  Administered 2018-04-20: 1000 mL

## 2018-04-20 MED ORDER — SUGAMMADEX SODIUM 200 MG/2ML IV SOLN
INTRAVENOUS | Status: DC | PRN
  Administered 2018-04-20: 150 mg via INTRAVENOUS

## 2018-04-20 MED ORDER — DEXAMETHASONE SODIUM PHOSPHATE 10 MG/ML IJ SOLN
INTRAMUSCULAR | Status: DC | PRN
  Administered 2018-04-20: 5 mg via INTRAVENOUS

## 2018-04-20 MED ORDER — ZOLPIDEM TARTRATE 5 MG PO TABS
ORAL_TABLET | ORAL | Status: AC
  Administered 2018-04-20: 12.5 mg
  Filled 2018-04-20: qty 3

## 2018-04-20 MED ORDER — MIDAZOLAM HCL 2 MG/2ML IJ SOLN
INTRAMUSCULAR | Status: DC | PRN
  Administered 2018-04-20: 2 mg via INTRAVENOUS

## 2018-04-20 MED ORDER — PANTOPRAZOLE SODIUM 40 MG PO TBEC
40.0000 mg | DELAYED_RELEASE_TABLET | Freq: Every day | ORAL | Status: DC
  Administered 2018-04-20 – 2018-04-21 (×2): 40 mg via ORAL
  Filled 2018-04-20: qty 1

## 2018-04-20 MED ORDER — EPHEDRINE 5 MG/ML INJ
INTRAVENOUS | Status: AC
  Filled 2018-04-20: qty 10

## 2018-04-20 MED ORDER — LACTATED RINGERS IV SOLN
INTRAVENOUS | Status: DC
  Administered 2018-04-20 (×2): via INTRAVENOUS

## 2018-04-20 MED ORDER — OXYCODONE HCL 5 MG PO TABS: 5.0000 mg | ORAL_TABLET | Freq: Once | ORAL | Status: DC | PRN

## 2018-04-20 MED ORDER — ACETAMINOPHEN 325 MG PO TABS: 650.0000 mg | ORAL_TABLET | Freq: Four times a day (QID) | ORAL | Status: DC | PRN

## 2018-04-20 MED ORDER — ROCURONIUM BROMIDE 50 MG/5ML IV SOSY
PREFILLED_SYRINGE | INTRAVENOUS | Status: AC
  Filled 2018-04-20: qty 5

## 2018-04-20 MED ORDER — IOHEXOL 300 MG/ML  SOLN
100.0000 mL | Freq: Once | INTRAMUSCULAR | Status: AC | PRN
  Administered 2018-04-20: 100 mL via INTRAVENOUS

## 2018-04-20 MED ORDER — BUPIVACAINE-EPINEPHRINE 0.25% -1:200000 IJ SOLN
INTRAMUSCULAR | Status: DC | PRN
  Administered 2018-04-20: 10 mL

## 2018-04-20 MED ORDER — ZOLPIDEM TARTRATE 5 MG PO TABS
5.0000 mg | ORAL_TABLET | Freq: Every evening | ORAL | Status: DC | PRN
  Filled 2018-04-20: qty 1

## 2018-04-20 MED ORDER — ROCURONIUM BROMIDE 10 MG/ML (PF) SYRINGE
PREFILLED_SYRINGE | INTRAVENOUS | Status: DC | PRN
  Administered 2018-04-20: 50 mg via INTRAVENOUS

## 2018-04-20 MED ORDER — FENTANYL CITRATE (PF) 250 MCG/5ML IJ SOLN
INTRAMUSCULAR | Status: AC
  Filled 2018-04-20: qty 5

## 2018-04-20 MED ORDER — HEPARIN SODIUM (PORCINE) 5000 UNIT/ML IJ SOLN
5000.0000 [IU] | Freq: Three times a day (TID) | INTRAMUSCULAR | Status: DC
  Administered 2018-04-20 – 2018-04-21 (×3): 5000 [IU] via SUBCUTANEOUS
  Filled 2018-04-20 (×3): qty 1

## 2018-04-20 MED ORDER — ONDANSETRON HCL 4 MG/2ML IJ SOLN
INTRAMUSCULAR | Status: AC
  Filled 2018-04-20: qty 2

## 2018-04-20 MED ORDER — LACTATED RINGERS IV SOLN: INTRAVENOUS | Status: DC

## 2018-04-20 MED ORDER — FENTANYL CITRATE (PF) 250 MCG/5ML IJ SOLN
INTRAMUSCULAR | Status: DC | PRN
  Administered 2018-04-20: 100 ug via INTRAVENOUS

## 2018-04-20 MED ORDER — LIDOCAINE 2% (20 MG/ML) 5 ML SYRINGE
INTRAMUSCULAR | Status: AC
  Filled 2018-04-20: qty 5

## 2018-04-20 MED ORDER — SODIUM CHLORIDE 0.9 % IR SOLN
Status: DC | PRN
  Administered 2018-04-20: 1000 mL

## 2018-04-20 MED ORDER — MIDAZOLAM HCL 2 MG/2ML IJ SOLN
INTRAMUSCULAR | Status: AC
  Filled 2018-04-20: qty 2

## 2018-04-20 MED ORDER — DIPHENHYDRAMINE HCL 12.5 MG/5ML PO ELIX
12.5000 mg | ORAL_SOLUTION | Freq: Four times a day (QID) | ORAL | Status: DC | PRN
  Administered 2018-04-21: 12.5 mg via ORAL
  Filled 2018-04-20: qty 10

## 2018-04-20 MED ORDER — SCOPOLAMINE 1 MG/3DAYS TD PT72
MEDICATED_PATCH | TRANSDERMAL | Status: AC
  Filled 2018-04-20: qty 2

## 2018-04-20 MED ORDER — DICYCLOMINE HCL 20 MG PO TABS: 20.0000 mg | ORAL_TABLET | Freq: Two times a day (BID) | ORAL | Status: DC

## 2018-04-20 MED ORDER — FENTANYL CITRATE (PF) 100 MCG/2ML IJ SOLN
INTRAMUSCULAR | Status: AC
  Filled 2018-04-20: qty 2

## 2018-04-20 MED ORDER — PROPOFOL 10 MG/ML IV BOLUS
INTRAVENOUS | Status: AC
  Filled 2018-04-20: qty 20

## 2018-04-20 MED ORDER — FERROUS SULFATE 325 (65 FE) MG PO TABS
325.0000 mg | ORAL_TABLET | Freq: Two times a day (BID) | ORAL | Status: DC
  Administered 2018-04-21: 325 mg via ORAL
  Filled 2018-04-20 (×2): qty 1

## 2018-04-20 MED ORDER — VITAMIN B-1 100 MG PO TABS
250.0000 mg | ORAL_TABLET | Freq: Every day | ORAL | Status: DC
  Administered 2018-04-20 – 2018-04-21 (×2): 250 mg via ORAL
  Filled 2018-04-20: qty 3

## 2018-04-20 MED ORDER — PROMETHAZINE HCL 25 MG/ML IJ SOLN: 6.2500 mg | INTRAMUSCULAR | Status: DC | PRN

## 2018-04-20 MED ORDER — PHENYLEPHRINE 40 MCG/ML (10ML) SYRINGE FOR IV PUSH (FOR BLOOD PRESSURE SUPPORT)
PREFILLED_SYRINGE | INTRAVENOUS | Status: AC
  Filled 2018-04-20: qty 10

## 2018-04-20 MED ORDER — SODIUM CHLORIDE 0.9 % IV SOLN
2.0000 g | INTRAVENOUS | Status: AC
  Administered 2018-04-20: 2 g via INTRAVENOUS
  Filled 2018-04-20: qty 2

## 2018-04-20 MED ORDER — PREMIER PROTEIN SHAKE
2.0000 [oz_av] | ORAL | Status: DC
  Administered 2018-04-20 – 2018-04-21 (×5): 2 [oz_av] via ORAL
  Filled 2018-04-20 (×10): qty 325.31

## 2018-04-20 MED ORDER — HYDROMORPHONE HCL 1 MG/ML IJ SOLN
0.5000 mg | INTRAMUSCULAR | Status: DC | PRN
  Administered 2018-04-20 (×2): 0.5 mg via INTRAVENOUS
  Filled 2018-04-20 (×2): qty 1

## 2018-04-20 MED ORDER — POTASSIUM CHLORIDE IN NACL 20-0.9 MEQ/L-% IV SOLN
INTRAVENOUS | Status: DC
  Administered 2018-04-20 – 2018-04-21 (×3): via INTRAVENOUS
  Filled 2018-04-20 (×3): qty 1000

## 2018-04-20 MED ORDER — SIMETHICONE 80 MG PO CHEW: 40.0000 mg | CHEWABLE_TABLET | Freq: Four times a day (QID) | ORAL | Status: DC | PRN

## 2018-04-20 MED ORDER — BUPIVACAINE-EPINEPHRINE (PF) 0.25% -1:200000 IJ SOLN
INTRAMUSCULAR | Status: AC
  Filled 2018-04-20: qty 30

## 2018-04-20 MED ORDER — DEXAMETHASONE SODIUM PHOSPHATE 10 MG/ML IJ SOLN
INTRAMUSCULAR | Status: AC
  Filled 2018-04-20: qty 1

## 2018-04-20 MED ORDER — SODIUM CHLORIDE 0.9 % IV SOLN
INTRAVENOUS | Status: DC | PRN
  Administered 2018-04-20: 40 ug/min via INTRAVENOUS

## 2018-04-20 MED ORDER — OXYCODONE HCL 5 MG/5ML PO SOLN: 5.0000 mg | Freq: Once | ORAL | Status: DC | PRN

## 2018-04-20 MED ORDER — DIPHENHYDRAMINE HCL 50 MG/ML IJ SOLN: 12.5000 mg | Freq: Four times a day (QID) | INTRAMUSCULAR | Status: DC | PRN

## 2018-04-20 MED ORDER — TOPIRAMATE 100 MG PO TABS
200.0000 mg | ORAL_TABLET | Freq: Every day | ORAL | Status: DC
  Administered 2018-04-20 – 2018-04-21 (×2): 200 mg via ORAL
  Filled 2018-04-20 (×2): qty 2

## 2018-04-20 MED ORDER — PHENYLEPHRINE HCL 10 MG/ML IJ SOLN
INTRAMUSCULAR | Status: DC | PRN
  Administered 2018-04-20: 120 ug via INTRAVENOUS
  Administered 2018-04-20 (×4): 80 ug via INTRAVENOUS

## 2018-04-20 MED ORDER — ONDANSETRON HCL 4 MG/2ML IJ SOLN: 4.0000 mg | Freq: Four times a day (QID) | INTRAMUSCULAR | Status: DC | PRN

## 2018-04-20 MED ORDER — SUCRALFATE 1 GM/10ML PO SUSP
1.0000 g | Freq: Four times a day (QID) | ORAL | Status: DC
  Administered 2018-04-20 – 2018-04-21 (×4): 1 g via ORAL
  Filled 2018-04-20 (×4): qty 10

## 2018-04-20 MED ORDER — FENTANYL CITRATE (PF) 100 MCG/2ML IJ SOLN
25.0000 ug | INTRAMUSCULAR | Status: DC | PRN
  Administered 2018-04-20: 50 ug via INTRAVENOUS
  Administered 2018-04-20 (×2): 25 ug via INTRAVENOUS

## 2018-04-20 MED ORDER — METOCLOPRAMIDE HCL 10 MG PO TABS: 10.0000 mg | ORAL_TABLET | Freq: Two times a day (BID) | ORAL | Status: DC | PRN

## 2018-04-20 MED ORDER — ONDANSETRON HCL 4 MG/2ML IJ SOLN
INTRAMUSCULAR | Status: DC | PRN
  Administered 2018-04-20: 4 mg via INTRAVENOUS

## 2018-04-20 MED ORDER — MORPHINE SULFATE (PF) 4 MG/ML IV SOLN
INTRAVENOUS | Status: AC
  Administered 2018-04-20: 4 mg
  Filled 2018-04-20: qty 1

## 2018-04-20 MED ORDER — ONDANSETRON 4 MG PO TBDP: 4.0000 mg | ORAL_TABLET | Freq: Four times a day (QID) | ORAL | Status: DC | PRN

## 2018-04-20 MED ORDER — SUCCINYLCHOLINE CHLORIDE 200 MG/10ML IV SOSY
PREFILLED_SYRINGE | INTRAVENOUS | Status: AC
  Filled 2018-04-20: qty 10

## 2018-04-20 MED ORDER — TRAMADOL HCL 50 MG PO TABS
50.0000 mg | ORAL_TABLET | Freq: Four times a day (QID) | ORAL | Status: DC | PRN
  Administered 2018-04-20 – 2018-04-21 (×2): 50 mg via ORAL
  Filled 2018-04-20 (×3): qty 1

## 2018-04-20 MED ORDER — PROPOFOL 10 MG/ML IV BOLUS
INTRAVENOUS | Status: DC | PRN
  Administered 2018-04-20: 180 mg via INTRAVENOUS

## 2018-04-20 MED ORDER — 0.9 % SODIUM CHLORIDE (POUR BTL) OPTIME
TOPICAL | Status: DC | PRN
  Administered 2018-04-20: 1000 mL

## 2018-04-20 MED ORDER — LIDOCAINE 2% (20 MG/ML) 5 ML SYRINGE
INTRAMUSCULAR | Status: DC | PRN
  Administered 2018-04-20: 80 mg via INTRAVENOUS

## 2018-04-20 SURGICAL SUPPLY — 44 items
ADH SKN CLS APL DERMABOND .7 (GAUZE/BANDAGES/DRESSINGS) ×1
APPLIER CLIP 5 13 M/L LIGAMAX5 (MISCELLANEOUS)
APR CLP MED LRG 5 ANG JAW (MISCELLANEOUS)
BINDER ABDOMINAL 12 ML 46-62 (SOFTGOODS) IMPLANT
CHLORAPREP W/TINT 26ML (MISCELLANEOUS) ×3 IMPLANT
CLIP APPLIE 5 13 M/L LIGAMAX5 (MISCELLANEOUS) IMPLANT
COVER SURGICAL LIGHT HANDLE (MISCELLANEOUS) ×3 IMPLANT
DECANTER SPIKE VIAL GLASS SM (MISCELLANEOUS) ×3 IMPLANT
DERMABOND ADVANCED (GAUZE/BANDAGES/DRESSINGS) ×2
DERMABOND ADVANCED .7 DNX12 (GAUZE/BANDAGES/DRESSINGS) ×1 IMPLANT
DEVICE SECURE STRAP 25 ABSORB (INSTRUMENTS) IMPLANT
ELECT REM PT RETURN 9FT ADLT (ELECTROSURGICAL) ×3
ELECTRODE REM PT RTRN 9FT ADLT (ELECTROSURGICAL) ×1 IMPLANT
GLOVE BIO SURGEON STRL SZ 6.5 (GLOVE) ×1 IMPLANT
GLOVE BIO SURGEON STRL SZ7 (GLOVE) ×1 IMPLANT
GLOVE BIO SURGEONS STRL SZ 6.5 (GLOVE) ×1
GLOVE BIOGEL PI IND STRL 6.5 (GLOVE) IMPLANT
GLOVE BIOGEL PI IND STRL 7.0 (GLOVE) ×1 IMPLANT
GLOVE BIOGEL PI INDICATOR 6.5 (GLOVE) ×2
GLOVE BIOGEL PI INDICATOR 7.0 (GLOVE) ×6
GLOVE SURG SIGNA 7.5 PF LTX (GLOVE) ×2 IMPLANT
GLOVE SURG SS PI 7.0 STRL IVOR (GLOVE) ×4 IMPLANT
GOWN STRL REUS W/ TWL XL LVL3 (GOWN DISPOSABLE) IMPLANT
GOWN STRL REUS W/TWL LRG LVL3 (GOWN DISPOSABLE) ×7 IMPLANT
GOWN STRL REUS W/TWL XL LVL3 (GOWN DISPOSABLE) ×6
GRASPER SUT TROCAR 14GX15 (MISCELLANEOUS) ×1 IMPLANT
KIT BASIN OR (CUSTOM PROCEDURE TRAY) ×3 IMPLANT
MARKER SKIN DUAL TIP RULER LAB (MISCELLANEOUS) ×1 IMPLANT
NEEDLE 22X1 1/2 (OR ONLY) (NEEDLE) ×1 IMPLANT
SCISSORS LAP 5X35 DISP (ENDOMECHANICALS) ×3 IMPLANT
SET IRRIG TUBING LAPAROSCOPIC (IRRIGATION / IRRIGATOR) ×2 IMPLANT
SHEARS HARMONIC ACE PLUS 36CM (ENDOMECHANICALS) IMPLANT
SLEEVE ENDOPATH XCEL 5M (ENDOMECHANICALS) ×5 IMPLANT
SUT MNCRL AB 4-0 PS2 18 (SUTURE) ×3 IMPLANT
SUT NOVA NAB GS-21 0 18 T12 DT (SUTURE) ×1 IMPLANT
SUT PROLENE 0 CT 1 CR/8 (SUTURE) IMPLANT
TOWEL OR 17X24 6PK STRL BLUE (TOWEL DISPOSABLE) ×3 IMPLANT
TOWEL OR 17X26 10 PK STRL BLUE (TOWEL DISPOSABLE) ×3 IMPLANT
TRAY LAPAROSCOPIC (CUSTOM PROCEDURE TRAY) ×3 IMPLANT
TROCAR XCEL 12X100 BLDLESS (ENDOMECHANICALS) ×3 IMPLANT
TROCAR XCEL NON-BLD 11X100MML (ENDOMECHANICALS) ×1 IMPLANT
TROCAR XCEL NON-BLD 5MMX100MML (ENDOMECHANICALS) ×3 IMPLANT
TUBING INSUFFLATION (TUBING) ×3 IMPLANT
WATER STERILE IRR 1000ML POUR (IV SOLUTION) ×3 IMPLANT

## 2018-04-20 NOTE — Anesthesia Procedure Notes (Signed)
Procedure Name: Intubation Date/Time: 04/20/2018 10:54 AM Performed by: White, Amedeo Plenty, CRNA Pre-anesthesia Checklist: Patient identified, Emergency Drugs available, Suction available and Patient being monitored Patient Re-evaluated:Patient Re-evaluated prior to induction Oxygen Delivery Method: Circle System Utilized Preoxygenation: Pre-oxygenation with 100% oxygen Induction Type: IV induction, Rapid sequence and Cricoid Pressure applied Laryngoscope Size: Mac and 3 Grade View: Grade I Tube type: Oral Tube size: 7.0 mm Number of attempts: 1 Airway Equipment and Method: Stylet and Oral airway Placement Confirmation: ETT inserted through vocal cords under direct vision,  positive ETCO2 and breath sounds checked- equal and bilateral Secured at: 22 cm Tube secured with: Tape Dental Injury: Teeth and Oropharynx as per pre-operative assessment

## 2018-04-20 NOTE — Anesthesia Preprocedure Evaluation (Addendum)
Anesthesia Evaluation  Patient identified by MRN, date of birth, ID band Patient awake    Reviewed: Allergy & Precautions, NPO status , Patient's Chart, lab work & pertinent test results  History of Anesthesia Complications Negative for: history of anesthetic complications  Airway Mallampati: II  TM Distance: >3 FB Neck ROM: Full    Dental  (+) Dental Advisory Given, Partial Upper   Pulmonary asthma ,    breath sounds clear to auscultation       Cardiovascular hypertension, Pt. on medications  Rhythm:Regular Rate:Bradycardia     Neuro/Psych  Headaches, negative psych ROS   GI/Hepatic Neg liver ROS, GERD  Medicated, Obstructed internal hernia / SBO     Endo/Other  negative endocrine ROS  Renal/GU negative Renal ROS  negative genitourinary   Musculoskeletal negative musculoskeletal ROS (+)   Abdominal   Peds  Hematology  (+) anemia ,   Anesthesia Other Findings Insomnia   Reproductive/Obstetrics                           Anesthesia Physical Anesthesia Plan  ASA: II and emergent  Anesthesia Plan: General   Post-op Pain Management:    Induction: Intravenous and Rapid sequence  PONV Risk Score and Plan: 4 or greater and Treatment may vary due to age or medical condition, Ondansetron, Scopolamine patch - Pre-op, Midazolam and Dexamethasone  Airway Management Planned: Oral ETT  Additional Equipment: None  Intra-op Plan:   Post-operative Plan: Extubation in OR  Informed Consent: I have reviewed the patients History and Physical, chart, labs and discussed the procedure including the risks, benefits and alternatives for the proposed anesthesia with the patient or authorized representative who has indicated his/her understanding and acceptance.   Dental advisory given  Plan Discussed with: CRNA and Anesthesiologist  Anesthesia Plan Comments:        Anesthesia Quick  Evaluation

## 2018-04-20 NOTE — Progress Notes (Signed)
Pt has signed consent forms and blood transufusion concents with full underrstanding.

## 2018-04-20 NOTE — Op Note (Signed)
Preoperative diagnosis: epigastric pain, internal hernia  Postoperative diagnosis: epigastric pain, 2 bands causing internal hernia  Procedure: diagnostic laparoscopy, lysis of adhesions  Surgeon: Feliciana RossettiLuke Missy Baksh, M.D.  Asst: Carman Chingoug Blackman, M.D.  Anesthesia: gen  Indications for procedure: Tracie HarrierGwendolyn D [name] is a 47 y.o. year old female with symptoms of abdominal pain and vomiting. On workup her previous anastomosis were in odd locations and therefore decision was made to perform laparoscopy to examine for internal hernia.  Description of procedure: The patient was brought into the operative suite. Anesthesia was administered with General endotracheal anesthesia. WHO checklist was applied. The patient was then placed in supine position. The area was prepped and draped in the usual sterile fashion.  Next a small transverse left subcostal incision was made and a 5 mm trocar was used to gain access to the peritoneal cavity with optical entry technique.  Pneumoperitoneum was applied with a high flow and low pressure.  Laparoscope was reinserted to confirm placement.  On initial evaluation of the abdominal cavity there was one single band of omentum up to the mid abdomen.  2 additional 5 mm trochars were placed one in the left mid abdomen and one in the left lower abdomen.  Both trochars were placed under direct optic local visualization.  Next the single band of omentum up to the abdominal wall was lysed with cautery.  We began running the intestine by identifying the transverse colon and cecum starting at the terminal ileum we ran the small intestine retrograde fashion about halfway into the jejunal running there was a band overlying the intestine connecting from a jejunal mesentery to the right retroperitoneal area.  This was lysed and the band of omentum was removed.  After this we continued running the intestine identifying the jejunal jejunal anastomosis which showed to have some lymph edema in  the mesentery.  There are no signs of perforation or ischemia.  We continued running the intestine of the Roux limb and identified the GJ which appeared in normal condition.  We also ran the biliary limb which was in good location.  The entirety of the JJ was examined there appeared to be no internal hernia defect.  And the entire small intestine one additional time to ensure there was no additional twisting which there was not.  Therefore the pneumoperitoneum was removed and the skin was closed with 4-0 Monocryl in subcuticular fashion.  10 milliliters of Marcaine was put into the subcutaneous tissue around the incisions.  Dermabond was put in place with dressing.  Findings: Small band of omental tongue causing internal hernia of small intestine.  Specimen: None  Implant: None  Blood loss: Minimal  Local anesthesia: 10 ml marcaine   Complications: none  Feliciana RossettiLuke Aliviya Schoeller, M.D. General, Bariatric, & Minimally Invasive Surgery [facility]

## 2018-04-20 NOTE — Progress Notes (Signed)
CHG bath given 

## 2018-04-20 NOTE — Transfer of Care (Signed)
Immediate Anesthesia Transfer of Care Note  Patient: Anna [name]  Procedure(s) Performed: DIAGNOSTIC LAPAROSCOPY (N/A Abdomen) LAPAROSCOPIC LYSIS OF ADHESION (N/A Abdomen)  Patient Location: PACU  Anesthesia Type:General  Level of Consciousness: drowsy and patient cooperative  Airway & Oxygen Therapy: Patient Spontanous Breathing and Patient connected to nasal cannula oxygen  Post-op Assessment: Report given to RN and Post -op Vital signs reviewed and stable  Post vital signs: Reviewed and stable  Last Vitals:  Vitals Value Taken Time  BP 133/79 04/20/2018 11:50 AM  Temp    Pulse 75 04/20/2018 11:51 AM  Resp 15 04/20/2018 11:51 AM  SpO2 100 % 04/20/2018 11:51 AM  Vitals shown include unvalidated device data.  Last Pain:  Vitals:   04/20/18 0605  TempSrc: Oral  PainSc:          Complications: No apparent anesthesia complications

## 2018-04-20 NOTE — Progress Notes (Signed)
Patient is requesting for Ambien 12.5 mg. Per patient she received 12.5 mg in the ED. Paged Dr Sheliah HatchKinsinger ,orders received for 10 mg of Ambien. Patient made aware of dosage,

## 2018-04-20 NOTE — Progress Notes (Signed)
Patient ID: Tracie HarrierGwendolyn D Mcdaris, female   DOB: 1971/06/30, 47 y.o.   MRN: [id number]   Patient reports feeling dehydrated with only mild abdominal pain. I have discussed the case with 2 of our bariatric surgeons.  Her films are worrisome for an internal hernia.  A diagnostic laparoscopy has been recommended.  Dr. Sheliah HatchKinsinger plans on taking her to the OR and will meet her this morning

## 2018-04-20 NOTE — Progress Notes (Addendum)
Daughter has taken all of mothers personal  Property home and moms has her period at this time disposal underwear given to patient with pad. Report given to surgery unit

## 2018-04-20 NOTE — ED Notes (Signed)
EDP at bedside  

## 2018-04-20 NOTE — ED Notes (Signed)
Pt given another 4mg  Morphine IV per Greta DoomBowie PA and 12.5mg  Ambien PO per Dr. Cliffton AstersWhite during downtime. Report given and pt taken to 5N during downtime.

## 2018-04-20 NOTE — H&P (Signed)
CC: Abdominal cramps; consult by EDP for possible internal hernia following gastric bypass surgery  HPI: Anna [name] is an 47 y.o. female with hx of chronic abdominal pain, GERD, anemia whom underwent laparoscopic RYGB in 2015 at [facility] by Dr. Alvan Dame - presents to the ED overnight with onset of abdominal discomfort that is described as crampy which began in the afternoon on 04/19/18. 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 She thought it was bad gas cramps but it didn't go away quickly so she came in for further evaluation. She describes these pains as being in her left hemiabdomen. She has had this kind of pain off and on over the past 1 month. Nothing seems to make it better but with time they seem to improve. She had an episode of n/v today and eariler this month as well. She has been passing flatus here in the ER and having BMs today. She notes a history of chronic constipation as well and takes daily miralax for this. She denies pain in her right abdomen.   Past Medical History:  Diagnosis Date  . Anemia   . Asthma   . Chronic abdominal pain   . GERD (gastroesophageal reflux disease)   . Hypertension   . Insomnia   . Migraine     Past Surgical History:  Procedure Laterality Date  . ANKLE SURGERY    . DILATION AND CURETTAGE OF UTERUS    . FOOT SURGERY    . GASTRIC BYPASS    . TUBAL LIGATION     Tummy tuck since her bypass procedure  Family History  Problem Relation Age of Onset  . Hypertension Mother   . Hypertension Father   . Heart attack Father   . Diabetes Neg Hx   . Hyperlipidemia Neg Hx   . Sudden death Neg Hx     Social:  reports that she has never smoked. She has never used smokeless tobacco. She reports that she does not drink alcohol or use drugs.  Allergies:  Allergies  Allergen Reactions  . Nsaids Other (See Comments)    Unable to take due to gastric bypass  . Shellfish Allergy     Positive allergy test  .  Tamiflu [Oseltamivir Phosphate] Hives    Medications: I have reviewed the patient's current medications.  Results for orders placed or performed during the hospital encounter of 04/19/18 (from the past 48 hour(s))  Lipase, blood     Status: None   Collection Time: 04/19/18  5:55 PM  Result Value Ref Range   Lipase 38 11 - 51 U/L    Comment: Performed at [facility], [place] [address], [place], [place] [address]  Comprehensive metabolic panel     Status: Abnormal   Collection Time: 04/19/18  5:55 PM  Result Value Ref Range   Sodium 140 135 - 145 mmol/L   Potassium 3.9 3.5 - 5.1 mmol/L   Chloride 114 (H) 98 - 111 mmol/L   CO2 20 (L) 22 - 32 mmol/L   Glucose, Bld 106 (H) 70 - 99 mg/dL   BUN 16 6 - 20 mg/dL   Creatinine, Ser 0.80 0.44 - 1.00 mg/dL   Calcium 8.5 (L) 8.9 - 10.3 mg/dL   Total Protein 7.1 6.5 - 8.1 g/dL   Albumin 3.4 (L) 3.5 - 5.0 g/dL   AST 14 (L) 15 - 41 U/L   ALT 6 0 - 44 U/L   Alkaline Phosphatase 110 38 -  126 U/L   Total Bilirubin 0.4 0.3 - 1.2 mg/dL   GFR calc non Af Amer >60 >60 mL/min   GFR calc Af Amer >60 >60 mL/min    Comment: (NOTE) The eGFR has been calculated using the CKD EPI equation. This calculation has not been validated in all clinical situations. eGFR's persistently <60 mL/min signify possible Chronic Kidney Disease.    Anion gap 6 5 - 15    Comment: Performed at [facility] [address] [place], [place] [address]  CBC     Status: Abnormal   Collection Time: 04/19/18  5:55 PM  Result Value Ref Range   WBC 5.0 4.0 - 10.5 K/uL   RBC 3.48 (L) 3.87 - 5.11 MIL/uL   Hemoglobin 8.7 (L) 12.0 - 15.0 g/dL   HCT 29.8 (L) 36.0 - 46.0 %   MCV 85.6 78.0 - 100.0 fL   MCH 25.0 (L) 26.0 - 34.0 pg   MCHC 29.2 (L) 30.0 - 36.0 g/dL   RDW 15.9 (H) 11.5 - 15.5 %   Platelets 349 150 - 400 K/uL    Comment: Performed at [facility], [place] [address], [place], [place] [address]  I-Stat beta hCG blood, ED     Status: None   Collection Time:  04/19/18  5:59 PM  Result Value Ref Range   I-stat hCG, quantitative <5.0 <5 mIU/mL   Comment 3            Comment:   GEST. AGE      CONC.  (mIU/mL)   <=1 WEEK        5 - 50     2 WEEKS       50 - 500     3 WEEKS       100 - 10,000     4 WEEKS     1,000 - 30,000        FEMALE AND NON-PREGNANT FEMALE:     LESS THAN 5 mIU/mL   Urinalysis, Routine w reflex microscopic     Status: Abnormal   Collection Time: 04/19/18  8:47 PM  Result Value Ref Range   Color, Urine YELLOW YELLOW   APPearance CLEAR CLEAR   Specific Gravity, Urine 1.016 1.005 - 1.030   pH 6.0 5.0 - 8.0   Glucose, UA NEGATIVE NEGATIVE mg/dL   Hgb urine dipstick MODERATE (A) NEGATIVE   Bilirubin Urine NEGATIVE NEGATIVE   Ketones, ur NEGATIVE NEGATIVE mg/dL   Protein, ur NEGATIVE NEGATIVE mg/dL   Nitrite NEGATIVE NEGATIVE   Leukocytes, UA NEGATIVE NEGATIVE   RBC / HPF 6-10 0 - 5 RBC/hpf   WBC, UA 0-5 0 - 5 WBC/hpf   Bacteria, UA RARE (A) NONE SEEN   Squamous Epithelial / LPF 0-5 0 - 5   Mucus PRESENT     Comment: Performed at [facility], [address] [place], [place] [address]  POC occult blood, ED Provider will collect     Status: Abnormal   Collection Time: 04/19/18 11:13 PM  Result Value Ref Range   Fecal Occult Bld POSITIVE (A) NEGATIVE    Ct Abdomen Pelvis W Contrast  Result Date: 04/20/2018 CLINICAL DATA:  Sudden onset of left-sided abdominal pain. Nausea. Diverticulitis suspected. EXAM: CT ABDOMEN AND PELVIS WITH CONTRAST TECHNIQUE: Multidetector CT imaging of the abdomen and pelvis was performed using the standard protocol following bolus administration of intravenous contrast. CONTRAST:  147m OMNIPAQUE IOHEXOL 300 MG/ML  SOLN COMPARISON:  CT  10/08/2017 FINDINGS: Lower chest: The lung bases are clear. Hepatobiliary: No focal liver abnormality is seen. No gallstones, gallbladder wall thickening, or biliary dilatation. Pancreas: No ductal dilatation or inflammation. Spleen: Normal in size without focal  abnormality. Adrenals/Urinary Tract: Normal adrenal glands. No hydronephrosis or perinephric edema. Homogeneous renal enhancement with symmetric excretion on delayed phase imaging. Tiny cysts in the left kidney. Urinary bladder is physiologically distended without wall thickening. Stomach/Bowel: Post gastric bypass. The excluded gastric remnant is unchanged in appearance. The jejunal anastomosis has changed in position located in the right abdomen, previously in the central abdomen. Adjacent small bowel loops are edematous and thick walled with adjacent mesenteric edema, there is mild mesenteric swirling. The cecum is not located in the right upper quadrant. Moderate to large volume of colonic stool. Vascular/Lymphatic: Mild swirling of mesenteric vasculature. No portal venous or mesenteric gas. No adenopathy. Reproductive: Uterus is bulbous with enhancing fibroid posteriorly. Possible tampon in the vagina. No adnexal mass. Other: Mesenteric edema and fluid in the right abdomen at site of jejunal anastomosis. No free air. No evidence of intra-abdominal abscess. Postsurgical change of the anterior abdominal wall post abdominal plasty. Musculoskeletal: There are no acute or suspicious osseous abnormalities. IMPRESSION: 1. Post gastric bypass. Findings suspicious for internal hernia with change in position of the jejunal anastomosis since prior exam and edematous appearing bowel loops, mesenteric edema and fluid. Recommend surgical consultation. 2. Uterine fibroid. These results were called by telephone at the time of interpretation on 04/20/2018 at 12:55 am [place] , who verbally acknowledged these results. Electronically Signed   By: Keith Rake M.D.   On: 04/20/2018 00:55    ROS - all of the below systems have been reviewed with the patient and positives are indicated with bold text General: chills, fever or night sweats Eyes: blurry vision or double vision ENT: epistaxis or sore  throat Allergy/Immunology: itchy/watery eyes or nasal congestion Hematologic/Lymphatic: bleeding problems, blood clots or swollen lymph nodes Endocrine: temperature intolerance or unexpected weight changes Breast: new or changing breast lumps or nipple discharge Resp: cough, shortness of breath, or wheezing CV: chest pain or dyspnea on exertion GI: as per HPI GU: dysuria, trouble voiding, or hematuria MSK: joint pain or joint stiffness Neuro: TIA or stroke symptoms Derm: pruritus and skin lesion changes Psych: anxiety and depression  PE Blood pressure 125/80, pulse 79, temperature 97.9 F (36.6 C), temperature source Oral, resp. rate 18, last menstrual period 04/19/2018, SpO2 100 %. Constitutional: NAD; conversant; no deformities Eyes: Moist conjunctiva; no lid lag; anicteric; PERRL Neck: Trachea midline; no thyromegaly Lungs: Normal respiratory effort; no tactile fremitus CV: RRR; no palpable thrills; no pitting edema GI: Abd soft, mildly ttp along left hemiabdomen; nondistended; no rebound; no guarding. No palpable hepatosplenomegaly MSK: Normal gait; no clubbing/cyanosis Psychiatric: Appropriate affect; alert and oriented x3 Lymphatic: No palpable cervical or axillary lymphadenopathy  Results for orders placed or performed during the hospital encounter of 04/19/18 (from the past 48 hour(s))  Lipase, blood     Status: None   Collection Time: 04/19/18  5:55 PM  Result Value Ref Range   Lipase 38 11 - 51 U/L    Comment: Performed at [facility], [place] [address] [place], [place] [address]  Comprehensive metabolic panel     Status: Abnormal   Collection Time: 04/19/18  5:55 PM  Result Value Ref Range   Sodium 140 135 - 145 mmol/L   Potassium 3.9 3.5 - 5.1 mmol/L   Chloride 114 (  H) 98 - 111 mmol/L   CO2 20 (L) 22 - 32 mmol/L   Glucose, Bld 106 (H) 70 - 99 mg/dL   BUN 16 6 - 20 mg/dL   Creatinine, Ser 0.80 0.44 - 1.00 mg/dL   Calcium 8.5 (L) 8.9 - 10.3 mg/dL   Total  Protein 7.1 6.5 - 8.1 g/dL   Albumin 3.4 (L) 3.5 - 5.0 g/dL   AST 14 (L) 15 - 41 U/L   ALT 6 0 - 44 U/L   Alkaline Phosphatase 110 38 - 126 U/L   Total Bilirubin 0.4 0.3 - 1.2 mg/dL   GFR calc non Af Amer >60 >60 mL/min   GFR calc Af Amer >60 >60 mL/min    Comment: (NOTE) The eGFR has been calculated using the CKD EPI equation. This calculation has not been validated in all clinical situations. eGFR's persistently <60 mL/min signify possible Chronic Kidney Disease.    Anion gap 6 5 - 15    Comment: Performed at [facility] [address], [place], [place] [address]  CBC     Status: Abnormal   Collection Time: 04/19/18  5:55 PM  Result Value Ref Range   WBC 5.0 4.0 - 10.5 K/uL   RBC 3.48 (L) 3.87 - 5.11 MIL/uL   Hemoglobin 8.7 (L) 12.0 - 15.0 g/dL   HCT 29.8 (L) 36.0 - 46.0 %   MCV 85.6 78.0 - 100.0 fL   MCH 25.0 (L) 26.0 - 34.0 pg   MCHC 29.2 (L) 30.0 - 36.0 g/dL   RDW 15.9 (H) 11.5 - 15.5 %   Platelets 349 150 - 400 K/uL    Comment: Performed at [facility], [place] [address], [place], [place] [address]  I-Stat beta hCG blood, ED     Status: None   Collection Time: 04/19/18  5:59 PM  Result Value Ref Range   I-stat hCG, quantitative <5.0 <5 mIU/mL   Comment 3            Comment:   GEST. AGE      CONC.  (mIU/mL)   <=1 WEEK        5 - 50     2 WEEKS       50 - 500     3 WEEKS       100 - 10,000     4 WEEKS     1,000 - 30,000        FEMALE AND NON-PREGNANT FEMALE:     LESS THAN 5 mIU/mL   Urinalysis, Routine w reflex microscopic     Status: Abnormal   Collection Time: 04/19/18  8:47 PM  Result Value Ref Range   Color, Urine YELLOW YELLOW   APPearance CLEAR CLEAR   Specific Gravity, Urine 1.016 1.005 - 1.030   pH 6.0 5.0 - 8.0   Glucose, UA NEGATIVE NEGATIVE mg/dL   Hgb urine dipstick MODERATE (A) NEGATIVE   Bilirubin Urine NEGATIVE NEGATIVE   Ketones, ur NEGATIVE NEGATIVE mg/dL   Protein, ur NEGATIVE NEGATIVE mg/dL   Nitrite NEGATIVE NEGATIVE    Leukocytes, UA NEGATIVE NEGATIVE   RBC / HPF 6-10 0 - 5 RBC/hpf   WBC, UA 0-5 0 - 5 WBC/hpf   Bacteria, UA RARE (A) NONE SEEN   Squamous Epithelial / LPF 0-5 0 - 5   Mucus PRESENT     Comment: Performed at [facility], [address] [place], [place] [address]  POC occult blood, ED  Provider will collect     Status: Abnormal   Collection Time: 04/19/18 11:13 PM  Result Value Ref Range   Fecal Occult Bld POSITIVE (A) NEGATIVE    Ct Abdomen Pelvis W Contrast  Result Date: 04/20/2018 CLINICAL DATA:  Sudden onset of left-sided abdominal pain. Nausea. Diverticulitis suspected. EXAM: CT ABDOMEN AND PELVIS WITH CONTRAST TECHNIQUE: Multidetector CT imaging of the abdomen and pelvis was performed using the standard protocol following bolus administration of intravenous contrast. CONTRAST:  161m OMNIPAQUE IOHEXOL 300 MG/ML  SOLN COMPARISON:  CT 10/08/2017 FINDINGS: Lower chest: The lung bases are clear. Hepatobiliary: No focal liver abnormality is seen. No gallstones, gallbladder wall thickening, or biliary dilatation. Pancreas: No ductal dilatation or inflammation. Spleen: Normal in size without focal abnormality. Adrenals/Urinary Tract: Normal adrenal glands. No hydronephrosis or perinephric edema. Homogeneous renal enhancement with symmetric excretion on delayed phase imaging. Tiny cysts in the left kidney. Urinary bladder is physiologically distended without wall thickening. Stomach/Bowel: Post gastric bypass. The excluded gastric remnant is unchanged in appearance. The jejunal anastomosis has changed in position located in the right abdomen, previously in the central abdomen. Adjacent small bowel loops are edematous and thick walled with adjacent mesenteric edema, there is mild mesenteric swirling. The cecum is not located in the right upper quadrant. Moderate to large volume of colonic stool. Vascular/Lymphatic: Mild swirling of mesenteric vasculature. No portal venous or mesenteric gas. No  adenopathy. Reproductive: Uterus is bulbous with enhancing fibroid posteriorly. Possible tampon in the vagina. No adnexal mass. Other: Mesenteric edema and fluid in the right abdomen at site of jejunal anastomosis. No free air. No evidence of intra-abdominal abscess. Postsurgical change of the anterior abdominal wall post abdominal plasty. Musculoskeletal: There are no acute or suspicious osseous abnormalities. IMPRESSION: 1. Post gastric bypass. Findings suspicious for internal hernia with change in position of the jejunal anastomosis since prior exam and edematous appearing bowel loops, mesenteric edema and fluid. Recommend surgical consultation. 2. Uterine fibroid. These results were called by telephone at the time of interpretation on 04/20/2018 at 12:55 am [place], who verbally acknowledged these results. Electronically Signed   By: MKeith RakeM.D.   On: 04/20/2018 00:55   A/P: GJACQUELIN KRAJEWSKIis an 47y.o. female s/p lap RYGB 2015 - presents with abdominal discomfort along left abdomen and findings of potential internal hernia on CT  -AF VS normal; WBC 5.0; abdominal exam shows mild tenderness along left abdomen; findings on her CT noted along the right abdomen as well -NPO, MIVF -I discussed the case and imaging findings with one of my partners, Dr. TMarlou Starksas well -I discussed potential options moving forward with the patient and possibility of an underlying internal hernia and implications; if her symptoms persist today, will likely need exploration for further evaluation and possible reduction if internal hernia identified. We discussed NG tube but she has requested to hold off unless intractable n/v -She and her husbands questions were answered to their satisfaction and they elected to proceed with stated plan  CSharon Mt WDema Severin M.D. [facility]

## 2018-04-21 ENCOUNTER — Encounter (HOSPITAL_COMMUNITY): Payer: Self-pay | Admitting: General Practice

## 2018-04-21 ENCOUNTER — Other Ambulatory Visit: Payer: Self-pay

## 2018-04-21 LAB — BASIC METABOLIC PANEL
ANION GAP: 5 (ref 5–15)
BUN: 11 mg/dL (ref 6–20)
CALCIUM: 8.3 mg/dL — AB (ref 8.9–10.3)
CO2: 20 mmol/L — ABNORMAL LOW (ref 22–32)
Chloride: 114 mmol/L — ABNORMAL HIGH (ref 98–111)
Creatinine, Ser: 0.68 mg/dL (ref 0.44–1.00)
GFR calc Af Amer: >60 mL/min (ref >60)
GLUCOSE: 95 mg/dL (ref 70–99)
Potassium: 3.8 mmol/L (ref 3.5–5.1)
Sodium: 139 mmol/L (ref 135–145)

## 2018-04-21 LAB — CBC
HCT: 25.6 % — ABNORMAL LOW (ref 36.0–46.0)
Hemoglobin: 7.7 g/dL — ABNORMAL LOW (ref 12.0–15.0)
MCH: 25.2 pg — ABNORMAL LOW (ref 26.0–34.0)
MCHC: 30.1 g/dL (ref 30.0–36.0)
MCV: 83.7 fL (ref 78.0–100.0)
PLATELETS: 315 10*3/uL (ref 150–400)
RBC: 3.06 MIL/uL — ABNORMAL LOW (ref 3.87–5.11)
RDW: 15.9 % — AB (ref 11.5–15.5)
WBC: 6.1 10*3/uL (ref 4.0–10.5)

## 2018-04-21 LAB — HIV ANTIBODY (ROUTINE TESTING W REFLEX): HIV SCREEN 4TH GENERATION: NONREACTIVE

## 2018-04-21 MED ORDER — ACETAMINOPHEN 325 MG PO TABS: 650.0000 mg | ORAL_TABLET | Freq: Four times a day (QID) | ORAL | Status: DC | PRN

## 2018-04-21 MED ORDER — TRAMADOL HCL 50 MG PO TABS: 50.0000 mg | ORAL_TABLET | Freq: Four times a day (QID) | ORAL | 0 refills | Status: DC | PRN

## 2018-04-21 MED ORDER — PREMIER PROTEIN SHAKE: 2.0000 [oz_av] | Freq: Three times a day (TID) | ORAL | 0 refills | Status: AC

## 2018-04-21 NOTE — Anesthesia Postprocedure Evaluation (Signed)
Anesthesia Post Note  Patient: Anna [name]  Procedure(s) Performed: DIAGNOSTIC LAPAROSCOPY (N/A Abdomen) LAPAROSCOPIC LYSIS OF ADHESION (N/A Abdomen)     Patient location during evaluation: PACU Anesthesia Type: General Level of consciousness: awake and alert Pain management: pain level controlled Vital Signs Assessment: post-procedure vital signs reviewed and stable Respiratory status: spontaneous breathing, nonlabored ventilation and respiratory function stable Cardiovascular status: blood pressure returned to baseline and stable Postop Assessment: no apparent nausea or vomiting Anesthetic complications: no    Last Vitals:  Vitals:   04/21/18 0810 04/21/18 0813  BP: 93/67 102/69  Pulse: (!) 57 (!) 58  Resp:    Temp: 36.9 C   SpO2: 100%     Last Pain:  Vitals:   04/21/18 0810  TempSrc: Oral  PainSc:                  Beryle Lathehomas E Brock

## 2018-04-21 NOTE — Plan of Care (Signed)
  Problem: Education: Goal: Knowledge of General Education information will improve Description Including pain rating scale, medication(s)/side effects and non-pharmacologic comfort measures 04/21/2018 1014 by Darreld Mcleanox, Don Giarrusso, RN Outcome: Adequate for Discharge 04/21/2018 0938 by Darreld Mcleanox, Enyla Lisbon, RN Outcome: Progressing   Problem: Health Behavior/Discharge Planning: Goal: Ability to manage health-related needs will improve 04/21/2018 1014 by Darreld Mcleanox, Mrytle Bento, RN Outcome: Adequate for Discharge 04/21/2018 0938 by Darreld Mcleanox, Lynelle Weiler, RN Outcome: Progressing   Problem: Clinical Measurements: Goal: Ability to maintain clinical measurements within normal limits will improve 04/21/2018 1014 by Darreld Mcleanox, Kurtiss Wence, RN Outcome: Adequate for Discharge 04/21/2018 0938 by Darreld Mcleanox, Kuron Docken, RN Outcome: Progressing Goal: Will remain free from infection 04/21/2018 1014 by Darreld Mcleanox, Meklit Cotta, RN Outcome: Adequate for Discharge 04/21/2018 0938 by Darreld Mcleanox, Zaden Sako, RN Outcome: Progressing Goal: Diagnostic test results will improve 04/21/2018 1014 by Darreld Mcleanox, Alphia Behanna, RN Outcome: Adequate for Discharge 04/21/2018 0938 by Darreld Mcleanox, Winnie Barsky, RN Outcome: Progressing Goal: Respiratory complications will improve 04/21/2018 1014 by Darreld Mcleanox, Sawyer Kahan, RN Outcome: Adequate for Discharge 04/21/2018 0938 by Darreld Mcleanox, Mechele Kittleson, RN Outcome: Progressing Goal: Cardiovascular complication will be avoided 04/21/2018 1014 by Darreld Mcleanox, Tailer Volkert, RN Outcome: Adequate for Discharge 04/21/2018 0938 by Darreld Mcleanox, Mario Coronado, RN Outcome: Progressing   Problem: Activity: Goal: Risk for activity intolerance will decrease 04/21/2018 1014 by Darreld Mcleanox, Amayah Staheli, RN Outcome: Adequate for Discharge 04/21/2018 0938 by Darreld Mcleanox, Rielle Schlauch, RN Outcome: Progressing   Problem: Nutrition: Goal: Adequate nutrition will be maintained 04/21/2018 1014 by Darreld Mcleanox, Nala Kachel, RN Outcome: Adequate for Discharge 04/21/2018 0938 by Darreld Mcleanox, Raysean Graumann, RN Outcome: Progressing   Problem: Coping: Goal: Level of anxiety will decrease 04/21/2018 1014 by Darreld Mcleanox, Skyelynn Rambeau, RN Outcome: Adequate for  Discharge 04/21/2018 0938 by Darreld Mcleanox, Armina Galloway, RN Outcome: Progressing   Problem: Elimination: Goal: Will not experience complications related to bowel motility 04/21/2018 1014 by Darreld Mcleanox, Lota Leamer, RN Outcome: Adequate for Discharge 04/21/2018 0938 by Darreld Mcleanox, Hezzie Karim, RN Outcome: Progressing Goal: Will not experience complications related to urinary retention 04/21/2018 1014 by Darreld Mcleanox, Seanmichael Salmons, RN Outcome: Adequate for Discharge 04/21/2018 0938 by Darreld Mcleanox, Soumya Colson, RN Outcome: Progressing   Problem: Safety: Goal: Ability to remain free from injury will improve 04/21/2018 1014 by Darreld Mcleanox, Oluwanifemi Susman, RN Outcome: Adequate for Discharge 04/21/2018 0938 by Darreld Mcleanox, Jezabelle Chisolm, RN Outcome: Progressing   Problem: Skin Integrity: Goal: Risk for impaired skin integrity will decrease 04/21/2018 1014 by Darreld Mcleanox, Dennys Traughber, RN Outcome: Adequate for Discharge 04/21/2018 0938 by Darreld Mcleanox, Edilia Ghuman, RN Outcome: Progressing

## 2018-04-21 NOTE — Progress Notes (Signed)
1 Day Post-Op    CC:  Abdominal pain  Subjective: Patient is doing well on the bariatric full liquids.  Pain is well controlled with plain Tylenol and Ultram.  Sites all look good.  Regular diet prior to admission but plans to stay on the full bariatrics.  She would like to follow-up with her primary physician Dr.McNatt.   Objective: Vital signs in last 24 hours: Temp:  [97.5 F (36.4 C)-98.8 F (37.1 C)] 98.4 F (36.9 C) (09/13 0810) Pulse Rate:  [54-77] 58 (09/13 0813) Resp:  [9-16] 16 (09/13 0339) BP: (93-133)/(61-80) 102/69 (09/13 0813) SpO2:  [96 %-100 %] 100 % (09/13 0810) Weight:  [74.8 kg] 74.8 kg (09/12 1020) Last BM Date: 04/19/18 840 PO 1000 IV Urine x 2  No BM Afebrile, VSS Labs OK, H/H down some   Intake/Output from previous day: 09/12 0701 - 09/13 0700 In: 1840 [P.O.:840; I.V.:1000] Out: -  Intake/Output this shift: No intake/output data recorded.  General appearance: alert, cooperative and no distress Resp: clear to auscultation bilaterally GI: Port sites all look good, tolerating diet well.  No BM so far.  Lab Results:  Recent Labs    04/19/18 1755 04/21/18 0347  WBC 5.0 6.1  HGB 8.7* 7.7*  HCT 29.8* 25.6*  PLT 349 315    BMET Recent Labs    04/19/18 1755 04/21/18 0347  NA 140 139  K 3.9 3.8  CL 114* 114*  CO2 20* 20*  GLUCOSE 106* 95  BUN 16 11  CREATININE 0.80 0.68  CALCIUM 8.5* 8.3*   PT/INR No results for input(s): LABPROT, INR in the last 72 hours.  Recent Labs  Lab 04/19/18 1755  AST 14*  ALT 6  ALKPHOS 110  BILITOT 0.4  PROT 7.1  ALBUMIN 3.4*     Lipase     Component Value Date/Time   LIPASE 38 04/19/2018 1755     Prior to Admission medications   Medication Sig Start Date End Date Taking? Authorizing Provider  amLODipine (NORVASC) 10 MG tablet  05/04/12  Yes [provider]  calcium citrate-vitamin D (CITRACAL+D) 315-200 MG-UNIT per tablet Take 1 tablet by mouth 2 (two) times daily.   Yes [provider]  cyanocobalamin (,VITAMIN B-12,) 1000 MCG/ML injection Inject 1 mL into the muscle every 30 (thirty) days. 09/05/17  Yes [provider]  MULTIPLE VITAMIN PO Take by mouth.   Yes [provider]  pantoprazole (PROTONIX) 40 MG tablet Take 40 mg by mouth daily. 04/19/18  Yes [provider]  polyethylene glycol powder (MIRALAX) powder Take 17 g by mouth daily. Patient taking differently: Take 17 g by mouth as needed for mild constipation.  11/02/17  Yes Palumbo, April, MD  potassium chloride (KLOR-CON) 20 MEQ packet Take 20 mEq by mouth 2 (two) times daily.    Yes [provider]  promethazine (PHENERGAN) 25 MG tablet Take 1 tablet (25 mg total) by mouth every 6 (six) hours as needed for nausea or vomiting. 10/08/17  Yes Ward, Layla MawKristen N, DO  topiramate (TOPAMAX) 200 MG tablet Take 100 mg by mouth 2 (two) times daily. For migraines   Yes [provider]  zolpidem (AMBIEN CR) 12.5 MG CR tablet Take 12.5 mg by mouth at bedtime as needed for sleep.   Yes [provider]  dicyclomine (BENTYL) 20 MG tablet Take 1 tablet (20 mg total) by mouth 2 (two) times daily. Patient not taking: Reported on 04/20/2018 11/02/17   Nicanor AlconPalumbo, April, MD  esomeprazole (NEXIUM) 40 MG capsule Take 40 mg by mouth at bedtime.      [provider]  Fe Cbn-Fe Gluc-FA-B12-C-DSS (FERRALET 90) 90-1 MG TABS  04/29/12   [provider]  ferrous sulfate 325 (65 FE) MG tablet Take 325 mg by mouth 2 (two) times daily with a meal.     [provider]  HYDROcodone-acetaminophen (NORCO) 10-325 MG tablet Take 1 tablet by mouth every 4 (four) hours as needed.    [provider]  metoCLOPramide (REGLAN) 10 MG tablet Take 1 tablet (10 mg total) by mouth 2 (two) times daily as needed (headache). Patient not taking: Reported on 04/20/2018 01/02/16   Tomasita Crumble, MD  omeprazole (PRILOSEC) 20 MG capsule Take 20 mg by mouth daily.    [provider]  sucralfate (CARAFATE) 1 GM/10ML suspension Take 10 mLs (1 g total) by mouth 4 (four) times daily. Patient not taking: Reported on 04/20/2018 11/22/12   Palumbo, April, MD  Thiamine HCl (VITAMIN B-1) 250 MG tablet Take 250 mg by mouth daily.    [provider]  traMADol (ULTRAM) 50 MG tablet Take 1 tablet (50 mg total) by mouth every 8 (eight) hours as needed for pain. Patient not taking: Reported on 04/20/2018 02/22/13   Lenda Kelp, MD  zolpidem (AMBIEN) 10 MG tablet Take 10 mg by mouth at bedtime. sleep    [provider]    Medications: . ferrous sulfate  325 mg Oral BID WC  . heparin  5,000 Units Subcutaneous Q8H  . pantoprazole  40 mg Oral Daily  . polyethylene glycol powder  17 g Oral Daily  . protein supplement shake  2 oz Oral Q4H  . sucralfate  1 g Oral QID  . vitamin B-1  250 mg Oral Daily  . topiramate  200 mg Oral Daily  . zolpidem  5 mg Oral Once   . 0.9 % NaCl with KCl 20 mEq / L 125 mL/hr at 04/21/18 0412  . lactated ringers 100 mL/hr at 04/20/18 1900   Anti-infectives (From admission, onward)   Start     Dose/Rate Route Frequency Ordered Stop   04/20/18 1115  cefoTEtan (CEFOTAN) 2 g in sodium chloride 0.9 % 100 mL IVPB     2 g 200 mL/hr over 30 Minutes Intravenous To Surgery 04/20/18 1111 04/20/18 1104      Assessment/Plan  Hx of chronic abdominal pain GERD Hx of Anemia Hx hypertension Migraines  Epigastric pain, 2 bands causing internal hernia S/p laparoscopic RYGB in 2015 at [facility] by Dr. Barnetta Chapel  Diagnostic laparoscopy, lysis of adhesions  FEN:IV fluids/Bariatric full ID;  Preop Cefotetan DVT: Heparin Follow up: Dr. Barnetta Chapel @ [facility]         LOS: 1 day    Sherrie George 04/21/2018 [phone]

## 2018-04-21 NOTE — Discharge Instructions (Signed)
LAPAROSCOPIC SURGERY: POST OP INSTRUCTIONS  ######################################################################  EAT Gradually transition to a high fiber diet with a fiber supplement over the next few weeks after discharge.  Start with a pureed / full liquid diet (see below)  WALK Walk an hour a day.  Control your pain to do that.    CONTROL PAIN Control pain so that you can walk, sleep, tolerate sneezing/coughing, go up/down stairs.  HAVE A BOWEL MOVEMENT DAILY Keep your bowels regular to avoid problems.  OK to try a laxative to override constipation.  OK to use an antidairrheal to slow down diarrhea.  Call if not better after 2 tries  CALL IF YOU HAVE PROBLEMS/CONCERNS Call if you are still struggling despite following these instructions. Call if you have concerns not answered by these instructions  ######################################################################    1. DIET: Follow a light bland diet the first 24 hours after arrival home, such as soup, liquids, crackers, etc.  Be sure to include lots of fluids daily.  Avoid fast food or heavy meals as your are more likely to get nauseated.  Eat a low fat the next few days after surgery.   2. Take your usually prescribed home medications unless otherwise directed. 3. PAIN CONTROL: a. Pain is best controlled by a usual combination of three different methods TOGETHER: i. Ice/Heat ii. Over the counter pain medication iii. Prescription pain medication b. Most patients will experience some swelling and bruising around the incisions.  Ice packs or heating pads (30-60 minutes up to 6 times a day) will help. Use ice for the first few days to help decrease swelling and bruising, then switch to heat to help relax tight/sore spots and speed recovery.  Some people prefer to use ice alone, heat alone, alternating between ice & heat.  Experiment to what works for you.  Swelling and bruising can take several weeks to resolve.   c. It is  helpful to take an over-the-counter pain medication regularly for the first few weeks.  Choose one of the following that works best for you: i. Naproxen (Aleve, etc)  Two 251m tabs twice a day ii. Ibuprofen (Advil, etc) Three 2053mtabs four times a day (every meal & bedtime) iii. Acetaminophen (Tylenol, etc) 500-65044mour times a day (every meal & bedtime) d. A  prescription for pain medication (such as oxycodone, hydrocodone, etc) should be given to you upon discharge.  Take your pain medication as prescribed.  i. If you are having problems/concerns with the prescription medicine (does not control pain, nausea, vomiting, rash, itching, etc), please call us [phone] see if we need to switch you to a different pain medicine that will work better for you and/or control your side effect better. ii. If you need a refill on your pain medication, please contact your pharmacy.  They will contact our office to request authorization. Prescriptions will not be filled after 5 pm or on week-ends. 4. Avoid getting constipated.  Between the surgery and the pain medications, it is common to experience some constipation.  Increasing fluid intake and taking a fiber supplement (such as Metamucil, Citrucel, FiberCon, MiraLax, etc) 1-2 times a day regularly will usually help prevent this problem from occurring.  A mild laxative (prune juice, Milk of Magnesia, MiraLax, etc) should be taken according to package directions if there are no bowel movements after 48 hours.   5. Watch out for diarrhea.  If you have many loose bowel movements, simplify your diet to bland foods & liquids for  a few days.  Stop any stool softeners and decrease your fiber supplement.  Switching to mild anti-diarrheal medications (Kayopectate, Pepto Bismol) can help.  If this worsens or does not improve, please call us. 6. Wash / shower every day.  You may shower over the dressings as they are waterproof.  Continue to shower over incision(s)  after the dressing is off. 7. Remove your waterproof bandages 5 days after surgery.  You may leave the incision open to air.  You may replace a dressing/Band-Aid to cover the incision for comfort if you wish.  8. ACTIVITIES as tolerated:   a. You may resume regular (light) daily activities beginning the next day--such as daily self-care, walking, climbing stairs--gradually increasing activities as tolerated.  If you can walk 30 minutes without difficulty, it is safe to try more intense activity such as jogging, treadmill, bicycling, low-impact aerobics, swimming, etc. b. Save the most intensive and strenuous activity for last such as sit-ups, heavy lifting, contact sports, etc  Refrain from any heavy lifting or straining until you are off narcotics for pain control.   c. DO NOT PUSH THROUGH PAIN.  Let pain be your guide: If it hurts to do something, don't do it.  Pain is your body warning you to avoid that activity for another week until the pain goes down. d. You may drive when you are no longer taking prescription pain medication, you can comfortably wear a seatbelt, and you can safely maneuver your car and apply brakes. e. Bonita QuinYou may have sexual intercourse when it is comfortable.  9. FOLLOW UP in our office a. Please call [facility] at [phone] to set up an appointment to see your surgeon in the office for a follow-up appointment approximately 2-3 weeks after your surgery. b. Make sure that you call for this appointment the day you arrive home to insure a convenient appointment time. 10. IF YOU HAVE DISABILITY OR FAMILY LEAVE FORMS, BRING THEM TO THE OFFICE FOR PROCESSING.  DO NOT GIVE THEM TO YOUR DOCTOR.   WHEN TO CALL US [phone]: 1. Poor pain control 2. Reactions / problems with new medications (rash/itching, nausea, etc)  3. Fever over 101.5 F (38.5 C) 4. Inability to urinate 5. Nausea and/or vomiting 6. Worsening swelling or bruising 7. Continued bleeding from incision. 8. Increased  pain, redness, or drainage from the incision   The clinic staff is available to answer your questions during regular business hours (8:30am-5pm).  Please dont hesitate to call and ask to speak to one of our nurses for clinical concerns.   If you have a medical emergency, go to the nearest emergency room or call 911.  A surgeon from [facility] is always on call at the [facility]   [facility] [place] [address], [address], [place], [place]  [id number] ? MAIN: [phone] ? TOLL FREE: [phone] ?  FAX [phone] [email or web address]  No lifting over 15 pounds for 3 weeks.

## 2018-04-21 NOTE — Plan of Care (Signed)

## 2018-04-21 NOTE — Progress Notes (Signed)
Physician Discharge Summary  Patient ID: Anna [name] MRN: [id number] DOB/AGE: 1970-09-10 47 y.o.  Admit date: 04/19/2018 Discharge date: 04/21/2018  Admission Diagnoses:  Abdominal cramps possible internal hernia S/p laparoscopic RYGB in 2015 at [facility] by Dr. Barnetta Chapel  Hx of chronic abdominal pain GERD Hx of Anemia Hx hypertension Migraines  Discharge Diagnoses:  Epigastric pain, 2 bands causing internal hernia S/p laparoscopic RYGB in 2015 at [facility] by Dr. Barnetta Chapel  Hx of chronic abdominal pain GERD Hx of Anemia Hx hypertension Migraines  Active Problems:   Abdominal pain   PROCEDURES: Diagnostic laparoscopy, lysis of adhesions, 04/20/18, Dr. Franky Macho [facility] Course:  [name] is an 47 y.o. female with hx of chronic abdominal pain, GERD, anemia whom underwent laparoscopic RYGB in 2015 at [facility] by Dr. Barnetta Chapel - presents to the ED overnight with onset of abdominal discomfort that is described as crampy which began in the afternoon on 04/19/18. 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 She thought it was bad gas cramps but it didn't go away quickly so she came in for further evaluation. She describes these pains as being in her left hemiabdomen. She has had this kind of pain off and on over the past 1 month. Nothing seems to make it better but with time they seem to improve. She had an episode of n/v today and eariler this month as well. She has been passing flatus here in the ER and having BMs today. She notes a history of chronic constipation as well and takes daily miralax for this. She denies pain in her right abdomen. She was admitted and Dr. Feliciana Rossetti reviewed films and took her to the OR for the above procedure.  She has done well, she was placed on Bariatric full liquids and took over 800 ml overnight.  She feels good sites all look fine and she would like to go home.  We gave her the option of following up  with Dr. Sheliah Hatch or Dr. Barnetta Chapel.  She would like to follow up with Dr. Barnetta Chapel in [facility]   We recommended she stay on Bariatric fulls till she see's Dr. Barnetta Chapel in [place].  I have placed the op note and the CT impression below.  CBC Latest Ref Rng & Units 04/21/2018 04/19/2018 11/01/2017  WBC 4.0 - 10.5 K/uL 6.1 5.0 5.4  Hemoglobin 12.0 - 15.0 g/dL 7.7(L) 8.7(L) 10.7(L)  Hematocrit 36.0 - 46.0 % 25.6(L) 29.8(L) 31.9(L)  Platelets 150 - 400 K/uL 315 349 275   CMP Latest Ref Rng & Units 04/21/2018 04/19/2018 11/01/2017  Glucose 70 - 99 mg/dL 95 782(N) 92  BUN 6 - 20 mg/dL 11 16 19   Creatinine 0.44 - 1.00 mg/dL 5.62 1.30 8.65  Sodium 135 - 145 mmol/L 139 140 138  Potassium 3.5 - 5.1 mmol/L 3.8 3.9 3.8  Chloride 98 - 111 mmol/L 114(H) 114(H) 112(H)  CO2 22 - 32 mmol/L 20(L) 20(L) 19(L)  Calcium 8.9 - 10.3 mg/dL 8.3(L) 8.5(L) 8.6(L)  Total Protein 6.5 - 8.1 g/dL - 7.1 6.7  Total Bilirubin 0.3 - 1.2 mg/dL - 0.4 7.8(I)  Alkaline Phos 38 - 126 U/L - 110 83  AST 15 - 41 U/L - 14(L) 13(L)  ALT 0 - 44 U/L - 6 5(L)   CTabd/Pelvis 04/20/18:  1. Post gastric bypass. Findings suspicious for internal hernia with change in position of the jejunal anastomosis since prior exam and edematous appearing bowel loops, mesenteric edema and  fluid. Recommend surgical consultation. 2. Uterine fibroid.   Preoperative diagnosis: epigastric pain, internal hernia  Postoperative diagnosis: epigastric pain, 2 bands causing internal hernia  Procedure: diagnostic laparoscopy, lysis of adhesions  Surgeon: Feliciana RossettiLuke Kinsinger, M.D.  Asst: Carman Chingoug Blackman, M.D.  Anesthesia: gen  Indications for procedure: Tracie HarrierGwendolyn D Shackett is a 47 y.o. year old female with symptoms of abdominal pain and vomiting. On workup her previous anastomosis were in odd locations and therefore decision was made to perform laparoscopy to examine for internal hernia.  Description of procedure: The patient was brought into the operative  suite. Anesthesia was administered with General endotracheal anesthesia. WHO checklist was applied. The patient was then placed in supine position. The area was prepped and draped in the usual sterile fashion.  Next a small transverse left subcostal incision was made and a 5 mm trocar was used to gain access to the peritoneal cavity with optical entry technique.  Pneumoperitoneum was applied with a high flow and low pressure.  Laparoscope was reinserted to confirm placement.  On initial evaluation of the abdominal cavity there was one single band of omentum up to the mid abdomen.  2 additional 5 mm trochars were placed one in the left mid abdomen and one in the left lower abdomen.  Both trochars were placed under direct optic local visualization.  Next the single band of omentum up to the abdominal wall was lysed with cautery.  We began running the intestine by identifying the transverse colon and cecum starting at the terminal ileum we ran the small intestine retrograde fashion about halfway into the jejunal running there was a band overlying the intestine connecting from a jejunal mesentery to the right retroperitoneal area.  This was lysed and the band of omentum was removed.  After this we continued running the intestine identifying the jejunal jejunal anastomosis which showed to have some lymph edema in the mesentery.  There are no signs of perforation or ischemia.  We continued running the intestine of the Roux limb and identified the GJ which appeared in normal condition.  We also ran the biliary limb which was in good location.  The entirety of the JJ was examined there appeared to be no internal hernia defect.  And the entire small intestine one additional time to ensure there was no additional twisting which there was not.  Therefore the pneumoperitoneum was removed and the skin was closed with 4-0 Monocryl in subcuticular fashion.  10 milliliters of Marcaine was put into the subcutaneous tissue  around the incisions.  Dermabond was put in place with dressing.  Findings: Small band of omental tongue causing internal hernia of small intestine.  Specimen: None  Implant: None  Blood loss: Minimal  Local anesthesia: 10 ml marcaine   Complications: none  Feliciana RossettiLuke Kinsinger, M.D. General, Bariatric, & Minimally Invasive Surgery [facility]   Condition on discharge: Improved  Disposition: Discharge disposition: 01-Home or Self Care        Allergies as of 04/21/2018      Reactions   Nsaids Other (See Comments)   Unable to take due to gastric bypass   Shellfish Allergy    Positive allergy test   Tamiflu [oseltamivir Phosphate] Hives      Medication List    STOP taking these medications   esomeprazole 40 MG capsule Commonly known as:  NEXIUM   HYDROcodone-acetaminophen 10-325 MG tablet Commonly known as:  NORCO   omeprazole 20 MG capsule Commonly known as:  PRILOSEC  promethazine 25 MG tablet Commonly known as:  PHENERGAN     TAKE these medications   acetaminophen 325 MG tablet Commonly known as:  TYLENOL Take 2 tablets (650 mg total) by mouth every 6 (six) hours as needed (pain).   amLODipine 10 MG tablet Commonly known as:  NORVASC   calcium citrate-vitamin D 315-200 MG-UNIT tablet Commonly known as:  CITRACAL+D Take 1 tablet by mouth 2 (two) times daily.   cyanocobalamin 1000 MCG/ML injection Commonly known as:  (VITAMIN B-12) Inject 1 mL into the muscle every 30 (thirty) days.   dicyclomine 20 MG tablet Commonly known as:  BENTYL Take 1 tablet (20 mg total) by mouth 2 (two) times daily.   FERRALET 90 90-1 MG Tabs   ferrous sulfate 325 (65 FE) MG tablet Take 325 mg by mouth 2 (two) times daily with a meal.   metoCLOPramide 10 MG tablet Commonly known as:  REGLAN Take 1 tablet (10 mg total) by mouth 2 (two) times daily as needed (headache).   MULTIPLE VITAMIN PO Take by mouth.   pantoprazole 40 MG  tablet Commonly known as:  PROTONIX Take 40 mg by mouth daily.   polyethylene glycol powder powder Commonly known as:  GLYCOLAX/MIRALAX Take 17 g by mouth daily. What changed:    when to take this  reasons to take this   potassium chloride 20 MEQ packet Commonly known as:  KLOR-CON Take 20 mEq by mouth 2 (two) times daily.   protein supplement shake Liqd Commonly known as:  PREMIER PROTEIN Take 59.1 mLs (2 oz total) by mouth 4 (four) times daily -  with meals and at bedtime.   sucralfate 1 GM/10ML suspension Commonly known as:  CARAFATE Take 10 mLs (1 g total) by mouth 4 (four) times daily.   topiramate 200 MG tablet Commonly known as:  TOPAMAX Take 100 mg by mouth 2 (two) times daily. For migraines   traMADol 50 MG tablet Commonly known as:  ULTRAM Take 1 tablet (50 mg total) by mouth every 6 (six) hours as needed (pain not controlled with tylenol). What changed:    when to take this  reasons to take this   vitamin B-1 250 MG tablet Take 250 mg by mouth daily.   zolpidem 12.5 MG CR tablet Commonly known as:  AMBIEN CR Take 12.5 mg by mouth at bedtime as needed for sleep. What changed:  Another medication with the same name was removed. Continue taking this medication, and follow the directions you see here.      Follow-up Information    McNatt, Viviann Spare, MD Follow up.   Specialty:  Surgery Why:  call for follow up appointment and let him know about your surgery.  Follow your Bariatric diet till you see him.   Contact information: [address] [place] [address] [phone]        Kinsinger, De Blanch, MD Follow up.   Specialty:  General Surgery Why:  Dr. Sheliah Hatch did your surgery, you can call if you have any issues.   Contact information: [address] [place] [address] [phone]        Irving Copas H, PA-C Follow up.   Specialty:  Physician Assistant Why:  call for medical issues and your  other mediciation needs Contact information: [address] [place] [address] [phone]           Signed: Sherrie George 04/21/2018, 10:16 AM

## 2018-04-23 NOTE — Discharge Summary (Signed)
Anna [name] MRN: [id number] DOB/AGE: 01/17/1971 47 y.o.  Admit date: 04/19/2018 Discharge date: 04/21/2018  Admission Diagnoses:  Abdominal cramps possible internal hernia S/plaparoscopic RYGB in 2015 at [facility] by Dr. Barnetta Chapel Hx of chronic abdominal pain GERD Hx of Anemia Hx hypertension Migraines  Discharge Diagnoses:  Epigastric pain, 2 bands causing internal hernia S/plaparoscopic RYGB in 2015 at [facility] by Dr. Barnetta Chapel Hx of chronic abdominal pain GERD Hx of Anemia Hx hypertension Migraines  Active Problems:   Abdominal pain   PROCEDURES: Diagnostic laparoscopy, lysis of adhesions, 04/20/18, Dr. Franky Macho [facility] Course:  Anna Slimmer Singletonis an 47 y.o.femalewith hx of chronic abdominal pain, GERD, anemiawhom underwent laparoscopic RYGB in 2015 at [facility] by Dr. Barnetta Chapel - presents to the ED overnight with onset of abdominal discomfort that is described as crampy which began in the afternoon on 04/19/18. 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 She thought it was bad gas cramps but it didn't go away quickly so she came in for further evaluation. She describes these pains as being in her left hemiabdomen. She has had this kind of pain off and on over the past 1 month. Nothing seems to make it better but with time they seem to improve. She had an episode of n/v today and eariler this month as well. She has been passing flatus here in the ER and having BMs today. She notes a history of chronic constipation as well and takes daily miralax for this. She denies pain in her right abdomen. She was admitted and Dr. Feliciana Rossetti reviewed films and took her to the OR for the above procedure.  She has done well, she was placed on Bariatric full liquids and took over 800 ml overnight.  She feels good sites all look fine and she would like to go home.  We gave her the option of following up with Dr. Sheliah Hatch or Dr. Barnetta Chapel.   She would like to follow up with Dr. Barnetta Chapel in [facility].   We recommended she stay on Bariatric fulls till she see's Dr. Barnetta Chapel in [place].  I have placed the op note and the CT impression below.  CBC Latest Ref Rng & Units 04/21/2018 04/19/2018 11/01/2017  WBC 4.0 - 10.5 K/uL 6.1 5.0 5.4  Hemoglobin 12.0 - 15.0 g/dL 7.7(L) 8.7(L) 10.7(L)  Hematocrit 36.0 - 46.0 % 25.6(L) 29.8(L) 31.9(L)  Platelets 150 - 400 K/uL 315 349 275   CMP Latest Ref Rng & Units 04/21/2018 04/19/2018 11/01/2017  Glucose 70 - 99 mg/dL 95 409(W) 92  BUN 6 - 20 mg/dL 11 16 19   Creatinine 0.44 - 1.00 mg/dL 1.19 1.47 8.29  Sodium 135 - 145 mmol/L 139 140 138  Potassium 3.5 - 5.1 mmol/L 3.8 3.9 3.8  Chloride 98 - 111 mmol/L 114(H) 114(H) 112(H)  CO2 22 - 32 mmol/L 20(L) 20(L) 19(L)  Calcium 8.9 - 10.3 mg/dL 8.3(L) 8.5(L) 8.6(L)  Total Protein 6.5 - 8.1 g/dL - 7.1 6.7  Total Bilirubin 0.3 - 1.2 mg/dL - 0.4 5.6(O)  Alkaline Phos 38 - 126 U/L - 110 83  AST 15 - 41 U/L - 14(L) 13(L)  ALT 0 - 44 U/L - 6 5(L)   CTabd/Pelvis 04/20/18:  1. Post gastric bypass. Findings suspicious for internal hernia with change in position of the jejunal anastomosis since prior exam and edematous appearing bowel loops, mesenteric edema and fluid. Recommend surgical consultation. 2. Uterine fibroid.   Preoperative diagnosis:epigastric pain, internal hernia  Postoperative diagnosis:epigastric pain, 2 bands causing internal hernia  Procedure:diagnostic laparoscopy, lysis of adhesions  Surgeon: Feliciana Rossetti, M.D.  Asst:Doug Magnus Ivan, M.D.  Anesthesia:gen  Indications for procedure:Anna D Singletonis a 47 y.o.year old femalewith symptoms of abdominal pain and vomiting. On workup her previous anastomosis were in odd locations and therefore decision was made to perform laparoscopy to examine for internal hernia.  Description of procedure: The patient was brought into the operative suite. Anesthesia was administered  withGeneral endotracheal anesthesia. WHO checklist was applied. The patient was then placed insupineposition. The area was prepped and draped in the usual sterile fashion.  Next a small transverse left subcostal incision was made and a 5 mm trocar was used to gain access to the peritoneal cavity with optical entry technique. Pneumoperitoneum was applied with a high flow and low pressure. Laparoscope was reinserted to confirm placement. On initial evaluation of the abdominal cavity there was one single band of omentum up to the mid abdomen.  2 additional 5 mm trochars were placed one in the left mid abdomen and one in the left lower abdomen. Both trochars were placed under direct optic local visualization. Next the single band of omentum up to the abdominal wall was lysed with cautery. We began running the intestine by identifying the transverse colon and cecum starting at the terminal ileum we ran the small intestine retrograde fashion about halfway into the jejunal running there was a band overlying the intestine connecting from a jejunal mesentery to the right retroperitoneal area. This was lysed and the band of omentum was removed. After this we continued running the intestine identifying the jejunal jejunal anastomosis which showed to have some lymph edema in the mesentery. There are no signs of perforation or ischemia. We continued running the intestine of the Roux limb and identified the GJ which appeared in normal condition. We also ran the biliary limb which was in good location. The entirety of the JJ was examined there appeared to be no internal hernia defect. And the entire small intestine one additional time to ensure there was no additional twisting which there was not. Therefore the pneumoperitoneum was removed and the skin was closed with 4-0 Monocryl in subcuticular fashion. of Marcaine was put into the subcutaneous tissue around the incisions. Dermabond was  put in place with dressing.  Findings:Small band of omental tongue causing internal hernia of small intestine.  Specimen:None  Implant:None  Blood loss:Minimal  Local anesthesia:44ml marcaine   Complications:none  Feliciana Rossetti, M.D. General, Bariatric, & Minimally Invasive Surgery [facility]   Condition on discharge: Improved  Disposition: Discharge disposition: 01-Home or Self Care            Allergies as of 04/21/2018      Reactions   Nsaids Other (See Comments)   Unable to take due to gastric bypass   Shellfish Allergy    Positive allergy test   Tamiflu [oseltamivir Phosphate] Hives         Medication List    STOP taking these medications   esomeprazole 40 MG capsule Commonly known as:  NEXIUM   HYDROcodone-acetaminophen 10-325 MG tablet Commonly known as:  NORCO   omeprazole 20 MG capsule Commonly known as:  PRILOSEC   promethazine 25 MG tablet Commonly known as:  PHENERGAN     TAKE these medications   acetaminophen 325 MG tablet Commonly known as:  TYLENOL Take 2 tablets (650 mg total) by mouth every 6 (six) hours as needed (pain).  amLODipine 10 MG tablet Commonly known as:  NORVASC   calcium citrate-vitamin D 315-200 MG-UNIT tablet Commonly known as:  CITRACAL+D Take 1 tablet by mouth 2 (two) times daily.   cyanocobalamin 1000 MCG/ML injection Commonly known as:  (VITAMIN B-12) Inject 1 mL into the muscle every 30 (thirty) days.   dicyclomine 20 MG tablet Commonly known as:  BENTYL Take 1 tablet (20 mg total) by mouth 2 (two) times daily.   FERRALET 90 90-1 MG Tabs   ferrous sulfate 325 (65 FE) MG tablet Take 325 mg by mouth 2 (two) times daily with a meal.   metoCLOPramide 10 MG tablet Commonly known as:  REGLAN Take 1 tablet (10 mg total) by mouth 2 (two) times daily as needed (headache).   MULTIPLE VITAMIN PO Take by mouth.   pantoprazole 40 MG  tablet Commonly known as:  PROTONIX Take 40 mg by mouth daily.   polyethylene glycol powder powder Commonly known as:  GLYCOLAX/MIRALAX Take 17 g by mouth daily. What changed:    when to take this  reasons to take this   potassium chloride 20 MEQ packet Commonly known as:  KLOR-CON Take 20 mEq by mouth 2 (two) times daily.   protein supplement shake Liqd Commonly known as:  PREMIER PROTEIN Take 59.1 mLs (2 oz total) by mouth 4 (four) times daily -  with meals and at bedtime.   sucralfate 1 GM/10ML suspension Commonly known as:  CARAFATE Take 10 mLs (1 g total) by mouth 4 (four) times daily.   topiramate 200 MG tablet Commonly known as:  TOPAMAX Take 100 mg by mouth 2 (two) times daily. For migraines   traMADol 50 MG tablet Commonly known as:  ULTRAM Take 1 tablet (50 mg total) by mouth every 6 (six) hours as needed (pain not controlled with tylenol). What changed:    when to take this  reasons to take this   vitamin B-1 250 MG tablet Take 250 mg by mouth daily.   zolpidem 12.5 MG CR tablet Commonly known as:  AMBIEN CR Take 12.5 mg by mouth at bedtime as needed for sleep. What changed:  Another medication with the same name was removed. Continue taking this medication, and follow the directions you see here.         Follow-up Information    McNatt, Viviann SpareSteven, MD Follow up.   Specialty:  Surgery Why:  call for follow up appointment and let him know about your surgery.  Follow your Bariatric diet till you see him.   Contact information: [address] [place] [address] [phone]        Kinsinger, De BlanchLuke Aaron, MD Follow up.   Specialty:  General Surgery Why:  Dr. Sheliah HatchKinsinger did your surgery, you can call if you have any issues.   Contact information: [address] [place] [address] [phone]        Irving CopasWebb, Meghan H, PA-C Follow up.   Specialty:  Physician Assistant Why:  call for  medical issues and your other mediciation needs Contact information: [address] [place] [address] [phone]           Signed: Sherrie GeorgeJENNINGS,Deyci Gesell 04/21/2018, 10:16 AM         Cosigned by: Violeta Gelinashompson, Burke, MD at 04/21/2018 10:45 AM  Revision History                   Routing History

## 2019-04-02 ENCOUNTER — Emergency Department (HOSPITAL_BASED_OUTPATIENT_CLINIC_OR_DEPARTMENT_OTHER)
Admission: EM | Admit: 2019-04-02 | Discharge: 2019-04-02 | Disposition: A | Discharge status: Home / Self Care | Payer: BLUE CROSS/BLUE SHIELD | Attending: Emergency Medicine | Admitting: Emergency Medicine

## 2019-04-02 ENCOUNTER — Emergency Department (HOSPITAL_BASED_OUTPATIENT_CLINIC_OR_DEPARTMENT_OTHER): Payer: BLUE CROSS/BLUE SHIELD

## 2019-04-02 ENCOUNTER — Encounter (HOSPITAL_BASED_OUTPATIENT_CLINIC_OR_DEPARTMENT_OTHER): Payer: Self-pay | Admitting: Emergency Medicine

## 2019-04-02 ENCOUNTER — Other Ambulatory Visit: Payer: Self-pay

## 2019-04-02 DIAGNOSIS — Y92009 Unspecified place in unspecified non-institutional (private) residence as the place of occurrence of the external cause: Secondary | ICD-10-CM | POA: Insufficient documentation

## 2019-04-02 DIAGNOSIS — S92511A Displaced fracture of proximal phalanx of right lesser toe(s), initial encounter for closed fracture: Secondary | ICD-10-CM | POA: Insufficient documentation

## 2019-04-02 DIAGNOSIS — J45909 Unspecified asthma, uncomplicated: Secondary | ICD-10-CM | POA: Insufficient documentation

## 2019-04-02 DIAGNOSIS — S99921A Unspecified injury of right foot, initial encounter: Secondary | ICD-10-CM | POA: Diagnosis present

## 2019-04-02 DIAGNOSIS — W2209XA Striking against other stationary object, initial encounter: Secondary | ICD-10-CM | POA: Insufficient documentation

## 2019-04-02 DIAGNOSIS — Y998 Other external cause status: Secondary | ICD-10-CM | POA: Diagnosis not present

## 2019-04-02 DIAGNOSIS — Z79899 Other long term (current) drug therapy: Secondary | ICD-10-CM | POA: Diagnosis not present

## 2019-04-02 DIAGNOSIS — Y9389 Activity, other specified: Secondary | ICD-10-CM | POA: Insufficient documentation

## 2019-04-02 NOTE — ED Triage Notes (Signed)
Pt injured right foot last night.  Pain and bruising to right foot noted.
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
## 2019-09-19 ENCOUNTER — Other Ambulatory Visit: Payer: Self-pay

## 2019-09-19 ENCOUNTER — Emergency Department (HOSPITAL_BASED_OUTPATIENT_CLINIC_OR_DEPARTMENT_OTHER): Payer: Medicaid Other

## 2019-09-19 ENCOUNTER — Emergency Department (HOSPITAL_BASED_OUTPATIENT_CLINIC_OR_DEPARTMENT_OTHER)
Admission: EM | Admit: 2019-09-19 | Discharge: 2019-09-20 | Disposition: A | Discharge status: Home / Self Care | Payer: Medicaid Other | Attending: Emergency Medicine | Admitting: Emergency Medicine

## 2019-09-19 ENCOUNTER — Encounter (HOSPITAL_BASED_OUTPATIENT_CLINIC_OR_DEPARTMENT_OTHER): Payer: Self-pay

## 2019-09-19 DIAGNOSIS — J45909 Unspecified asthma, uncomplicated: Secondary | ICD-10-CM | POA: Insufficient documentation

## 2019-09-19 DIAGNOSIS — Z888 Allergy status to other drugs, medicaments and biological substances status: Secondary | ICD-10-CM | POA: Diagnosis not present

## 2019-09-19 DIAGNOSIS — Y939 Activity, unspecified: Secondary | ICD-10-CM | POA: Diagnosis not present

## 2019-09-19 DIAGNOSIS — Z91013 Allergy to seafood: Secondary | ICD-10-CM | POA: Diagnosis not present

## 2019-09-19 DIAGNOSIS — Z79899 Other long term (current) drug therapy: Secondary | ICD-10-CM | POA: Diagnosis not present

## 2019-09-19 DIAGNOSIS — Y929 Unspecified place or not applicable: Secondary | ICD-10-CM | POA: Insufficient documentation

## 2019-09-19 DIAGNOSIS — Y990 Civilian activity done for income or pay: Secondary | ICD-10-CM | POA: Insufficient documentation

## 2019-09-19 DIAGNOSIS — I10 Essential (primary) hypertension: Secondary | ICD-10-CM | POA: Diagnosis not present

## 2019-09-19 DIAGNOSIS — S9782XA Crushing injury of left foot, initial encounter: Secondary | ICD-10-CM | POA: Insufficient documentation

## 2019-09-19 DIAGNOSIS — Z886 Allergy status to analgesic agent status: Secondary | ICD-10-CM | POA: Insufficient documentation

## 2019-09-19 DIAGNOSIS — W208XXA Other cause of strike by thrown, projected or falling object, initial encounter: Secondary | ICD-10-CM | POA: Insufficient documentation

## 2019-09-19 MED ORDER — HYDROCODONE-ACETAMINOPHEN 5-325 MG PO TABS
2.0000 | ORAL_TABLET | Freq: Once | ORAL | Status: AC
  Administered 2019-09-19: 2 via ORAL
  Filled 2019-09-19: qty 2

## 2019-09-19 MED ORDER — HYDROCODONE-ACETAMINOPHEN 5-325 MG PO TABS: 2.0000 | ORAL_TABLET | ORAL | 0 refills | Status: DC | PRN

## 2019-09-19 NOTE — ED Triage Notes (Signed)
Pt states a metal divider fell on her left foot at work ~530pm-NAD- to triage in w/c

## 2019-09-19 NOTE — ED Provider Notes (Signed)
[facility] Provider Note: [name], MD, FACEP  CSN: [id number] MRN: [id number] ARRIVAL: 09/19/19 at 2202 ROOM: MHFT1/MHFT1   CHIEF COMPLAINT  Foot Injury   HISTORY OF PRESENT ILLNESS  09/19/19 11:10 PM Anna [name] is a 49 y.o. female who had a metal sign fall onto her left foot at work about 5:30 PM today.  She is now having pain in the left foot, primarily on the dorsal aspect of the foot, which she rates as an 8 out of 10.  It is worse with palpation or attempted weightbearing and she is not able to ambulate on it.  She has not taken anything for it.  She denies other injury.   Past Medical History:  Diagnosis Date  . Anemia   . Asthma   . Chronic abdominal pain   . GERD (gastroesophageal reflux disease)   . Hypertension   . Insomnia   . Migraine     Past Surgical History:  Procedure Laterality Date  . ANKLE SURGERY    . DILATION AND CURETTAGE OF UTERUS    . FOOT SURGERY    . GASTRIC BYPASS    . LAPAROSCOPY ABDOMEN DIAGNOSTIC  04/20/2018  . LYSIS OF ADHESION N/A 04/20/2018   Procedure: LAPAROSCOPIC LYSIS OF ADHESION;  Surgeon: Kinsinger, De Blanch, MD;  Location: [facility] OR;  Service: General;  Laterality: N/A;  . TUBAL LIGATION    . VENTRAL HERNIA REPAIR N/A 04/20/2018   Procedure: DIAGNOSTIC LAPAROSCOPY;  Surgeon: Kinsinger, De Blanch, MD;  Location: [facility] OR;  Service: General;  Laterality: N/A;    Family History  Problem Relation Age of Onset  . Hypertension Mother   . Hypertension Father   . Heart attack Father   . Diabetes Neg Hx   . Hyperlipidemia Neg Hx   . Sudden death Neg Hx     Social History   Tobacco Use  . Smoking status: Never Smoker  . Smokeless tobacco: Never Used  Substance Use Topics  . Alcohol use: No  . Drug use: No    Prior to Admission medications   Medication Sig Start Date End Date Taking? Authorizing Provider  amLODipine (NORVASC) 10 MG tablet  05/04/12   [provider]  calcium citrate-vitamin  D (CITRACAL+D) 315-200 MG-UNIT per tablet Take 1 tablet by mouth 2 (two) times daily.    [provider]  cyanocobalamin (,VITAMIN B-12,) 1000 MCG/ML injection Inject 1 mL into the muscle every 30 (thirty) days. 09/05/17   [provider]  Fe Cbn-Fe Gluc-FA-B12-C-DSS (FERRALET 90) 90-1 MG TABS  04/29/12   [provider]  ferrous sulfate 325 (65 FE) MG tablet Take 325 mg by mouth 2 (two) times daily with a meal.     [provider]  HYDROcodone-acetaminophen (NORCO) 5-325 MG tablet Take 2 tablets by mouth every 4 (four) hours as needed for severe pain. 09/19/19   Rudy Domek, MD  MULTIPLE VITAMIN PO Take by mouth.    [provider]  pantoprazole (PROTONIX) 40 MG tablet Take 40 mg by mouth daily. 04/19/18   [provider]  polyethylene glycol powder (MIRALAX) powder Take 17 g by mouth daily. Patient taking differently: Take 17 g by mouth as needed for mild constipation.  11/02/17   Palumbo, April, MD  potassium chloride (KLOR-CON) 20 MEQ packet Take 20 mEq by mouth 2 (two) times daily.     [provider]  protein supplement shake (PREMIER PROTEIN) LIQD Take 59.1 mLs (2 oz total) by mouth  4 (four) times daily -  with meals and at bedtime. 04/21/18   Sherrie George, PA-C  Thiamine HCl (VITAMIN B-1) 250 MG tablet Take 250 mg by mouth daily.    [provider]  topiramate (TOPAMAX) 200 MG tablet Take 100 mg by mouth 2 (two) times daily. For migraines    [provider]  zolpidem (AMBIEN CR) 12.5 MG CR tablet Take 12.5 mg by mouth at bedtime as needed for sleep.    [provider]  dicyclomine (BENTYL) 20 MG tablet Take 1 tablet (20 mg total) by mouth 2 (two) times daily. Patient not taking: Reported on 04/20/2018 11/02/17 09/19/19  Palumbo, April, MD  metoCLOPramide (REGLAN) 10 MG tablet Take 1 tablet (10 mg total) by mouth 2 (two) times daily as needed (headache). Patient not taking: Reported on 04/20/2018 01/02/16  09/19/19  Tomasita Crumble, MD  sucralfate (CARAFATE) 1 GM/10ML suspension Take 10 mLs (1 g total) by mouth 4 (four) times daily. Patient not taking: Reported on 04/20/2018 11/22/12 09/19/19  Palumbo, April, MD    Allergies Nsaids, Shellfish allergy, and Tamiflu [oseltamivir phosphate]   REVIEW OF SYSTEMS  Negative except as noted here or in the History of Present Illness.   PHYSICAL EXAMINATION  Initial Vital Signs Blood pressure 123/86, pulse 77, temperature 98.5 F (36.9 C), temperature source Oral, resp. rate 20, height 5\' 6"  (1.676 m), weight 88.5 kg, last menstrual period 08/29/2019, SpO2 100 %.  Examination General: Well-developed, well-nourished female in no acute distress; appearance consistent with age of record HENT: normocephalic; atraumatic Eyes: Normal appearance Neck: supple Heart: regular rate and rhythm Lungs: clear to auscultation bilaterally Abdomen: soft; nondistended; nontender;  bowel sounds present Extremities: No deformity; tenderness of dorsal left foot without ecchymosis, mild swelling present, toes distally neurovascularly intact with intact tendon function Neurologic: Awake, alert and oriented; motor function intact in all extremities and symmetric; no facial droop Skin: Warm and dry Psychiatric: Tearful   RESULTS  Summary of this visit's results, reviewed and interpreted by myself:   EKG Interpretation  Date/Time:    Ventricular Rate:    PR Interval:    QRS Duration:   QT Interval:    QTC Calculation:   R Axis:     Text Interpretation:        Laboratory Studies: No results found for this or any previous visit (from the past 24 hour(s)). Imaging Studies: DG Foot Complete Left  Result Date: 09/19/2019 CLINICAL DATA:  Injury to the foot EXAM: LEFT FOOT - COMPLETE 3+ VIEW COMPARISON:  None. FINDINGS: There is no evidence of fracture or dislocation. Bipartite medial sesamoid. There is no evidence of arthropathy or other focal bone abnormality. Soft  tissues are unremarkable. IMPRESSION: Negative. Electronically Signed   By: 11/17/2019 M.D.   On: 09/19/2019 22:32    ED COURSE and MDM  Nursing notes, initial and subsequent vitals signs, including pulse oximetry, reviewed and interpreted by myself.  Vitals:   09/19/19 2211  BP: 123/86  Pulse: 77  Resp: 20  Temp: 98.5 F (36.9 C)  TempSrc: Oral  SpO2: 100%  Weight: 88.5 kg  Height: 5\' 6"  (1.676 m)   Medications  HYDROcodone-acetaminophen (NORCO/VICODIN) 5-325 MG per tablet 2 tablet (has no administration in time range)    No evidence of fracture on radiograph.  No evidence of compartment syndrome on exam.  We will treat the patient's pain in place and crutches.  She was advised that swelling may increase over the next 2 to 3  days before it starts to resolve.  PROCEDURES  Procedures   ED DIAGNOSES     ICD-10-CM   1. Crush injury of left foot, initial encounter  S97.82XA        Gwenetta Devos, Jenny Reichmann, MD 09/19/19 2321

## 2020-06-23 ENCOUNTER — Emergency Department (HOSPITAL_BASED_OUTPATIENT_CLINIC_OR_DEPARTMENT_OTHER): Payer: 59

## 2020-06-23 ENCOUNTER — Other Ambulatory Visit (HOSPITAL_BASED_OUTPATIENT_CLINIC_OR_DEPARTMENT_OTHER): Payer: Self-pay | Admitting: Emergency Medicine

## 2020-06-23 ENCOUNTER — Encounter (HOSPITAL_BASED_OUTPATIENT_CLINIC_OR_DEPARTMENT_OTHER): Payer: Self-pay | Admitting: Emergency Medicine

## 2020-06-23 ENCOUNTER — Emergency Department (HOSPITAL_BASED_OUTPATIENT_CLINIC_OR_DEPARTMENT_OTHER)
Admission: EM | Admit: 2020-06-23 | Discharge: 2020-06-23 | Disposition: A | Discharge status: Home / Self Care | Payer: 59 | Attending: Emergency Medicine | Admitting: Emergency Medicine

## 2020-06-23 ENCOUNTER — Other Ambulatory Visit: Payer: Self-pay

## 2020-06-23 DIAGNOSIS — R109 Unspecified abdominal pain: Secondary | ICD-10-CM | POA: Insufficient documentation

## 2020-06-23 DIAGNOSIS — J45909 Unspecified asthma, uncomplicated: Secondary | ICD-10-CM | POA: Insufficient documentation

## 2020-06-23 DIAGNOSIS — I10 Essential (primary) hypertension: Secondary | ICD-10-CM | POA: Insufficient documentation

## 2020-06-23 DIAGNOSIS — M546 Pain in thoracic spine: Secondary | ICD-10-CM | POA: Diagnosis not present

## 2020-06-23 DIAGNOSIS — M549 Dorsalgia, unspecified: Secondary | ICD-10-CM | POA: Diagnosis present

## 2020-06-23 DIAGNOSIS — Z79899 Other long term (current) drug therapy: Secondary | ICD-10-CM | POA: Diagnosis not present

## 2020-06-23 HISTORY — DX: Disorder of thyroid, unspecified: E07.9

## 2020-06-23 LAB — COMPREHENSIVE METABOLIC PANEL
ALT: 7 U/L (ref 0–44)
AST: 16 U/L (ref 15–41)
Albumin: 3.6 g/dL (ref 3.5–5.0)
Alkaline Phosphatase: 116 U/L (ref 38–126)
Anion gap: 7 (ref 5–15)
BUN: 15 mg/dL (ref 6–20)
CO2: 22 mmol/L (ref 22–32)
Calcium: 8.5 mg/dL — ABNORMAL LOW (ref 8.9–10.3)
Chloride: 110 mmol/L (ref 98–111)
Creatinine, Ser: 0.72 mg/dL (ref 0.44–1.00)
GFR, Estimated: >60 mL/min (ref >60)
Glucose, Bld: 91 mg/dL (ref 70–99)
Potassium: 3.7 mmol/L (ref 3.5–5.1)
Sodium: 139 mmol/L (ref 135–145)
Total Bilirubin: 0.4 mg/dL (ref 0.3–1.2)
Total Protein: 7.5 g/dL (ref 6.5–8.1)

## 2020-06-23 LAB — URINALYSIS, ROUTINE W REFLEX MICROSCOPIC
Bilirubin Urine: NEGATIVE
Glucose, UA: NEGATIVE mg/dL
Hgb urine dipstick: NEGATIVE
Ketones, ur: NEGATIVE mg/dL
Leukocytes,Ua: NEGATIVE
Nitrite: NEGATIVE
Protein, ur: NEGATIVE mg/dL
Specific Gravity, Urine: 1.02 (ref 1.005–1.030)
pH: 6.5 (ref 5.0–8.0)

## 2020-06-23 LAB — CBC WITH DIFFERENTIAL/PLATELET
Abs Immature Granulocytes: 0.01 10*3/uL (ref 0.00–0.07)
Basophils Absolute: 0 10*3/uL (ref 0.0–0.1)
Basophils Relative: 1 %
Eosinophils Absolute: 0.1 10*3/uL (ref 0.0–0.5)
Eosinophils Relative: 1 %
HCT: 36.7 % (ref 36.0–46.0)
Hemoglobin: 11.6 g/dL — ABNORMAL LOW (ref 12.0–15.0)
Immature Granulocytes: 0 %
Lymphocytes Relative: 57 %
Lymphs Abs: 2.4 10*3/uL (ref 0.7–4.0)
MCH: 28 pg (ref 26.0–34.0)
MCHC: 31.6 g/dL (ref 30.0–36.0)
MCV: 88.6 fL (ref 80.0–100.0)
Monocytes Absolute: 0.4 10*3/uL (ref 0.1–1.0)
Monocytes Relative: 9 %
Neutro Abs: 1.4 10*3/uL — ABNORMAL LOW (ref 1.7–7.7)
Neutrophils Relative %: 32 %
Platelets: 332 10*3/uL (ref 150–400)
RBC: 4.14 MIL/uL (ref 3.87–5.11)
RDW: 17.4 % — ABNORMAL HIGH (ref 11.5–15.5)
WBC: 4.2 10*3/uL (ref 4.0–10.5)
nRBC: 0 % (ref 0.0–0.2)

## 2020-06-23 LAB — D-DIMER, QUANTITATIVE: D-Dimer, Quant: <0.27 ug/mL-FEU (ref 0.00–0.50)

## 2020-06-23 LAB — TROPONIN I (HIGH SENSITIVITY)
Troponin I (High Sensitivity): <2 ng/L (ref <18)
Troponin I (High Sensitivity): <2 ng/L (ref <18)

## 2020-06-23 LAB — LIPASE, BLOOD: Lipase: 31 U/L (ref 11–51)

## 2020-06-23 LAB — PREGNANCY, URINE: Preg Test, Ur: NEGATIVE

## 2020-06-23 MED ORDER — METHOCARBAMOL 750 MG PO TABS: 750.0000 mg | ORAL_TABLET | Freq: Two times a day (BID) | ORAL | 0 refills | Status: DC | PRN

## 2020-06-23 MED ORDER — IOHEXOL 300 MG/ML  SOLN
100.0000 mL | Freq: Once | INTRAMUSCULAR | Status: AC | PRN
  Administered 2020-06-23: 100 mL via INTRAVENOUS

## 2020-06-23 MED ORDER — LIDOCAINE 5 % EX PTCH: 1.0000 | MEDICATED_PATCH | CUTANEOUS | 0 refills | Status: DC

## 2020-06-23 MED FILL — LIDOCAINE PATCH 5%: 5 | 30 days supply | Qty: 30 | Fill #0

## 2020-06-23 MED FILL — METHOCARBAMOL 750 MG TABS: 750 | 10 days supply | Qty: 20 | Fill #0

## 2020-06-23 NOTE — ED Notes (Signed)
Patient transported to CT 

## 2020-06-23 NOTE — ED Triage Notes (Signed)
Back pain since yesterday but she states paion started in her leg and moved upt denies any injury ,  Has had freq UTI she states

## 2020-06-23 NOTE — Discharge Instructions (Addendum)
  Expect your soreness to increase over the next 2-3 days. Take it easy, but do not lay around too much as this may make any stiffness worse.  Acetaminophen: May take acetaminophen (generic for Tylenol), as needed, for pain. Your daily total maximum amount of acetaminophen from all sources should be limited to 4000mg/day for persons without liver problems, or 2000mg/day for those with liver problems. Methocarbamol: Methocarbamol (generic for Robaxin) is a muscle relaxer and can help relieve stiff muscles or muscle spasms.  Do not drive or perform other dangerous activities while taking this medication as it can cause drowsiness as well as changes in reaction time and judgement. Lidocaine patches: These are available via either prescription or over-the-counter. The over-the-counter option may be more economical one and are likely just as effective. There are multiple over-the-counter brands, such as Salonpas. Ice: May apply ice to the area over the next 24 hours for 15 minutes at a time to reduce pain, inflammation, and swelling, if present. Exercises: Be sure to perform the attached exercises starting with three times a week and working up to performing them daily. This is an essential part of preventing long term problems.  Follow up: Follow up with a primary care provider for any future management of these complaints. Be sure to follow up within 7-10 days. Return: Return to the ED should symptoms worsen.  For prescription assistance, may try using prescription discount sites or apps, such as goodrx.com 

## 2020-06-23 NOTE — ED Notes (Signed)
Review D/C papers with pt, reviewed Rx with pt, pt states understanding, pt denies questions at this time. 

## 2020-06-23 NOTE — ED Provider Notes (Signed)
[facility] EMERGENCY DEPARTMENT Provider Note   CSN: [id number] Arrival date & time: 06/23/20  0946     History Chief Complaint  Patient presents with  . Back Pain    Anna [name] is a 49 y.o. female.  HPI      Anna [name] is a 49 y.o. female, with a history of anemia, asthma, GERD, HTN, presenting to the ED with back pain noted yesterday upon waking.   Friday Nov 12, patient woke up with right calf pain, aching/soreness, mild to moderate in intensity, nonradiating.  No swelling, color change, numbness, weakness.  This discomfort resolved and has not recurred.  She states yesterday she began to have pain in the right mid back, described as a soreness, nonradiating, 7/10.  She has taken Tylenol without improvement.  The day before yesterday she states she was at a funeral and was on her feet all day. Episode of chest pain last night around 11:30 pm, lasted for a minute, sore, nonradiating. Has not recurred.  She endorses some abdominal discomfort, vague in description, generalized.  Denies history of DVT/PE, recent surgery, recent trauma, hormone use, recent travel/immobilization.   Denies fever/chills, acute cough, shortness of breath, other chest discomfort, numbness, weakness, saddle anesthesias, trauma, changes in bowel or bladder function, N/V/D, dizziness, syncope, urinary symptoms, or any other complaints.      Past Medical History:  Diagnosis Date  . Anemia   . Asthma   . Chronic abdominal pain   . GERD (gastroesophageal reflux disease)   . Hypertension   . Insomnia   . Migraine   . Thyroid disease     Patient Active Problem List   Diagnosis Date Noted  . Abdominal pain 04/20/2018  . Plantar fasciitis of right foot 07/31/2013  . Right ankle pain 01/12/2013  . Left knee pain 05/08/2012    Past Surgical History:  Procedure Laterality Date  . ANKLE SURGERY    . DILATION AND CURETTAGE OF UTERUS    . FOOT SURGERY    .  GASTRIC BYPASS    . LAPAROSCOPY ABDOMEN DIAGNOSTIC  04/20/2018  . LYSIS OF ADHESION N/A 04/20/2018   Procedure: LAPAROSCOPIC LYSIS OF ADHESION;  Surgeon: Kinsinger, De Blanch, MD;  Location: [facility] OR;  Service: General;  Laterality: N/A;  . TUBAL LIGATION    . VENTRAL HERNIA REPAIR N/A 04/20/2018   Procedure: DIAGNOSTIC LAPAROSCOPY;  Surgeon: Kinsinger, De Blanch, MD;  Location: [facility] OR;  Service: General;  Laterality: N/A;     OB History   No obstetric history on file.     Family History  Problem Relation Age of Onset  . Hypertension Mother   . Hypertension Father   . Heart attack Father   . Diabetes Neg Hx   . Hyperlipidemia Neg Hx   . Sudden death Neg Hx     Social History   Tobacco Use  . Smoking status: Never Smoker  . Smokeless tobacco: Never Used  Vaping Use  . Vaping Use: Never used  Substance Use Topics  . Alcohol use: No  . Drug use: No    Home Medications Prior to Admission medications   Medication Sig Start Date End Date Taking? Authorizing Provider  amLODipine (NORVASC) 10 MG tablet  05/04/12  Yes [provider]  calcium citrate-vitamin D (CITRACAL+D) 315-200 MG-UNIT per tablet Take 1 tablet by mouth 2 (two) times daily.   Yes [provider]  cyanocobalamin (,VITAMIN B-12,) 1000 MCG/ML injection Inject 1 mL into the muscle  every 30 (thirty) days. 09/05/17  Yes [provider]  ferrous sulfate 325 (65 FE) MG tablet Take 325 mg by mouth 2 (two) times daily with a meal.    Yes [provider]  MULTIPLE VITAMIN PO Take by mouth.   Yes [provider]  pantoprazole (PROTONIX) 40 MG tablet Take 40 mg by mouth daily. 04/19/18  Yes [provider]  potassium chloride (KLOR-CON) 20 MEQ packet Take 20 mEq by mouth 2 (two) times daily.    Yes [provider]  Thiamine HCl (VITAMIN B-1) 250 MG tablet Take 250 mg by mouth daily.   Yes [provider]  topiramate (TOPAMAX) 200 MG tablet Take 100 mg by  mouth 2 (two) times daily. For migraines   Yes [provider]  zolpidem (AMBIEN CR) 12.5 MG CR tablet Take 12.5 mg by mouth at bedtime as needed for sleep.   Yes [provider]  Fe Cbn-Fe Gluc-FA-B12-C-DSS (FERRALET 90) 90-1 MG TABS  04/29/12   [provider]  HYDROcodone-acetaminophen (NORCO) 5-325 MG tablet Take 2 tablets by mouth every 4 (four) hours as needed for severe pain. 09/19/19   Molpus, John, MD  lidocaine (LIDODERM) 5 % Place 1 patch onto the skin daily. Remove & Discard patch within 12 hours or as directed by MD 06/23/20   Zykeem Bauserman, Hillard Danker, PA-C  methocarbamol (ROBAXIN) 750 MG tablet Take 1 tablet (750 mg total) by mouth 2 (two) times daily as needed for muscle spasms (or muscle tightness). 06/23/20   Katalin Colledge C, PA-C  polyethylene glycol powder (MIRALAX) powder Take 17 g by mouth daily. Patient taking differently: Take 17 g by mouth as needed for mild constipation.  11/02/17   Palumbo, April, MD  protein supplement shake (PREMIER PROTEIN) LIQD Take 59.1 mLs (2 oz total) by mouth 4 (four) times daily -  with meals and at bedtime. 04/21/18   Sherrie George, PA-C  dicyclomine (BENTYL) 20 MG tablet Take 1 tablet (20 mg total) by mouth 2 (two) times daily. Patient not taking: Reported on 04/20/2018 11/02/17 09/19/19  Palumbo, April, MD  metoCLOPramide (REGLAN) 10 MG tablet Take 1 tablet (10 mg total) by mouth 2 (two) times daily as needed (headache). Patient not taking: Reported on 04/20/2018 01/02/16 09/19/19  Tomasita Crumble, MD  sucralfate (CARAFATE) 1 GM/10ML suspension Take 10 mLs (1 g total) by mouth 4 (four) times daily. Patient not taking: Reported on 04/20/2018 11/22/12 09/19/19  Nicanor Alcon, April, MD    Allergies    Nsaids, Shellfish allergy, and Tamiflu [oseltamivir phosphate]  Review of Systems   Review of Systems  Constitutional: Negative for chills, diaphoresis and fever.  Respiratory: Negative for cough and shortness of breath.   Cardiovascular: Positive  for chest pain. Negative for leg swelling.  Gastrointestinal: Negative for abdominal pain, diarrhea, nausea and vomiting.  Genitourinary: Negative for dysuria, flank pain and hematuria.  Musculoskeletal: Positive for back pain.  Neurological: Negative for dizziness, weakness, numbness and headaches.  All other systems reviewed and are negative.   Physical Exam Updated Vital Signs BP 133/90 (BP Location: Right Arm)   Pulse 72   Temp 98.4 F (36.9 C) (Oral)   Resp 16   Ht 5\' 6"  (1.676 m)   Wt 86.2 kg   SpO2 100%   BMI 30.67 kg/m   Physical Exam Vitals and nursing note reviewed.  Constitutional:      General: She is not in acute distress.    Appearance: She is well-developed. She is not  diaphoretic.  HENT:     Head: Normocephalic and atraumatic.     Mouth/Throat:     Mouth: Mucous membranes are moist.     Pharynx: Oropharynx is clear.  Eyes:     Conjunctiva/sclera: Conjunctivae normal.  Cardiovascular:     Rate and Rhythm: Normal rate and regular rhythm.     Pulses: Normal pulses.          Radial pulses are 2+ on the right side and 2+ on the left side.       Posterior tibial pulses are 2+ on the right side and 2+ on the left side.     Heart sounds: Normal heart sounds.     Comments: Tactile temperature in the extremities appropriate and equal bilaterally. Pulmonary:     Effort: Pulmonary effort is normal. No respiratory distress.     Breath sounds: Normal breath sounds.  Abdominal:     Palpations: Abdomen is soft.     Tenderness: There is abdominal tenderness in the epigastric area, periumbilical area and left upper quadrant. There is no guarding.  Musculoskeletal:     Cervical back: Neck supple.       Back:     Right lower leg: No edema.     Left lower leg: No edema.     Comments: No current pain in the lower extremities.  No tenderness, swelling, color abnormality, increased warmth in the calves.  Lymphadenopathy:     Cervical: No cervical adenopathy.  Skin:     General: Skin is warm and dry.  Neurological:     Mental Status: She is alert.     Comments: No noted acute cognitive deficit. Sensation grossly intact to light touch in the extremities.   Grip strengths equal bilaterally.   Strength 5/5 in all extremities.  No gait disturbance.  Coordination intact.  Handles oral secretions without noted difficulty.  No noted phonation or speech deficit. No facial droop.   Psychiatric:        Mood and Affect: Mood and affect normal.        Speech: Speech normal.        Behavior: Behavior normal.     ED Results / Procedures / Treatments   Labs (all labs ordered are listed, but only abnormal results are displayed) Labs Reviewed  COMPREHENSIVE METABOLIC PANEL - Abnormal; Notable for the following components:      Result Value   Calcium 8.5 (*)    All other components within normal limits  CBC WITH DIFFERENTIAL/PLATELET - Abnormal; Notable for the following components:   Hemoglobin 11.6 (*)    RDW 17.4 (*)    Neutro Abs 1.4 (*)    All other components within normal limits  URINE CULTURE  URINALYSIS, ROUTINE W REFLEX MICROSCOPIC  PREGNANCY, URINE  LIPASE, BLOOD  D-DIMER, QUANTITATIVE (NOT AT [facility])  TROPONIN I (HIGH SENSITIVITY)  TROPONIN I (HIGH SENSITIVITY)    EKG None  ED ECG REPORT   Date: 06/23/2020  Rate: 57  Rhythm: Sinus rhythm  QRS Axis: normal  Intervals: normal  ST/T Wave abnormalities: normal  Conduction Disutrbances:none  Narrative Interpretation:   Old EKG Reviewed: unchanged  I have personally reviewed the EKG tracing and agree with the computerized printout as noted.  Radiology DG Chest 2 View  Result Date: 06/23/2020 CLINICAL DATA:  Chest pain. EXAM: CHEST - 2 VIEW COMPARISON:  06/07/2019. FINDINGS: The heart size and mediastinal contours are within normal limits. Both lungs are clear. No visible pleural effusions or pneumothorax.  The visualized skeletal structures are unremarkable. IMPRESSION: No active  cardiopulmonary disease. Electronically Signed   By: Feliberto Harts MD   On: 06/23/2020 12:03   CT ABDOMEN PELVIS W CONTRAST  Result Date: 06/23/2020 CLINICAL DATA:  Left upper quadrant abdominal pain. EXAM: CT ABDOMEN AND PELVIS WITH CONTRAST TECHNIQUE: Multidetector CT imaging of the abdomen and pelvis was performed using the standard protocol following bolus administration of intravenous contrast. CONTRAST:  OMNIPAQUE IOHEXOL 300 MG/ML  SOLN COMPARISON:  04/20/2018 FINDINGS: Lower chest: No acute abnormality. Hepatobiliary: No focal liver abnormality is seen. No gallstones, gallbladder wall thickening, or biliary dilatation. No suspicious liver abnormality. The gallbladder is unremarkable. No biliary ductal dilatation identified. Pancreas: Unremarkable. No pancreatic ductal dilatation or surrounding inflammatory changes. Spleen: Normal in size without focal abnormality. Adrenals/Urinary Tract: Normal appearance of the adrenal glands. Small, less than 1 cm low-density kidney lesions are noted measuring up to 8 mm. These are technically too small to reliably characterize. No kidney mass or hydronephrosis. Urinary bladder is unremarkable. Stomach/Bowel: There are postsurgical changes from previous gastric bypass surgery. Increase caliber of short segment of small bowel at the jejunal anastomosis which measures up to 4 cm. The remaining bowel loops are normal in caliber without signs of bowel wall thickening or inflammation. The cecum is noted in the right upper quadrant of the abdomen. The appendix is visualized and appears within normal limits. Vascular/Lymphatic: Mild aortic atherosclerosis identified. No aneurysm. No abdominopelvic adenopathy. Reproductive: Uterus and bilateral adnexa are unremarkable. Other: There is no free fluid or fluid collections identified. Musculoskeletal: No acute or significant osseous findings. IMPRESSION: 1. No acute findings identified within the abdomen or pelvis. 2.  Status post gastric bypass surgery. Increase caliber of short segment of small bowel at the jejunal anastomosis is identified which measures up to 4 cm. The bowel loops proximal and distal to this area are normal in caliber. 3. Aortic atherosclerosis. Aortic Atherosclerosis (ICD10-I70.0). Electronically Signed   By: Signa Kell M.D.   On: 06/23/2020 13:16    Procedures Procedures (including critical care time)  Medications Ordered in ED Medications  iohexol (OMNIPAQUE) 300 MG/ML solution 100 mL (100 mLs Intravenous Contrast Given 06/23/20 1251)    ED Course  I have reviewed the triage vital signs and the nursing notes.  Pertinent labs & imaging results that were available during my care of the patient were reviewed by me and considered in my medical decision making (see chart for details).    MDM Rules/Calculators/A&P                          Patient presents with complaint of right mid back pain.  No evidence of neurovascular compromise. Patient is nontoxic appearing, afebrile, not tachycardic, not tachypneic, not hypotensive, maintains excellent SPO2 on room air, and is in no apparent distress.   I have reviewed the patient's chart to obtain more information.   I reviewed and interpreted the patient's labs and radiological studies. No leukocytosis.  No abnormalities on UA.  Other lab results reassuring as well. No acute abnormalities on CT.  The patient was given instructions for home care as well as return precautions. Patient voices understanding of these instructions, accepts the plan, and is comfortable with discharge.  Findings and plan of care discussed with Arby Barrette, MD. We also discussed the patient's imaging results.  Final Clinical Impression(s) / ED Diagnoses Final diagnoses:  Acute right-sided thoracic back pain    Rx /  DC Orders ED Discharge Orders         Ordered    methocarbamol (ROBAXIN) 750 MG tablet  2 times daily PRN,   Status:  Discontinued          06/23/20 1517    lidocaine (LIDODERM) 5 %  Every 24 hours,   Status:  Discontinued        06/23/20 1517    lidocaine (LIDODERM) 5 %  Every 24 hours        06/23/20 1517    methocarbamol (ROBAXIN) 750 MG tablet  2 times daily PRN        06/23/20 1517           Anselm Pancoast, PA-C 06/23/20 1553    Arby Barrette, MD 06/24/20 [phone]

## 2020-06-23 NOTE — ED Notes (Signed)
Pt ambulatory to bathroom with steady gait without assistance

## 2020-06-24 LAB — URINE CULTURE: Culture: <10000 — AB

## 2021-04-10 ENCOUNTER — Emergency Department (HOSPITAL_BASED_OUTPATIENT_CLINIC_OR_DEPARTMENT_OTHER): Payer: 59

## 2021-04-10 ENCOUNTER — Encounter (HOSPITAL_BASED_OUTPATIENT_CLINIC_OR_DEPARTMENT_OTHER): Payer: Self-pay | Admitting: *Deleted

## 2021-04-10 ENCOUNTER — Emergency Department (HOSPITAL_BASED_OUTPATIENT_CLINIC_OR_DEPARTMENT_OTHER)
Admission: EM | Admit: 2021-04-10 | Discharge: 2021-04-10 | Disposition: A | Discharge status: Home / Self Care | Payer: 59 | Attending: Emergency Medicine | Admitting: Emergency Medicine

## 2021-04-10 ENCOUNTER — Other Ambulatory Visit: Payer: Self-pay

## 2021-04-10 DIAGNOSIS — K219 Gastro-esophageal reflux disease without esophagitis: Secondary | ICD-10-CM | POA: Insufficient documentation

## 2021-04-10 DIAGNOSIS — R1030 Lower abdominal pain, unspecified: Secondary | ICD-10-CM | POA: Diagnosis present

## 2021-04-10 DIAGNOSIS — R630 Anorexia: Secondary | ICD-10-CM | POA: Insufficient documentation

## 2021-04-10 DIAGNOSIS — I10 Essential (primary) hypertension: Secondary | ICD-10-CM | POA: Diagnosis not present

## 2021-04-10 DIAGNOSIS — R110 Nausea: Secondary | ICD-10-CM | POA: Insufficient documentation

## 2021-04-10 DIAGNOSIS — J45909 Unspecified asthma, uncomplicated: Secondary | ICD-10-CM | POA: Diagnosis not present

## 2021-04-10 DIAGNOSIS — Z79899 Other long term (current) drug therapy: Secondary | ICD-10-CM | POA: Diagnosis not present

## 2021-04-10 LAB — URINALYSIS, ROUTINE W REFLEX MICROSCOPIC
Bilirubin Urine: NEGATIVE
Glucose, UA: NEGATIVE mg/dL
Hgb urine dipstick: NEGATIVE
Ketones, ur: NEGATIVE mg/dL
Leukocytes,Ua: NEGATIVE
Nitrite: NEGATIVE
Protein, ur: NEGATIVE mg/dL
Specific Gravity, Urine: 1.02 (ref 1.005–1.030)
pH: 7 (ref 5.0–8.0)

## 2021-04-10 LAB — CBC
HCT: 33 % — ABNORMAL LOW (ref 36.0–46.0)
Hemoglobin: 10.7 g/dL — ABNORMAL LOW (ref 12.0–15.0)
MCH: 28.6 pg (ref 26.0–34.0)
MCHC: 32.4 g/dL (ref 30.0–36.0)
MCV: 88.2 fL (ref 80.0–100.0)
Platelets: 288 10*3/uL (ref 150–400)
RBC: 3.74 MIL/uL — ABNORMAL LOW (ref 3.87–5.11)
RDW: 16.1 % — ABNORMAL HIGH (ref 11.5–15.5)
WBC: 14.3 10*3/uL — ABNORMAL HIGH (ref 4.0–10.5)
nRBC: 0 % (ref 0.0–0.2)

## 2021-04-10 LAB — COMPREHENSIVE METABOLIC PANEL
ALT: 6 U/L (ref 0–44)
AST: 11 U/L — ABNORMAL LOW (ref 15–41)
Albumin: 3.4 g/dL — ABNORMAL LOW (ref 3.5–5.0)
Alkaline Phosphatase: 83 U/L (ref 38–126)
Anion gap: 6 (ref 5–15)
BUN: 15 mg/dL (ref 6–20)
CO2: 21 mmol/L — ABNORMAL LOW (ref 22–32)
Calcium: 8.6 mg/dL — ABNORMAL LOW (ref 8.9–10.3)
Chloride: 109 mmol/L (ref 98–111)
Creatinine, Ser: 0.78 mg/dL (ref 0.44–1.00)
GFR, Estimated: >60 mL/min (ref >60)
Glucose, Bld: 145 mg/dL — ABNORMAL HIGH (ref 70–99)
Potassium: 3.5 mmol/L (ref 3.5–5.1)
Sodium: 136 mmol/L (ref 135–145)
Total Bilirubin: 0.4 mg/dL (ref 0.3–1.2)
Total Protein: 7.3 g/dL (ref 6.5–8.1)

## 2021-04-10 LAB — PREGNANCY, URINE: Preg Test, Ur: NEGATIVE

## 2021-04-10 LAB — LIPASE, BLOOD: Lipase: 26 U/L (ref 11–51)

## 2021-04-10 MED ORDER — IOHEXOL 350 MG/ML SOLN
100.0000 mL | Freq: Once | INTRAVENOUS | Status: AC | PRN
  Administered 2021-04-10: 85 mL via INTRAVENOUS

## 2021-04-10 MED ORDER — HYDROCODONE-ACETAMINOPHEN 5-325 MG PO TABS
1.0000 | ORAL_TABLET | Freq: Once | ORAL | Status: AC
  Administered 2021-04-10: 1 via ORAL
  Filled 2021-04-10: qty 1

## 2021-04-10 MED ORDER — MORPHINE SULFATE (PF) 4 MG/ML IV SOLN
4.0000 mg | Freq: Once | INTRAVENOUS | Status: AC
  Administered 2021-04-10: 4 mg via INTRAVENOUS
  Filled 2021-04-10: qty 1

## 2021-04-10 MED ORDER — ONDANSETRON HCL 4 MG/2ML IJ SOLN
4.0000 mg | Freq: Once | INTRAMUSCULAR | Status: AC
  Administered 2021-04-10: 4 mg via INTRAVENOUS
  Filled 2021-04-10: qty 2

## 2021-04-10 MED ORDER — HYDROCODONE-ACETAMINOPHEN 5-325 MG PO TABS: 1.0000 | ORAL_TABLET | Freq: Four times a day (QID) | ORAL | 0 refills | Status: DC | PRN

## 2021-04-10 NOTE — Discharge Instructions (Addendum)
You are seen evaluated in the emergency department today for further evaluation of lower abdominal pain.  As we discussed, your work-up today did not reveal any acute findings.  I will prescribe a short course of pain medication that you can take as needed for pain. Do not drink, work, or drink alcohol while taking this medication as it can make you feel very sleepy. Please follow-up with your primary care doctor.  Please return to the emergency department for worsening abdominal pain, new fever, new vaginal complaints, intractable nausea/vomiting/diarrhea, or any other concerns you may have.

## 2021-04-10 NOTE — ED Provider Notes (Signed)
[facility] EMERGENCY DEPARTMENT Provider Note   CSN: [id number] Arrival date & time: 04/10/21  1623     History Chief Complaint  Patient presents with   Abdominal Pain    Anna [name] is a 50 y.o. female with history of gastric bypass who presents to the emergency department today for abdominal pain that began upon waking up earlier this morning.  Her abdominal pain has been constant, rated 8 out of 10 in severity, sharp, and localized to the lower abdomen.  She reports associated anorexia and nausea.  Denies vomiting, diarrhea, urinary complaints, fever, chills, vaginal discharge, vaginal bleeding.  Her abdominal pain is worse with ambulation and when laying down.  Tylenol did not improve her abdominal pain.  She does mention that she was having sex with her husband last night and noticed an odor that was similar to her starting her period. The odor resolved and has not had that odor today.  She has a tubal ligation and uterine ablation and denies any chance of being pregnant.    Abdominal Pain Associated symptoms: no dysuria and no hematuria       Past Medical History:  Diagnosis Date   Anemia    Asthma    Chronic abdominal pain    GERD (gastroesophageal reflux disease)    Hypertension    Insomnia    Migraine    Thyroid disease     Patient Active Problem List   Diagnosis Date Noted   Abdominal pain 04/20/2018   Plantar fasciitis of right foot 07/31/2013   Right ankle pain 01/12/2013   Left knee pain 05/08/2012    Past Surgical History:  Procedure Laterality Date   ANKLE SURGERY     DILATION AND CURETTAGE OF UTERUS     FOOT SURGERY     GASTRIC BYPASS     LAPAROSCOPY ABDOMEN DIAGNOSTIC  04/20/2018   LYSIS OF ADHESION N/A 04/20/2018   Procedure: LAPAROSCOPIC LYSIS OF ADHESION;  Surgeon: Sheliah Hatch De Blanch, MD;  Location: [facility] OR;  Service: General;  Laterality: N/A;   TUBAL LIGATION     VENTRAL HERNIA REPAIR N/A 04/20/2018   Procedure: DIAGNOSTIC  LAPAROSCOPY;  Surgeon: Sheliah Hatch De Blanch, MD;  Location: [facility] OR;  Service: General;  Laterality: N/A;     OB History   No obstetric history on file.     Family History  Problem Relation Age of Onset   Hypertension Mother    Hypertension Father    Heart attack Father    Diabetes Neg Hx    Hyperlipidemia Neg Hx    Sudden death Neg Hx     Social History   Tobacco Use   Smoking status: Never   Smokeless tobacco: Never  Vaping Use   Vaping Use: Never used  Substance Use Topics   Alcohol use: No   Drug use: No    Home Medications Prior to Admission medications   Medication Sig Start Date End Date Taking? Authorizing Provider  HYDROcodone-acetaminophen (NORCO/VICODIN) 5-325 MG tablet Take 1 tablet by mouth every 6 (six) hours as needed. 04/10/21  Yes Couture, Cortni S, PA-C  amLODipine (NORVASC) 10 MG tablet  05/04/12   [provider]  calcium citrate-vitamin D (CITRACAL+D) 315-200 MG-UNIT per tablet Take 1 tablet by mouth 2 (two) times daily.    [provider]  cyanocobalamin (,VITAMIN B-12,) 1000 MCG/ML injection Inject 1 mL into the muscle every 30 (thirty) days. 09/05/17   [provider]  Fe Cbn-Fe Gluc-FA-B12-C-DSS (FERRALET 90) 90-1  MG TABS  04/29/12   [provider]  ferrous sulfate 325 (65 FE) MG tablet Take 325 mg by mouth 2 (two) times daily with a meal.     [provider]  HYDROcodone-acetaminophen (NORCO) 5-325 MG tablet Take 2 tablets by mouth every 4 (four) hours as needed for severe pain. 09/19/19   Molpus, John, MD  lidocaine (LIDODERM) 5 % PLACE 1 PATCH ON TO THE SKIN DAILY. REMOVE AND DISCARD PATCH WITHIN 12 HOURS OR AS DIRECTED BY MD 06/23/20 06/23/21  Harolyn RutherfordJoy, Shawn C, PA-C  methocarbamol (ROBAXIN) 750 MG tablet TAKE 1 TABLET BY MOUTH TWICE DAILY AS NEEDED FOR MUSCLE SPASMS OR MUSCLE TIGHTNESS 06/23/20 06/23/21  Joy, Shawn C, PA-C  MULTIPLE VITAMIN PO Take by mouth.    [provider]  pantoprazole (PROTONIX)  40 MG tablet Take 40 mg by mouth daily. 04/19/18   [provider]  polyethylene glycol powder (MIRALAX) powder Take 17 g by mouth daily. Patient taking differently: Take 17 g by mouth as needed for mild constipation.  11/02/17   Palumbo, April, MD  potassium chloride (KLOR-CON) 20 MEQ packet Take 20 mEq by mouth 2 (two) times daily.     [provider]  protein supplement shake (PREMIER PROTEIN) LIQD Take 59.1 mLs (2 oz total) by mouth 4 (four) times daily -  with meals and at bedtime. 04/21/18   Sherrie GeorgeJennings, Willard, PA-C  Thiamine HCl (VITAMIN B-1) 250 MG tablet Take 250 mg by mouth daily.    [provider]  topiramate (TOPAMAX) 200 MG tablet Take 100 mg by mouth 2 (two) times daily. For migraines    [provider]  zolpidem (AMBIEN CR) 12.5 MG CR tablet Take 12.5 mg by mouth at bedtime as needed for sleep.    [provider]  dicyclomine (BENTYL) 20 MG tablet Take 1 tablet (20 mg total) by mouth 2 (two) times daily. Patient not taking: Reported on 04/20/2018 11/02/17 09/19/19  Palumbo, April, MD  metoCLOPramide (REGLAN) 10 MG tablet Take 1 tablet (10 mg total) by mouth 2 (two) times daily as needed (headache). Patient not taking: Reported on 04/20/2018 01/02/16 09/19/19  Tomasita Crumbleni, Adeleke, MD  sucralfate (CARAFATE) 1 GM/10ML suspension Take 10 mLs (1 g total) by mouth 4 (four) times daily. Patient not taking: Reported on 04/20/2018 11/22/12 09/19/19  Nicanor AlconPalumbo, April, MD    Allergies    Nsaids, Shellfish allergy, and Tamiflu [oseltamivir phosphate]  Review of Systems   Review of Systems  Gastrointestinal:  Positive for abdominal pain.  Genitourinary:  Negative for difficulty urinating, dysuria and hematuria.  All other systems reviewed and are negative.  Physical Exam Updated Vital Signs BP 110/80   Pulse 65   Temp 98 F (36.7 C)   Resp 18   Ht 5\' 6"  (1.676 m)   Wt 86.2 kg   SpO2 100%   BMI 30.67 kg/m   Physical Exam Constitutional:      General:  She is not in acute distress.    Appearance: Normal appearance.  HENT:     Head: Normocephalic and atraumatic.  Eyes:     General:        Right eye: No discharge.        Left eye: No discharge.  Cardiovascular:     Comments: Regular rate and rhythm.  S1/S2 are distinct without any evidence of murmur, rubs, or gallops.  Radial pulses are 2+ bilaterally.  Dorsalis pedis pulses are 2+ bilaterally.  No evidence of pedal edema. Pulmonary:  Comments: Clear to auscultation bilaterally.  Normal effort.  No respiratory distress.  No evidence of wheezes, rales, or rhonchi heard throughout. Abdominal:     General: Abdomen is flat. Bowel sounds are normal. There is no distension.     Tenderness: There is no guarding or rebound.     Comments: Moderate tenderness across lower abdomen.  No upper abdominal tenderness.  Negative peritoneal signs.  Musculoskeletal:        General: Normal range of motion.     Cervical back: Neck supple.  Skin:    General: Skin is warm and dry.     Findings: No rash.  Neurological:     General: No focal deficit present.     Mental Status: She is alert.  Psychiatric:        Mood and Affect: Mood normal.        Behavior: Behavior normal.    ED Results / Procedures / Treatments   Labs (all labs ordered are listed, but only abnormal results are displayed) Labs Reviewed  COMPREHENSIVE METABOLIC PANEL - Abnormal; Notable for the following components:      Result Value   CO2 21 (*)    Glucose, Bld 145 (*)    Calcium 8.6 (*)    Albumin 3.4 (*)    AST 11 (*)    All other components within normal limits  CBC - Abnormal; Notable for the following components:   WBC 14.3 (*)    RBC 3.74 (*)    Hemoglobin 10.7 (*)    HCT 33.0 (*)    RDW 16.1 (*)    All other components within normal limits  LIPASE, BLOOD  URINALYSIS, ROUTINE W REFLEX MICROSCOPIC  PREGNANCY, URINE    EKG None  Radiology CT ABDOMEN PELVIS W CONTRAST  Result Date: 04/10/2021 CLINICAL DATA:   Mid to lower abdominal pain since this a.m. EXAM: CT ABDOMEN AND PELVIS WITH CONTRAST TECHNIQUE: Multidetector CT imaging of the abdomen and pelvis was performed using the standard protocol following bolus administration of intravenous contrast. CONTRAST:  32mL OMNIPAQUE IOHEXOL 350 MG/ML SOLN COMPARISON:  June 23, 2020. FINDINGS: Lower chest: No acute abnormality. Hepatobiliary: No suspicious hepatic lesion. Gallbladder is unremarkable. No biliary ductal dilation. Pancreas: Within normal limits. Spleen: Within normal limits. Adrenals/Urinary Tract: Adrenal glands are unremarkable. Bilateral subcentimeter hypodense renal lesions which are technically too small to accurately characterize but statistically likely represent cysts. No hydronephrosis. Bladder is unremarkable for degree of distension. Stomach/Bowel: Postsurgical change of Roux-en-Y gastric bypass. No pathologic dilation of small or large bowel. Normal appendix. Terminal ileum is unremarkable. No evidence of acute bowel inflammation. Vascular/Lymphatic: Aortic atherosclerosis without aneurysmal dilation. No pathologically enlarged abdominal or pelvic lymph nodes. Reproductive: Enhancing 2.7 cm mass in the uterine fundus, likely reflecting a uterine fibroid. Left ovary is unremarkable. Simple appearing 3.7 cm right ovarian cyst. Other: Trace pelvic free fluid, likely physiologic. No pneumoperitoneum. Musculoskeletal: No acute or significant osseous findings. IMPRESSION: 1. No acute findings in the abdomen or pelvis. 2. Postsurgical change of Roux-en-Y gastric bypass. 3. Simple appearing 3.7 cm right ovarian cyst. No follow-up imaging recommended. Note: This recommendation does not apply to premenarchal patients and to those with increased risk (genetic, family history, elevated tumor markers or other high-risk factors) of ovarian cancer. Reference: JACR 2020 Feb; 17(2):248-254 4. Enhancing 2.7 cm mass in the uterine fundus, likely reflecting a uterine  fibroid. 5.  Aortic Atherosclerosis (ICD10-I70.0). Electronically Signed   By: Maudry Mayhew M.D.   On: 04/10/2021 18:23  Procedures Procedures   Medications Ordered in ED Medications  HYDROcodone-acetaminophen (NORCO/VICODIN) 5-325 MG per tablet 1 tablet (1 tablet Oral Given 04/10/21 1718)  iohexol (OMNIPAQUE) 350 MG/ML injection 100 mL (85 mLs Intravenous Contrast Given 04/10/21 1753)  morphine 4 MG/ML injection 4 mg (4 mg Intravenous Given 04/10/21 1925)  ondansetron (ZOFRAN) injection 4 mg (4 mg Intravenous Given 04/10/21 1946)    ED Course  I have reviewed the triage vital signs and the nursing notes.  Pertinent labs & imaging results that were available during my care of the patient were reviewed by me and considered in my medical decision making (see chart for details).    MDM Rules/Calculators/A&P                          Anna [name] is a 28 female who presents to the emergency department for further evaluation of lower abdominal pain.  Abdominal exam without peritoneal signs.  No evidence of acute abdomen at this time.  Patient is well-appearing.  Patient is in a monogamous relationship and is not having any vaginal complaints.  I deferred pelvic exam at this time.  I considered and have a low suspicion for ovarian torsion given the history and presentation.  Given the work-up, I have a low suspicion for acute hepatobiliary disease including acute cholecystitis, pancreatitis, PUD, gastric perforation, infectious processes, appendicitis, bowel obstruction, and diverticulitis.  CBC revealed leukocytosis.  CMP was grossly normal.  Urinalysis without any evidence of infection.  Lipase normal.  Pregnancy was negative.  CT abdomen revealed no acute findings.  Was evidence of a right ovarian cyst along with a uterine fibroid.  This could be a possible source of her pain.  She is currently hemodynamically stable.  Her pain was controlled adequately during her visit.  Strict return  precautions were given. Will have her follow-up with her primary care provider.  She is safe for discharge.  Final Clinical Impression(s) / ED Diagnoses Final diagnoses:  Lower abdominal pain    Rx / DC Orders ED Discharge Orders          Ordered    HYDROcodone-acetaminophen (NORCO/VICODIN) 5-325 MG tablet  Every 6 hours PRN        04/10/21 2046             Teressa Lower, PA-C 04/10/21 2051    Arby Barrette, MD 04/11/21 1439

## 2021-04-10 NOTE — ED Triage Notes (Signed)
Mid to lower abdominal pain since this am. Nausea this am. She took Tylenol with no relief. She has never had pain like this in the past.

## 2021-06-14 ENCOUNTER — Emergency Department (HOSPITAL_BASED_OUTPATIENT_CLINIC_OR_DEPARTMENT_OTHER)
Admission: EM | Admit: 2021-06-14 | Discharge: 2021-06-14 | Disposition: A | Discharge status: Home / Self Care | Payer: 59 | Attending: Emergency Medicine | Admitting: Emergency Medicine

## 2021-06-14 ENCOUNTER — Other Ambulatory Visit: Payer: Self-pay

## 2021-06-14 ENCOUNTER — Encounter (HOSPITAL_BASED_OUTPATIENT_CLINIC_OR_DEPARTMENT_OTHER): Payer: Self-pay | Admitting: Emergency Medicine

## 2021-06-14 DIAGNOSIS — I10 Essential (primary) hypertension: Secondary | ICD-10-CM | POA: Diagnosis not present

## 2021-06-14 DIAGNOSIS — R0789 Other chest pain: Secondary | ICD-10-CM | POA: Diagnosis present

## 2021-06-14 DIAGNOSIS — R519 Headache, unspecified: Secondary | ICD-10-CM | POA: Insufficient documentation

## 2021-06-14 DIAGNOSIS — Z79899 Other long term (current) drug therapy: Secondary | ICD-10-CM | POA: Insufficient documentation

## 2021-06-14 DIAGNOSIS — R5383 Other fatigue: Secondary | ICD-10-CM | POA: Diagnosis not present

## 2021-06-14 DIAGNOSIS — J45909 Unspecified asthma, uncomplicated: Secondary | ICD-10-CM | POA: Diagnosis not present

## 2021-06-14 DIAGNOSIS — R6889 Other general symptoms and signs: Secondary | ICD-10-CM

## 2021-06-14 DIAGNOSIS — R079 Chest pain, unspecified: Secondary | ICD-10-CM

## 2021-06-14 LAB — CBC WITH DIFFERENTIAL/PLATELET
Abs Immature Granulocytes: 0 10*3/uL (ref 0.00–0.07)
Basophils Absolute: 0 10*3/uL (ref 0.0–0.1)
Basophils Relative: 0 %
Eosinophils Absolute: 0.1 10*3/uL (ref 0.0–0.5)
Eosinophils Relative: 2 %
HCT: 32.8 % — ABNORMAL LOW (ref 36.0–46.0)
Hemoglobin: 10.1 g/dL — ABNORMAL LOW (ref 12.0–15.0)
Immature Granulocytes: 0 %
Lymphocytes Relative: 62 %
Lymphs Abs: 2 10*3/uL (ref 0.7–4.0)
MCH: 27.3 pg (ref 26.0–34.0)
MCHC: 30.8 g/dL (ref 30.0–36.0)
MCV: 88.6 fL (ref 80.0–100.0)
Monocytes Absolute: 0.3 10*3/uL (ref 0.1–1.0)
Monocytes Relative: 10 %
Neutro Abs: 0.8 10*3/uL — ABNORMAL LOW (ref 1.7–7.7)
Neutrophils Relative %: 26 %
Platelets: 207 10*3/uL (ref 150–400)
RBC: 3.7 MIL/uL — ABNORMAL LOW (ref 3.87–5.11)
RDW: 15.2 % (ref 11.5–15.5)
Smear Review: NORMAL
WBC: 3.3 10*3/uL — ABNORMAL LOW (ref 4.0–10.5)
nRBC: 0 % (ref 0.0–0.2)

## 2021-06-14 LAB — COMPREHENSIVE METABOLIC PANEL
ALT: 6 U/L (ref 0–44)
AST: 12 U/L — ABNORMAL LOW (ref 15–41)
Albumin: 3.4 g/dL — ABNORMAL LOW (ref 3.5–5.0)
Alkaline Phosphatase: 99 U/L (ref 38–126)
Anion gap: 6 (ref 5–15)
BUN: 16 mg/dL (ref 6–20)
CO2: 23 mmol/L (ref 22–32)
Calcium: 8.6 mg/dL — ABNORMAL LOW (ref 8.9–10.3)
Chloride: 111 mmol/L (ref 98–111)
Creatinine, Ser: 0.9 mg/dL (ref 0.44–1.00)
GFR, Estimated: >60 mL/min (ref >60)
Glucose, Bld: 97 mg/dL (ref 70–99)
Potassium: 3.2 mmol/L — ABNORMAL LOW (ref 3.5–5.1)
Sodium: 140 mmol/L (ref 135–145)
Total Bilirubin: 0.3 mg/dL (ref 0.3–1.2)
Total Protein: 7.2 g/dL (ref 6.5–8.1)

## 2021-06-14 LAB — TROPONIN I (HIGH SENSITIVITY): Troponin I (High Sensitivity): <2 ng/L (ref <18)

## 2021-06-14 MED ORDER — OXYCODONE-ACETAMINOPHEN 5-325 MG PO TABS
1.0000 | ORAL_TABLET | Freq: Once | ORAL | Status: AC
  Administered 2021-06-14: 1 via ORAL
  Filled 2021-06-14: qty 1

## 2021-06-14 NOTE — ED Triage Notes (Signed)
Pt presents with "HA all day, high BP, and chest pain", she states her son was just diagnosed with flu, and that her BP at home was 115/83. EKG done at triage. Times adjusted for DST.

## 2021-06-14 NOTE — Discharge Instructions (Signed)
Follow-up with your primary care doctor for recheck early this week.  Recommend Tylenol as needed for pain.  If you develop worsening chest pain, difficulty breathing or other new concerning symptom, come back to ER for reassessment.

## 2021-06-14 NOTE — ED Provider Notes (Signed)
[facility] EMERGENCY DEPARTMENT Provider Note   CSN: [id number] Arrival date & time: 06/14/21  0144     History Chief Complaint  Patient presents with   Chest Pain    Anna [name] is a 50 y.o. female.  Presents to ER with multiple complaints, states that over the last few days she has been feeling generally unwell, over the past day has been having a headache, moderate, frontal, not sudden onset, not worst headache of her life.  Checked her blood pressure at home and it was higher than normal in the 0000000 systolic, normal in [id number].  Also has had some intermittent chest discomfort.  No chest pain at present.  No difficulty in breathing.  Son recently diagnosed with flu.  HPI     Past Medical History:  Diagnosis Date   Anemia    Asthma    Chronic abdominal pain    GERD (gastroesophageal reflux disease)    Hypertension    Insomnia    Migraine    Thyroid disease     Patient Active Problem List   Diagnosis Date Noted   Abdominal pain 04/20/2018   Plantar fasciitis of right foot 07/31/2013   Right ankle pain 01/12/2013   Left knee pain 05/08/2012    Past Surgical History:  Procedure Laterality Date   ANKLE SURGERY     DILATION AND CURETTAGE OF UTERUS     FOOT SURGERY     GASTRIC BYPASS     LAPAROSCOPY ABDOMEN DIAGNOSTIC  04/20/2018   LYSIS OF ADHESION N/A 04/20/2018   Procedure: LAPAROSCOPIC LYSIS OF ADHESION;  Surgeon: Kieth Brightly Arta Bruce, MD;  Location: [facility];  Service: General;  Laterality: N/A;   TUBAL LIGATION     VENTRAL HERNIA REPAIR N/A 04/20/2018   Procedure: DIAGNOSTIC LAPAROSCOPY;  Surgeon: Kieth Brightly Arta Bruce, MD;  Location: [facility];  Service: General;  Laterality: N/A;     OB History   No obstetric history on file.     Family History  Problem Relation Age of Onset   Hypertension Mother    Hypertension Father    Heart attack Father    Diabetes Neg Hx    Hyperlipidemia Neg Hx    Sudden death Neg Hx     Social History    Tobacco Use   Smoking status: Never   Smokeless tobacco: Never  Vaping Use   Vaping Use: Never used  Substance Use Topics   Alcohol use: No   Drug use: No    Home Medications Prior to Admission medications   Medication Sig Start Date End Date Taking? Authorizing Provider  amLODipine (NORVASC) 10 MG tablet  05/04/12   [provider]  calcium citrate-vitamin D (CITRACAL+D) 315-200 MG-UNIT per tablet Take 1 tablet by mouth 2 (two) times daily.    [provider]  cyanocobalamin (,VITAMIN B-12,) 1000 MCG/ML injection Inject 1 mL into the muscle every 30 (thirty) days. 09/05/17   [provider]  Fe Cbn-Fe Gluc-FA-B12-C-DSS (FERRALET 90) 90-1 MG TABS  04/29/12   [provider]  ferrous sulfate 325 (65 FE) MG tablet Take 325 mg by mouth 2 (two) times daily with a meal.     [provider]  HYDROcodone-acetaminophen (NORCO) 5-325 MG tablet Take 2 tablets by mouth every 4 (four) hours as needed for severe pain. 09/19/19   Molpus, John, MD  HYDROcodone-acetaminophen (NORCO/VICODIN) 5-325 MG tablet Take 1 tablet by mouth every 6 (six) hours as needed. 04/10/21   Couture, Cortni S, PA-C  lidocaine (LIDODERM) 5 % PLACE 1 PATCH ON TO THE SKIN DAILY. REMOVE AND DISCARD PATCH WITHIN 12 HOURS OR AS DIRECTED BY MD 06/23/20 06/23/21  Harolyn Rutherford C, PA-C  methocarbamol (ROBAXIN) 750 MG tablet TAKE 1 TABLET BY MOUTH TWICE DAILY AS NEEDED FOR MUSCLE SPASMS OR MUSCLE TIGHTNESS 06/23/20 06/23/21  Joy, Shawn C, PA-C  MULTIPLE VITAMIN PO Take by mouth.    [provider]  pantoprazole (PROTONIX) 40 MG tablet Take 40 mg by mouth daily. 04/19/18   [provider]  polyethylene glycol powder (MIRALAX) powder Take 17 g by mouth daily. Patient taking differently: Take 17 g by mouth as needed for mild constipation.  11/02/17   Palumbo, April, MD  potassium chloride (KLOR-CON) 20 MEQ packet Take 20 mEq by mouth 2 (two) times daily.     [provider]   protein supplement shake (PREMIER PROTEIN) LIQD Take 59.1 mLs (2 oz total) by mouth 4 (four) times daily -  with meals and at bedtime. 04/21/18   Sherrie George, PA-C  Thiamine HCl (VITAMIN B-1) 250 MG tablet Take 250 mg by mouth daily.    [provider]  topiramate (TOPAMAX) 200 MG tablet Take 100 mg by mouth 2 (two) times daily. For migraines    [provider]  zolpidem (AMBIEN CR) 12.5 MG CR tablet Take 12.5 mg by mouth at bedtime as needed for sleep.    [provider]  dicyclomine (BENTYL) 20 MG tablet Take 1 tablet (20 mg total) by mouth 2 (two) times daily. Patient not taking: Reported on 04/20/2018 11/02/17 09/19/19  Palumbo, April, MD  metoCLOPramide (REGLAN) 10 MG tablet Take 1 tablet (10 mg total) by mouth 2 (two) times daily as needed (headache). Patient not taking: Reported on 04/20/2018 01/02/16 09/19/19  Tomasita Crumble, MD  sucralfate (CARAFATE) 1 GM/10ML suspension Take 10 mLs (1 g total) by mouth 4 (four) times daily. Patient not taking: Reported on 04/20/2018 11/22/12 09/19/19  Nicanor Alcon, April, MD    Allergies    Nsaids, Shellfish allergy, and Tamiflu [oseltamivir phosphate]  Review of Systems   Review of Systems  Constitutional:  Positive for fatigue. Negative for chills and fever.  HENT:  Negative for ear pain and sore throat.   Eyes:  Negative for pain and visual disturbance.  Respiratory:  Negative for cough and shortness of breath.   Cardiovascular:  Positive for chest pain. Negative for palpitations.  Gastrointestinal:  Negative for abdominal pain and vomiting.  Genitourinary:  Negative for dysuria and hematuria.  Musculoskeletal:  Negative for arthralgias and back pain.  Skin:  Negative for color change and rash.  Neurological:  Positive for headaches. Negative for seizures and syncope.  All other systems reviewed and are negative.  Physical Exam Updated Vital Signs BP 105/70   Pulse 63   Temp 98.5 F (36.9 C) (Oral)   Resp 18   Ht 5'  6" (1.676 m)   Wt 79.8 kg   SpO2 100%   BMI 28.41 kg/m   Physical Exam Vitals and nursing note reviewed.  Constitutional:      General: She is not in acute distress.    Appearance: She is well-developed.  HENT:     Head: Normocephalic and atraumatic.  Eyes:     Conjunctiva/sclera: Conjunctivae normal.  Cardiovascular:     Rate and Rhythm: Normal rate and regular rhythm.     Heart sounds: No murmur heard. Pulmonary:     Effort: Pulmonary effort is normal. No respiratory distress.  Breath sounds: Normal breath sounds.  Abdominal:     Palpations: Abdomen is soft.     Tenderness: There is no abdominal tenderness.  Musculoskeletal:     Cervical back: Normal range of motion and neck supple. No rigidity or tenderness.  Skin:    General: Skin is warm and dry.  Neurological:     Mental Status: She is alert.     Comments: AAOx3 5/5 strength and sensation in all extremities CN 2-12 intact Normal FNF    ED Results / Procedures / Treatments   Labs (all labs ordered are listed, but only abnormal results are displayed) Labs Reviewed  CBC WITH DIFFERENTIAL/PLATELET - Abnormal; Notable for the following components:      Result Value   WBC 3.3 (*)    RBC 3.70 (*)    Hemoglobin 10.1 (*)    HCT 32.8 (*)    Neutro Abs 0.8 (*)    All other components within normal limits  COMPREHENSIVE METABOLIC PANEL - Abnormal; Notable for the following components:   Potassium 3.2 (*)    Calcium 8.6 (*)    Albumin 3.4 (*)    AST 12 (*)    All other components within normal limits  PATHOLOGIST SMEAR REVIEW  TROPONIN I (HIGH SENSITIVITY)    EKG EKG Interpretation  Date/Time:  Sunday June 14 2021 07:44:14 EST Ventricular Rate:  66 PR Interval:  181 QRS Duration: 88 QT Interval:  412 QTC Calculation: 432 R Axis:   27 Text Interpretation: Sinus rhythm Low voltage, precordial leads Borderline T abnormalities, anterior leads Confirmed by Halil Rentz ([id number]) on 06/14/2021 8:00:13  AM  Radiology No results found.  Procedures Procedures   Medications Ordered in ED Medications  oxyCODONE-acetaminophen (PERCOCET/ROXICET) 5-325 MG per tablet 1 tablet (1 tablet Oral Given 06/14/21 0807)    ED Course  I have reviewed the triage vital signs and the nursing notes.  Pertinent labs & imaging results that were available during my care of the patient were reviewed by me and considered in my medical decision making (see chart for details).    MDM Rules/Calculators/A&P                           50  year old presents to ER with concern for headache, chest pain, high blood pressure.  Here patient appears remarkably well-appearing in no acute distress with normal vital signs, patient has a normal blood pressure.  EKG without any acute ischemic change and her troponin was undetectable, doubt ACS.  Basic labs noted for slight leukopenia.  Suspect patient may have viral illness.  Recommended supportive care, given patient's overall well appearance, believe she is stable for discharge and outpatient management.  Recommended recheck with primary doctor this coming week.  After the discussed management above, the patient was determined to be safe for discharge.  The patient was in agreement with this plan and all questions regarding their care were answered.  ED return precautions were discussed and the patient will return to the ED with any significant worsening of condition.  Final Clinical Impression(s) / ED Diagnoses Final diagnoses:  Flu-like symptoms  Chest pain, unspecified type    Rx / DC Orders ED Discharge Orders     None        Lucrezia Starch, MD 06/15/21 [phone]

## 2021-06-14 NOTE — ED Notes (Signed)
Pt discharged to home. Discharge instructions have been discussed with patient and/or family members. Pt verbally acknowledges understanding d/c instructions, and endorses comprehension to checkout at registration before leaving.  °

## 2021-06-15 LAB — PATHOLOGIST SMEAR REVIEW

## 2021-07-15 ENCOUNTER — Telehealth: Payer: Self-pay | Admitting: Family Medicine

## 2021-07-15 NOTE — Telephone Encounter (Signed)
Patient should be calling to schedule// New patient okd per lowne in chart message Anna [name] [phone]

## 2021-08-21 ENCOUNTER — Encounter: Payer: Self-pay | Admitting: Family Medicine

## 2021-08-21 ENCOUNTER — Ambulatory Visit (INDEPENDENT_AMBULATORY_CARE_PROVIDER_SITE_OTHER): Payer: 59 | Admitting: Family Medicine

## 2021-08-21 VITALS — BP 108/80 | HR 69 | Temp 98.0°F | Resp 18 | Ht 66.0 in | Wt 179.2 lb

## 2021-08-21 DIAGNOSIS — I10 Essential (primary) hypertension: Secondary | ICD-10-CM | POA: Diagnosis not present

## 2021-08-21 DIAGNOSIS — Z0000 Encounter for general adult medical examination without abnormal findings: Secondary | ICD-10-CM

## 2021-08-21 DIAGNOSIS — K219 Gastro-esophageal reflux disease without esophagitis: Secondary | ICD-10-CM | POA: Insufficient documentation

## 2021-08-21 DIAGNOSIS — D509 Iron deficiency anemia, unspecified: Secondary | ICD-10-CM | POA: Diagnosis not present

## 2021-08-21 DIAGNOSIS — Z1159 Encounter for screening for other viral diseases: Secondary | ICD-10-CM

## 2021-08-21 NOTE — Patient Instructions (Addendum)

## 2021-08-21 NOTE — Assessment & Plan Note (Signed)
con't meds Working with [employer]

## 2021-08-21 NOTE — Progress Notes (Addendum)
Subjective:   By signing my name below, I, Anna [name], attest that this documentation has been prepared under the direction and in the presence of Anna Held, DO. 08/21/2021   Patient ID: Anna [name], female    DOB: 1971/05/31, 51 y.o.   MRN: [id number]  Chief Complaint  Patient presents with   New Patient (Initial Visit)    HPI Patient is in today for establishment of patient care.  She had gastric bypass surgery in 2015 and is doing well on it. She still experiences symptoms of GERD and vomiting. She uses 40 mg Protonix and promethazine. She has an endoscopy coming up.  She has a history of anemia and receives infusions for it. Next appointment on January 23.  Her blood pressure is low at this visit. She checks her blood pressure at home and notes they have been low and she has been feeling dizzy.  She has a uterine ablation and did not have her period for about 3 months after that. Notes that she still has leg cramps, back pain and abdominal cramps with her monthly cycles. She used to have heavy periods that lasted for about 7 days.   She denies fever, hearing loss, ear pain,congestion, sinus pain, sore throat, eye pain, chest pain, palpitations, cough, shortness of breath, wheezing, nausea, diarrhea, constipation, blood in stool, dysuria,frequency, hematuria and headaches.   She is UTD on dental and vision care.  She has 2 Covid-19 vaccines at this time. She will receive the flu, tetanus and shingles vaccine at a later time.   She does not drink alcohol. She does not use drugs. She is sexually active.  Past Medical History:  Diagnosis Date   Anemia    Asthma    Chronic abdominal pain    GERD (gastroesophageal reflux disease)    Hypertension    Insomnia    Migraine    Thyroid disease     Past Surgical History:  Procedure Laterality Date   ANKLE SURGERY     DILATION AND CURETTAGE OF UTERUS     FOOT SURGERY     GASTRIC BYPASS     LAPAROSCOPY  ABDOMEN DIAGNOSTIC  04/20/2018   LYSIS OF ADHESION N/A 04/20/2018   Procedure: LAPAROSCOPIC LYSIS OF ADHESION;  Surgeon: Kinsinger, Arta Bruce, MD;  Location: [facility];  Service: General;  Laterality: N/A;   TUBAL LIGATION     VENTRAL HERNIA REPAIR N/A 04/20/2018   Procedure: DIAGNOSTIC LAPAROSCOPY;  Surgeon: Kinsinger, Arta Bruce, MD;  Location: [facility];  Service: General;  Laterality: N/A;    Family History  Problem Relation Age of Onset   Hypertension Mother    Hypertension Father    Heart attack Father    Kidney disease Father    SIDS Sister    Seizures Sister    Heart attack Sister    Dementia Maternal Aunt    Heart attack Maternal Uncle    Diabetes Neg Hx    Hyperlipidemia Neg Hx    Sudden death Neg Hx     Social History   Socioeconomic History   Marital status: Married    Spouse name: Not on file   Number of children: 9   Years of education: Not on file   Highest education level: Not on file  Occupational History   Occupation: substance abuse counselor  Tobacco Use   Smoking status: Never   Smokeless tobacco: Never  Vaping Use   Vaping Use: Never used  Substance and Sexual Activity  Alcohol use: No   Drug use: No   Sexual activity: Yes    Partners: Male    Birth control/protection: Surgical  Other Topics Concern   Not on file  Social History Narrative   Lives with husband and 4 children ---  other 4 are grown    Social Determinants of Health   Financial Resource Strain: Not on file  Food Insecurity: Not on file  Transportation Needs: Not on file  Physical Activity: Not on file  Stress: Not on file  Social Connections: Not on file  Intimate Partner Violence: Not on file    Outpatient Medications Prior to Visit  Medication Sig Dispense Refill   amLODipine (NORVASC) 10 MG tablet      calcium citrate-vitamin D (CITRACAL+D) 315-200 MG-UNIT per tablet Take 1 tablet by mouth 2 (two) times daily.     cyanocobalamin (,VITAMIN B-12,) 1000 MCG/ML injection  Inject 1 mL into the muscle every 30 (thirty) days.     Fe Cbn-Fe Gluc-FA-B12-C-DSS (FERRALET 90) 90-1 MG TABS      ferrous sulfate 325 (65 FE) MG tablet Take 325 mg by mouth 2 (two) times daily with a meal.      HYDROcodone-acetaminophen (NORCO) 5-325 MG tablet Take 2 tablets by mouth every 4 (four) hours as needed for severe pain. 20 tablet 0   HYDROcodone-acetaminophen (NORCO/VICODIN) 5-325 MG tablet Take 1 tablet by mouth every 6 (six) hours as needed. 6 tablet 0   MULTIPLE VITAMIN PO Take by mouth.     pantoprazole (PROTONIX) 40 MG tablet Take 40 mg by mouth daily.     polyethylene glycol powder (MIRALAX) powder Take 17 g by mouth daily. (Patient taking differently: Take 17 g by mouth as needed for mild constipation.) 255 g 0   potassium chloride (KLOR-CON) 20 MEQ packet Take 20 mEq by mouth 2 (two) times daily.      protein supplement shake (PREMIER PROTEIN) LIQD Take 59.1 mLs (2 oz total) by mouth 4 (four) times daily -  with meals and at bedtime.  0   Thiamine HCl (VITAMIN B-1) 250 MG tablet Take 250 mg by mouth daily.     topiramate (TOPAMAX) 200 MG tablet Take 100 mg by mouth 2 (two) times daily. For migraines     zolpidem (AMBIEN CR) 12.5 MG CR tablet Take 12.5 mg by mouth at bedtime as needed for sleep.     No facility-administered medications prior to visit.    Allergies  Allergen Reactions   Nsaids Other (See Comments)    Unable to take due to gastric bypass   Shellfish Allergy     Positive allergy test   Tamiflu [Oseltamivir Phosphate] Hives    Review of Systems  Constitutional:  Negative for fever.  HENT:  Negative for congestion, ear pain, hearing loss, sinus pain and sore throat.   Eyes:  Negative for blurred vision and pain.  Respiratory:  Negative for cough, sputum production, shortness of breath and wheezing.   Cardiovascular:  Negative for chest pain and palpitations.  Gastrointestinal:  Negative for blood in stool, constipation, diarrhea, nausea and vomiting.   Genitourinary:  Negative for dysuria, frequency, hematuria and urgency.  Musculoskeletal:  Negative for back pain, falls and myalgias.  Neurological:  Negative for dizziness, sensory change, loss of consciousness, weakness and headaches.  Endo/Heme/Allergies:  Negative for environmental allergies. Does not bruise/bleed easily.  Psychiatric/Behavioral:  Negative for depression and suicidal ideas. The patient is not nervous/anxious and does not have insomnia.  Objective:    Physical Exam Constitutional:      General: She is not in acute distress.    Appearance: Normal appearance. She is not ill-appearing.  HENT:     Head: Normocephalic and atraumatic.     Right Ear: Tympanic membrane, ear canal and external ear normal.     Left Ear: Tympanic membrane, ear canal and external ear normal.  Eyes:     Extraocular Movements: Extraocular movements intact.     Pupils: Pupils are equal, round, and reactive to light.  Cardiovascular:     Rate and Rhythm: Normal rate and regular rhythm.     Pulses: Normal pulses.     Heart sounds: Normal heart sounds. No murmur heard.   No gallop.  Pulmonary:     Effort: Pulmonary effort is normal. No respiratory distress.     Breath sounds: Normal breath sounds. No wheezing, rhonchi or rales.  Abdominal:     General: Bowel sounds are normal. There is no distension.     Palpations: Abdomen is soft. There is no mass.     Tenderness: There is no abdominal tenderness. There is no guarding or rebound.     Hernia: No hernia is present.  Musculoskeletal:     Cervical back: Normal range of motion and neck supple.  Lymphadenopathy:     Cervical: No cervical adenopathy.  Skin:    General: Skin is warm and dry.  Neurological:     Mental Status: She is alert and oriented to person, place, and time.  Psychiatric:        Behavior: Behavior normal.  Pt sees a gyn   BP 108/80 (BP Location: Left Arm, Patient Position: Sitting, Cuff Size: Normal)    Pulse 69     Temp 98 F (36.7 C) (Oral)    Resp 18    Ht 5\' 6"  (1.676 m)    Wt 179 lb 3.2 oz (81.3 kg)    SpO2 99%    BMI 28.92 kg/m  Wt Readings from Last 3 Encounters:  08/21/21 179 lb 3.2 oz (81.3 kg)  06/14/21 176 lb (79.8 kg)  04/10/21 190 lb 0.6 oz (86.2 kg)    Diabetic Foot Exam - Simple   No data filed    Lab Results  Component Value Date   WBC 3.3 (L) 06/14/2021   HGB 10.1 (L) 06/14/2021   HCT 32.8 (L) 06/14/2021   PLT 207 06/14/2021   GLUCOSE 97 06/14/2021   ALT 6 06/14/2021   AST 12 (L) 06/14/2021   NA 140 06/14/2021   K 3.2 (L) 06/14/2021   CL 111 06/14/2021   CREATININE 0.90 06/14/2021   BUN 16 06/14/2021   CO2 23 06/14/2021    No results found for: TSH Lab Results  Component Value Date   WBC 3.3 (L) 06/14/2021   HGB 10.1 (L) 06/14/2021   HCT 32.8 (L) 06/14/2021   MCV 88.6 06/14/2021   PLT 207 06/14/2021   Lab Results  Component Value Date   NA 140 06/14/2021   K 3.2 (L) 06/14/2021   CO2 23 06/14/2021   GLUCOSE 97 06/14/2021   BUN 16 06/14/2021   CREATININE 0.90 06/14/2021   BILITOT 0.3 06/14/2021   ALKPHOS 99 06/14/2021   AST 12 (L) 06/14/2021   ALT 6 06/14/2021   PROT 7.2 06/14/2021   ALBUMIN 3.4 (L) 06/14/2021   CALCIUM 8.6 (L) 06/14/2021   ANIONGAP 6 06/14/2021   No results found for: CHOL No results found for: HDL No  results found for: LDLCALC No results found for: TRIG No results found for: CHOLHDL No results found for: HGBA1C     Assessment & Plan:   Problem List Items Addressed This Visit       Unprioritized   Gastroesophageal reflux disease    con't meds Working with gi       Relevant Orders   Lipid panel   TSH   Iron deficiency anemia    Check labs today Pt has iron transfusion scheduled for 1/23       Preventative health care - Primary    ghm --- pt will wait for vaccines until she gets back from vacation  See avs Check labs       Primary hypertension    Well controlled, no changes to meds. Encouraged heart healthy  diet such as the DASH diet and exercise as tolerated.       Relevant Orders   Lipid panel   TSH   Other Visit Diagnoses     Need for hepatitis C screening test       Relevant Orders   Hepatitis C antibody        No orders of the defined types were placed in this encounter.   I,Anna [name],acting as a Education administrator for [employer], DO.,have documented all relevant documentation on the behalf of Anna Held, DO,as directed by  Anna Held, DO while in the presence of Anna [name], [place], DO., personally preformed the services described in this documentation.  All medical record entries made by the scribe were at my direction and in my presence.  I have reviewed the chart and discharge instructions (if applicable) and agree that the record reflects my personal performance and is accurate and complete. 08/21/2021

## 2021-08-21 NOTE — Assessment & Plan Note (Signed)
ghm --- pt will wait for vaccines until she gets back from vacation  See avs Check labs

## 2021-08-21 NOTE — Assessment & Plan Note (Signed)
Check labs today Pt has iron transfusion scheduled for 1/23

## 2021-08-21 NOTE — Assessment & Plan Note (Signed)
Well controlled, no changes to meds. Encouraged heart healthy diet such as the DASH diet and exercise as tolerated.  °

## 2021-08-23 ENCOUNTER — Other Ambulatory Visit: Payer: Self-pay | Admitting: Family Medicine

## 2021-08-23 DIAGNOSIS — R7989 Other specified abnormal findings of blood chemistry: Secondary | ICD-10-CM

## 2021-08-24 ENCOUNTER — Encounter: Payer: Self-pay | Admitting: Family Medicine

## 2021-08-24 LAB — LIPID PANEL
Cholesterol: 159 mg/dL (ref <200)
HDL: 69 mg/dL (ref >=50)
LDL Cholesterol (Calc): 76 mg/dL (calc)
Non-HDL Cholesterol (Calc): 90 mg/dL (calc) (ref <130)
Total CHOL/HDL Ratio: 2.3 (calc) (ref <5.0)
Triglycerides: 65 mg/dL (ref <150)

## 2021-08-24 LAB — HEPATITIS C ANTIBODY
Hepatitis C Ab: NONREACTIVE
SIGNAL TO CUT-OFF: 0.18 (ref <1.00)

## 2021-08-24 LAB — TSH: TSH: 0.33 mIU/L — ABNORMAL LOW

## 2021-08-25 NOTE — Telephone Encounter (Signed)
Message is in response to labs. Please advise

## 2021-09-01 ENCOUNTER — Encounter: Payer: Self-pay | Admitting: Family Medicine

## 2021-09-01 ENCOUNTER — Other Ambulatory Visit: Payer: Self-pay | Admitting: Family Medicine

## 2021-09-02 MED ORDER — PANTOPRAZOLE SODIUM 40 MG PO TBEC: 40.0000 mg | DELAYED_RELEASE_TABLET | Freq: Every day | ORAL | 1 refills | Status: AC

## 2021-09-02 MED ORDER — TOPIRAMATE 200 MG PO TABS: 100.0000 mg | ORAL_TABLET | Freq: Two times a day (BID) | ORAL | 1 refills | Status: AC

## 2021-09-02 MED ORDER — ZOLPIDEM TARTRATE ER 12.5 MG PO TBCR: 12.5000 mg | EXTENDED_RELEASE_TABLET | Freq: Every evening | ORAL | 0 refills | Status: DC | PRN

## 2021-09-02 MED ORDER — AMLODIPINE BESYLATE 10 MG PO TABS: 10.0000 mg | ORAL_TABLET | Freq: Every day | ORAL | 1 refills | Status: AC

## 2021-09-02 NOTE — Telephone Encounter (Signed)
Requesting: Ambien CR 12.5mg , looks like it may be a new medication for you?   Please Advise

## 2021-09-14 ENCOUNTER — Encounter: Payer: Self-pay | Admitting: Family Medicine

## 2021-10-05 ENCOUNTER — Emergency Department (HOSPITAL_BASED_OUTPATIENT_CLINIC_OR_DEPARTMENT_OTHER): Payer: 59

## 2021-10-05 ENCOUNTER — Other Ambulatory Visit: Payer: Self-pay | Admitting: Family Medicine

## 2021-10-05 ENCOUNTER — Emergency Department (HOSPITAL_BASED_OUTPATIENT_CLINIC_OR_DEPARTMENT_OTHER)
Admission: EM | Admit: 2021-10-05 | Discharge: 2021-10-05 | Disposition: A | Discharge status: Home / Self Care | Payer: 59 | Attending: Emergency Medicine | Admitting: Emergency Medicine

## 2021-10-05 ENCOUNTER — Encounter (HOSPITAL_BASED_OUTPATIENT_CLINIC_OR_DEPARTMENT_OTHER): Payer: Self-pay

## 2021-10-05 ENCOUNTER — Other Ambulatory Visit: Payer: Self-pay

## 2021-10-05 DIAGNOSIS — R110 Nausea: Secondary | ICD-10-CM | POA: Diagnosis not present

## 2021-10-05 DIAGNOSIS — R1032 Left lower quadrant pain: Secondary | ICD-10-CM | POA: Diagnosis present

## 2021-10-05 DIAGNOSIS — R1084 Generalized abdominal pain: Secondary | ICD-10-CM | POA: Diagnosis not present

## 2021-10-05 LAB — CBC
HCT: 34.6 % — ABNORMAL LOW (ref 36.0–46.0)
Hemoglobin: 11 g/dL — ABNORMAL LOW (ref 12.0–15.0)
MCH: 27.4 pg (ref 26.0–34.0)
MCHC: 31.8 g/dL (ref 30.0–36.0)
MCV: 86.1 fL (ref 80.0–100.0)
Platelets: 263 10*3/uL (ref 150–400)
RBC: 4.02 MIL/uL (ref 3.87–5.11)
RDW: 14.8 % (ref 11.5–15.5)
WBC: 9.8 10*3/uL (ref 4.0–10.5)
nRBC: 0 % (ref 0.0–0.2)

## 2021-10-05 LAB — COMPREHENSIVE METABOLIC PANEL
ALT: 7 U/L (ref 0–44)
AST: 14 U/L — ABNORMAL LOW (ref 15–41)
Albumin: 3.4 g/dL — ABNORMAL LOW (ref 3.5–5.0)
Alkaline Phosphatase: 91 U/L (ref 38–126)
Anion gap: 6 (ref 5–15)
BUN: 19 mg/dL (ref 6–20)
CO2: 22 mmol/L (ref 22–32)
Calcium: 8.6 mg/dL — ABNORMAL LOW (ref 8.9–10.3)
Chloride: 109 mmol/L (ref 98–111)
Creatinine, Ser: 0.83 mg/dL (ref 0.44–1.00)
GFR, Estimated: >60 mL/min (ref >60)
Glucose, Bld: 115 mg/dL — ABNORMAL HIGH (ref 70–99)
Potassium: 3.4 mmol/L — ABNORMAL LOW (ref 3.5–5.1)
Sodium: 137 mmol/L (ref 135–145)
Total Bilirubin: 0.6 mg/dL (ref 0.3–1.2)
Total Protein: 7.8 g/dL (ref 6.5–8.1)

## 2021-10-05 LAB — URINALYSIS, ROUTINE W REFLEX MICROSCOPIC
Bilirubin Urine: NEGATIVE
Glucose, UA: NEGATIVE mg/dL
Ketones, ur: NEGATIVE mg/dL
Leukocytes,Ua: NEGATIVE
Nitrite: NEGATIVE
Protein, ur: NEGATIVE mg/dL
Specific Gravity, Urine: <1.005 — ABNORMAL LOW (ref 1.005–1.030)
pH: 6 (ref 5.0–8.0)

## 2021-10-05 LAB — PREGNANCY, URINE: Preg Test, Ur: NEGATIVE

## 2021-10-05 LAB — LIPASE, BLOOD: Lipase: 27 U/L (ref 11–51)

## 2021-10-05 LAB — URINALYSIS, MICROSCOPIC (REFLEX)

## 2021-10-05 MED ORDER — DICYCLOMINE HCL 20 MG PO TABS: 20.0000 mg | ORAL_TABLET | Freq: Two times a day (BID) | ORAL | 0 refills | Status: AC

## 2021-10-05 MED ORDER — MORPHINE SULFATE (PF) 4 MG/ML IV SOLN
4.0000 mg | Freq: Once | INTRAVENOUS | Status: AC
  Administered 2021-10-05: 4 mg via INTRAVENOUS
  Filled 2021-10-05: qty 1

## 2021-10-05 MED ORDER — POTASSIUM CHLORIDE CRYS ER 20 MEQ PO TBCR
40.0000 meq | EXTENDED_RELEASE_TABLET | Freq: Once | ORAL | Status: AC
Start: 2021-10-05 — End: 2021-10-05
  Administered 2021-10-05: 40 meq via ORAL
  Filled 2021-10-05: qty 2

## 2021-10-05 MED ORDER — ONDANSETRON 4 MG PO TBDP: 4.0000 mg | ORAL_TABLET | Freq: Three times a day (TID) | ORAL | 0 refills | Status: DC | PRN

## 2021-10-05 MED ORDER — IOHEXOL 300 MG/ML  SOLN
100.0000 mL | Freq: Once | INTRAMUSCULAR | Status: AC | PRN
  Administered 2021-10-05: 100 mL via INTRAVENOUS

## 2021-10-05 MED ORDER — ONDANSETRON HCL 4 MG/2ML IJ SOLN
4.0000 mg | Freq: Once | INTRAMUSCULAR | Status: AC
  Administered 2021-10-05: 4 mg via INTRAVENOUS
  Filled 2021-10-05: qty 2

## 2021-10-05 MED ORDER — SODIUM CHLORIDE 0.9 % IV BOLUS
1000.0000 mL | Freq: Once | INTRAVENOUS | Status: AC
  Administered 2021-10-05: 1000 mL via INTRAVENOUS

## 2021-10-05 NOTE — ED Provider Notes (Signed)
[facility] Provider Note   CSN: [id number] Arrival date & time: 10/05/21  1621     History  Chief Complaint  Patient presents with   Abdominal Pain    Anna [name] is a 51 y.o. female.   Abdominal Pain  Patient is a 51 year old female with past surgical history notable for tubal ligation, gastric bypass in [id number] with some complications from stomach ulcers, tummy tuck, C-section  She presents emergency room today with complaints of left lower abdominal pain ongoing for the past 1 day.  She states her symptoms started this morning around 10:30 AM she has some nausea without vomiting she denies any diarrhea fevers cough congestion denies any urinary frequency urgency dysuria hematuria no lightheadedness or dizziness she states the pain is crampy achy and worse when she pushes on her belly.      Home Medications Prior to Admission medications   Medication Sig Start Date End Date Taking? Authorizing Provider  dicyclomine (BENTYL) 20 MG tablet Take 1 tablet (20 mg total) by mouth 2 (two) times daily. 10/05/21  Yes Johanan Skorupski S, PA  ondansetron (ZOFRAN-ODT) 4 MG disintegrating tablet Take 1 tablet (4 mg total) by mouth every 8 (eight) hours as needed for nausea or vomiting. 10/05/21  Yes Rylan Kaufmann S, PA  amLODipine (NORVASC) 10 MG tablet Take 1 tablet (10 mg total) by mouth daily. 09/02/21   [name], DO  calcium citrate-vitamin D (CITRACAL+D) 315-200 MG-UNIT per tablet Take 1 tablet by mouth 2 (two) times daily.    [provider]  cyanocobalamin (,VITAMIN B-12,) 1000 MCG/ML injection Inject 1 mL into the muscle every 30 (thirty) days. 09/05/17   [provider]  Fe Cbn-Fe Gluc-FA-B12-C-DSS (FERRALET 90) 90-1 MG TABS  04/29/12   [provider]  ferrous sulfate 325 (65 FE) MG tablet Take 325 mg by mouth 2 (two) times daily with a meal.     [provider]  HYDROcodone-acetaminophen (NORCO)  5-325 MG tablet Take 2 tablets by mouth every 4 (four) hours as needed for severe pain. 09/19/19   Molpus, John, MD  HYDROcodone-acetaminophen (NORCO/VICODIN) 5-325 MG tablet Take 1 tablet by mouth every 6 (six) hours as needed. 04/10/21   Couture, Cortni S, PA-C  MULTIPLE VITAMIN PO Take by mouth.    [provider]  pantoprazole (PROTONIX) 40 MG tablet Take 1 tablet (40 mg total) by mouth daily. 09/02/21   Roma Schanz R, DO  polyethylene glycol powder (MIRALAX) powder Take 17 g by mouth daily. Patient taking differently: Take 17 g by mouth as needed for mild constipation. 11/02/17   Palumbo, April, MD  potassium chloride (KLOR-CON) 20 MEQ packet Take 20 mEq by mouth 2 (two) times daily.     [provider]  protein supplement shake (PREMIER PROTEIN) LIQD Take 59.1 mLs (2 oz total) by mouth 4 (four) times daily -  with meals and at bedtime. 04/21/18   Earnstine Regal, PA-C  Thiamine HCl (VITAMIN B-1) 250 MG tablet Take 250 mg by mouth daily.    [provider]  topiramate (TOPAMAX) 200 MG tablet Take 0.5 tablets (100 mg total) by mouth 2 (two) times daily. For migraines 09/02/21   Carollee Herter, Kendrick Fries R, DO  zolpidem (AMBIEN CR) 12.5 MG CR tablet TAKE 1 TABLET BY MOUTH AT BEDTIME AS NEEDED FOR SLEEP 10/05/21   Carollee Herter, Alferd Apa, DO  metoCLOPramide (REGLAN) 10 MG tablet Take 1 tablet (10 mg total) by mouth 2 (  two) times daily as needed (headache). Patient not taking: Reported on 04/20/2018 01/02/16 09/19/19  Everlene Balls, MD  sucralfate (CARAFATE) 1 GM/10ML suspension Take 10 mLs (1 g total) by mouth 4 (four) times daily. Patient not taking: Reported on 04/20/2018 11/22/12 09/19/19  Randal Buba, April, MD      Allergies    Nsaids, Shellfish allergy, and Tamiflu [oseltamivir phosphate]    Review of Systems   Review of Systems  Gastrointestinal:  Positive for abdominal pain.   Physical Exam Updated Vital Signs BP 90/64    Pulse 67    Temp 98 F (36.7 C) (Oral)    Resp  16    Ht 5\' 6"  (1.676 m)    Wt 80.3 kg    SpO2 98%    BMI 28.57 kg/m  Physical Exam Vitals and nursing note reviewed.  Constitutional:      General: She is not in acute distress.    Comments: Pleasant nontoxic appearing 51 year old female  HENT:     Head: Normocephalic and atraumatic.     Nose: Nose normal.  Eyes:     General: No scleral icterus. Cardiovascular:     Rate and Rhythm: Normal rate and regular rhythm.     Pulses: Normal pulses.     Heart sounds: Normal heart sounds.  Pulmonary:     Effort: Pulmonary effort is normal. No respiratory distress.     Breath sounds: No wheezing.  Abdominal:     Palpations: Abdomen is soft.     Tenderness: There is abdominal tenderness in the left lower quadrant.  Musculoskeletal:     Cervical back: Normal range of motion.     Right lower leg: No edema.     Left lower leg: No edema.  Skin:    General: Skin is warm and dry.     Capillary Refill: Capillary refill takes less than 2 seconds.  Neurological:     Mental Status: She is alert. Mental status is at baseline.  Psychiatric:        Mood and Affect: Mood normal.        Behavior: Behavior normal.    ED Results / Procedures / Treatments   Labs (all labs ordered are listed, but only abnormal results are displayed) Labs Reviewed  COMPREHENSIVE METABOLIC PANEL - Abnormal; Notable for the following components:      Result Value   Potassium 3.4 (*)    Glucose, Bld 115 (*)    Calcium 8.6 (*)    Albumin 3.4 (*)    AST 14 (*)    All other components within normal limits  CBC - Abnormal; Notable for the following components:   Hemoglobin 11.0 (*)    HCT 34.6 (*)    All other components within normal limits  URINALYSIS, ROUTINE W REFLEX MICROSCOPIC - Abnormal; Notable for the following components:   Specific Gravity, Urine <1.005 (*)    Hgb urine dipstick TRACE (*)    All other components within normal limits  URINALYSIS, MICROSCOPIC (REFLEX) - Abnormal; Notable for the following  components:   Bacteria, UA RARE (*)    All other components within normal limits  LIPASE, BLOOD  PREGNANCY, URINE    EKG None  Radiology CT ABDOMEN PELVIS W CONTRAST  Result Date: 10/05/2021 CLINICAL DATA:  Left lower quadrant abdominal pain. EXAM: CT ABDOMEN AND PELVIS WITH CONTRAST TECHNIQUE: Multidetector CT imaging of the abdomen and pelvis was performed using the standard protocol following bolus administration of intravenous contrast. RADIATION DOSE REDUCTION: This  exam was performed according to the departmental dose-optimization program which includes automated exposure control, adjustment of the mA and/or kV according to patient size and/or use of iterative reconstruction technique. CONTRAST:  159mL OMNIPAQUE IOHEXOL 300 MG/ML  SOLN COMPARISON:  April 10, 2021. FINDINGS: Lower chest: No acute abnormality. Hepatobiliary: No focal liver abnormality is seen. No gallstones, gallbladder wall thickening, or biliary dilatation. Pancreas: Unremarkable. No pancreatic ductal dilatation or surrounding inflammatory changes. Spleen: Normal in size without focal abnormality. Adrenals/Urinary Tract: Adrenal glands are unremarkable. Kidneys are normal, without renal calculi, focal lesion, or hydronephrosis. Bladder is unremarkable. Stomach/Bowel: Status post gastric bypass. There is no evidence of bowel obstruction or inflammation. The appendix appears normal. Vascular/Lymphatic: Aortic atherosclerosis. No enlarged abdominal or pelvic lymph nodes. Reproductive: Grossly stable 2.6 cm uterine fibroid. No adnexal abnormality is noted. Other: No abdominal wall hernia or abnormality. No abdominopelvic ascites. Musculoskeletal: No acute or significant osseous findings. IMPRESSION: Stable uterine fibroid. No acute abnormality seen in the abdomen or pelvis. Electronically Signed   By: Marijo Conception M.D.   On: 10/05/2021 19:20    Procedures Procedures    Medications Ordered in ED Medications  ondansetron  (ZOFRAN) injection 4 mg (4 mg Intravenous Given 10/05/21 1728)  morphine (PF) 4 MG/ML injection 4 mg (4 mg Intravenous Given 10/05/21 1945)  sodium chloride 0.9 % bolus 1,000 mL (0 mLs Intravenous Stopped 10/05/21 2059)  potassium chloride SA (KLOR-CON M) CR tablet 40 mEq (40 mEq Oral Given 10/05/21 1944)  iohexol (OMNIPAQUE) 300 MG/ML solution 100 mL (100 mLs Intravenous Contrast Given 10/05/21 1856)  morphine (PF) 4 MG/ML injection 4 mg (4 mg Intravenous Given 10/05/21 2059)    ED Course/ Medical Decision Making/ A&P Clinical Course as of 10/05/21 2353  Mon Oct 05, 2021  1842 Gastric Bipass, c section, tummy tuck. Tubal ligation. Bipass 2015 and had some ulcers after.     10:30am nausea without vomiting. No diarrhea, fever, cough, congestion. No urinary sx.  [WF]  1931 IMPRESSION: Stable uterine fibroid.   No acute abnormality seen in the abdomen or pelvis.     [WF]  1931 I personally reviewed CT abdomen pelvis with contrast I agree of radiology read there is no acute abnormality that I am able to appreciate on CT imaging [WF]  1931 And waiting for patient to receive morphine and potassium IV fluids. [WF]    Clinical Course User Index [WF] Tedd Sias, [place]                           Medical Decision Making Amount and/or Complexity of Data Reviewed Labs: ordered. Radiology: ordered.  Risk Prescription drug management.   This patient presents to the ED for concern of abdominal pain, this involves a number of treatment options, and is a complaint that carries with it a high risk of complications and morbidity.  The differential diagnosis includes The causes of generalized abdominal pain include but are not limited to AAA, mesenteric ischemia, appendicitis, diverticulitis, DKA, gastritis, gastroenteritis, AMI, nephrolithiasis, pancreatitis, peritonitis, adrenal insufficiency,lead poisoning, iron toxicity, intestinal ischemia, constipation, UTI,SBO/LBO, splenic rupture, biliary  disease, IBD, IBS, PUD, or hepatitis. Ectopic pregnancy, ovarian torsion, PID.    Co morbidities: Discussed in HPI   Brief History:  Abdominal pain since 1030 this morning history of gastric bypass.  Tender left lower quadrant.  Otherwise well-appearing on exam.  Will obtain CT abdomen pelvis  EMR reviewed including pt PMHx, past surgical history and past  visits to ER.   See HPI for more details   Lab Tests:  I ordered and independently interpreted labs.  The pertinent results include:    Labs notable for marginal hypokalemia 3.4.  Will need to have this rechecked.  CBC without leukocytosis or clinically significant anemia pregnancy test negative.  Lipase within normal limits urinalysis without significant abnormality apart from trace hemoglobin.   Imaging Studies:  NAD. I personally reviewed all imaging studies and no acute abnormality found. I agree with radiology interpretation. Agree with radiology read status post gastric bypass no other acute abnormalities.  She is already aware of fibroid.   Cardiac Monitoring:  NA NA   Medicines ordered:  I ordered medication including morphine x2, K chlor, Zofran, IV fluids for hypokalemia, pain, nausea Reevaluation of the patient after these medicines showed that the patient resolved I have reviewed the patients home medicines and have made adjustments as needed   Critical Interventions:     Consults:      Reevaluation:  After the interventions noted above I re-evaluated patient and found that they have :resolved   Social Determinants of Health:  The patient's social determinants of health were a factor in the care of this patient    Problem List / ED Course:  Abdominal pain  Completely resolved after 2 doses of morphine.  No longer significantly tender.  Recommend follow-up with her general surgeon and PCP.  Return precautions were given.  CT abdomen pelvis and lab work-up  reassuring.   Dispostion:  After consideration of the diagnostic results and the patients response to treatment, I feel that the patent would benefit from discharge home with close follow-up with primary care and general surgery team.  Bentyl Zofran prescription printed and given to patient at time of discharge.    Final Clinical Impression(s) / ED Diagnoses Final diagnoses:  Generalized abdominal pain    Rx / DC Orders ED Discharge Orders          Ordered    dicyclomine (BENTYL) 20 MG tablet  2 times daily        10/05/21 2155    ondansetron (ZOFRAN-ODT) 4 MG disintegrating tablet  Every 8 hours PRN        10/05/21 2155              Tedd Sias, [place] 10/05/21 2355    Lucrezia Starch, MD 10/10/21 [phone]

## 2021-10-05 NOTE — ED Triage Notes (Signed)
Pt c/o lower abd pain, nausea started ~1030am-NAD-steady gait

## 2021-10-05 NOTE — ED Notes (Signed)
Pt A&OX4 ambulatory at d/c with independent steady gait,  NAD. Pt verbalized understanding of d/c instructions, prescriptions and follow up care. Pt reports she feels "so much better."

## 2021-10-05 NOTE — Telephone Encounter (Signed)
Requesting: zolpidem Contract: none UDS: none Last Visit: 08/21/21  Next Visit: none Last Refill: 09/02/21  Please Advise

## 2021-10-05 NOTE — Discharge Instructions (Addendum)
Please follow-up with your primary care doctor and your surgeon.  I am prescribing you Zofran to use for nausea as needed and a medicine called Bentyl to take for pain.  Drink plenty of water keep close eye on her symptoms return to the ER for any new or concerning symptoms.

## 2022-04-23 ENCOUNTER — Encounter (HOSPITAL_BASED_OUTPATIENT_CLINIC_OR_DEPARTMENT_OTHER): Payer: Self-pay | Admitting: *Deleted

## 2022-04-23 ENCOUNTER — Emergency Department (HOSPITAL_BASED_OUTPATIENT_CLINIC_OR_DEPARTMENT_OTHER)
Admission: EM | Admit: 2022-04-23 | Discharge: 2022-04-24 | Disposition: A | Discharge status: Home / Self Care | Payer: Medicaid Other | Attending: Emergency Medicine | Admitting: Emergency Medicine

## 2022-04-23 ENCOUNTER — Other Ambulatory Visit: Payer: Self-pay

## 2022-04-23 DIAGNOSIS — Z79899 Other long term (current) drug therapy: Secondary | ICD-10-CM | POA: Insufficient documentation

## 2022-04-23 DIAGNOSIS — R197 Diarrhea, unspecified: Secondary | ICD-10-CM | POA: Insufficient documentation

## 2022-04-23 DIAGNOSIS — R110 Nausea: Secondary | ICD-10-CM | POA: Insufficient documentation

## 2022-04-23 DIAGNOSIS — J45909 Unspecified asthma, uncomplicated: Secondary | ICD-10-CM | POA: Insufficient documentation

## 2022-04-23 DIAGNOSIS — I10 Essential (primary) hypertension: Secondary | ICD-10-CM | POA: Diagnosis not present

## 2022-04-23 DIAGNOSIS — N83202 Unspecified ovarian cyst, left side: Secondary | ICD-10-CM | POA: Insufficient documentation

## 2022-04-23 DIAGNOSIS — R109 Unspecified abdominal pain: Secondary | ICD-10-CM | POA: Diagnosis present

## 2022-04-23 LAB — CBC
HCT: 35.9 % — ABNORMAL LOW (ref 36.0–46.0)
Hemoglobin: 12 g/dL (ref 12.0–15.0)
MCH: 29.5 pg (ref 26.0–34.0)
MCHC: 33.4 g/dL (ref 30.0–36.0)
MCV: 88.2 fL (ref 80.0–100.0)
Platelets: 295 10*3/uL (ref 150–400)
RBC: 4.07 MIL/uL (ref 3.87–5.11)
RDW: 15.4 % (ref 11.5–15.5)
WBC: 7.3 10*3/uL (ref 4.0–10.5)
nRBC: 0 % (ref 0.0–0.2)

## 2022-04-23 LAB — COMPREHENSIVE METABOLIC PANEL
ALT: 6 U/L (ref 0–44)
AST: 13 U/L — ABNORMAL LOW (ref 15–41)
Albumin: 3.1 g/dL — ABNORMAL LOW (ref 3.5–5.0)
Alkaline Phosphatase: 87 U/L (ref 38–126)
Anion gap: 5 (ref 5–15)
BUN: 24 mg/dL — ABNORMAL HIGH (ref 6–20)
CO2: 18 mmol/L — ABNORMAL LOW (ref 22–32)
Calcium: 8.2 mg/dL — ABNORMAL LOW (ref 8.9–10.3)
Chloride: 113 mmol/L — ABNORMAL HIGH (ref 98–111)
Creatinine, Ser: 0.69 mg/dL (ref 0.44–1.00)
GFR, Estimated: >60 mL/min (ref >60)
Glucose, Bld: 106 mg/dL — ABNORMAL HIGH (ref 70–99)
Potassium: 3.5 mmol/L (ref 3.5–5.1)
Sodium: 136 mmol/L (ref 135–145)
Total Bilirubin: 0.8 mg/dL (ref 0.3–1.2)
Total Protein: 6.6 g/dL (ref 6.5–8.1)

## 2022-04-23 LAB — URINALYSIS, MICROSCOPIC (REFLEX)

## 2022-04-23 LAB — URINALYSIS, ROUTINE W REFLEX MICROSCOPIC
Bilirubin Urine: NEGATIVE
Glucose, UA: NEGATIVE mg/dL
Ketones, ur: 40 mg/dL — AB
Leukocytes,Ua: NEGATIVE
Nitrite: NEGATIVE
Protein, ur: NEGATIVE mg/dL
Specific Gravity, Urine: >=1.03 (ref 1.005–1.030)
pH: 5.5 (ref 5.0–8.0)

## 2022-04-23 LAB — PREGNANCY, URINE: Preg Test, Ur: NEGATIVE

## 2022-04-23 LAB — LIPASE, BLOOD: Lipase: 29 U/L (ref 11–51)

## 2022-04-23 MED ORDER — ONDANSETRON HCL 4 MG/2ML IJ SOLN
4.0000 mg | Freq: Once | INTRAMUSCULAR | Status: AC
Start: 2022-04-23 — End: 2022-04-24
  Administered 2022-04-24: 4 mg via INTRAVENOUS
  Filled 2022-04-23: qty 2

## 2022-04-23 MED ORDER — SODIUM CHLORIDE 0.9 % IV BOLUS
1000.0000 mL | Freq: Once | INTRAVENOUS | Status: AC
  Administered 2022-04-24: 1000 mL via INTRAVENOUS

## 2022-04-23 MED ORDER — FENTANYL CITRATE PF 50 MCG/ML IJ SOSY
50.0000 ug | PREFILLED_SYRINGE | Freq: Once | INTRAMUSCULAR | Status: AC
  Administered 2022-04-24: 50 ug via INTRAVENOUS
  Filled 2022-04-23: qty 1

## 2022-04-23 NOTE — ED Provider Notes (Signed)
[facility] Provider Note: Anna Dell, MD, FACEP  CSN: [id number] MRN: [id number] ARRIVAL: 04/23/22 at 2148 ROOM: [facility]05/[facility]05   CHIEF COMPLAINT  Abdominal Pain   HISTORY OF PRESENT ILLNESS  04/23/22 11:17 PM Anna [name] is a 51 y.o. female with a history of gastric bypass in 2015.  She is here with abdominal pain present throughout the day.  The pain is located primarily in the epigastrium and suprapubic areas.  She rates it as an 8 out of 10.  It is somewhat worse with palpation or movement.  She has had nausea without vomiting.  She has had some gas and diarrhea but not profuse.  She has not had a fever with this.  She has not been able to eat or drink anything today.  She is also having pain in her back with this.   Past Medical History:  Diagnosis Date   Anemia    Asthma    Chronic abdominal pain    GERD (gastroesophageal reflux disease)    Hypertension    Insomnia    Migraine    Thyroid disease     Past Surgical History:  Procedure Laterality Date   ANKLE SURGERY     DILATION AND CURETTAGE OF UTERUS     FOOT SURGERY     GASTRIC BYPASS     LAPAROSCOPY ABDOMEN DIAGNOSTIC  04/20/2018   LYSIS OF ADHESION N/A 04/20/2018   Procedure: LAPAROSCOPIC LYSIS OF ADHESION;  Surgeon: Sheliah Hatch, De Blanch, MD;  Location: [facility] OR;  Service: General;  Laterality: N/A;   TUBAL LIGATION     VENTRAL HERNIA REPAIR N/A 04/20/2018   Procedure: DIAGNOSTIC LAPAROSCOPY;  Surgeon: Kinsinger, De Blanch, MD;  Location: [facility] OR;  Service: General;  Laterality: N/A;    Family History  Problem Relation Age of Onset   Hypertension Mother    Hypertension Father    Heart attack Father    Kidney disease Father    SIDS Sister    Seizures Sister    Heart attack Sister    Dementia Maternal Aunt    Heart attack Maternal Uncle    Diabetes Neg Hx    Hyperlipidemia Neg Hx    Sudden death Neg Hx     Social History   Tobacco Use   Smoking status: Never   Smokeless tobacco:  Never  Vaping Use   Vaping Use: Never used  Substance Use Topics   Alcohol use: No   Drug use: No    Prior to Admission medications   Medication Sig Start Date End Date Taking? Authorizing Provider  amLODipine (NORVASC) 10 MG tablet Take 1 tablet (10 mg total) by mouth daily. 09/02/21   Donato Schultz, DO  calcium citrate-vitamin D (CITRACAL+D) 315-200 MG-UNIT per tablet Take 1 tablet by mouth 2 (two) times daily.    [provider]  cyanocobalamin (,VITAMIN B-12,) 1000 MCG/ML injection Inject 1 mL into the muscle every 30 (thirty) days. 09/05/17   [provider]  dicyclomine (BENTYL) 20 MG tablet Take 1 tablet (20 mg total) by mouth 2 (two) times daily. 10/05/21   Gailen Shelter, PA  Fe Cbn-Fe Gluc-FA-B12-C-DSS (FERRALET 90) 90-1 MG TABS  04/29/12   [provider]  ferrous sulfate 325 (65 FE) MG tablet Take 325 mg by mouth 2 (two) times daily with a meal.     [provider]  HYDROcodone-acetaminophen (NORCO) 5-325 MG tablet Take 2 tablets by mouth every 4 (four) hours as needed for severe  pain. 09/19/19   Cristiano Capri, MD  HYDROcodone-acetaminophen (NORCO/VICODIN) 5-325 MG tablet Take 1 tablet by mouth every 6 (six) hours as needed. 04/10/21   Couture, Cortni S, PA-C  MULTIPLE VITAMIN PO Take by mouth.    [provider]  ondansetron (ZOFRAN-ODT) 4 MG disintegrating tablet Take 1 tablet (4 mg total) by mouth every 8 (eight) hours as needed for nausea or vomiting. 10/05/21   Gailen Shelter, PA  pantoprazole (PROTONIX) 40 MG tablet Take 1 tablet (40 mg total) by mouth daily. 09/02/21   Seabron Spates R, DO  polyethylene glycol powder (MIRALAX) powder Take 17 g by mouth daily. Patient taking differently: Take 17 g by mouth as needed for mild constipation. 11/02/17   Palumbo, April, MD  potassium chloride (KLOR-CON) 20 MEQ packet Take 20 mEq by mouth 2 (two) times daily.     [provider]  protein supplement shake (PREMIER PROTEIN)  LIQD Take 59.1 mLs (2 oz total) by mouth 4 (four) times daily -  with meals and at bedtime. 04/21/18   Sherrie George, PA-C  Thiamine HCl (VITAMIN B-1) 250 MG tablet Take 250 mg by mouth daily.    [provider]  topiramate (TOPAMAX) 200 MG tablet Take 0.5 tablets (100 mg total) by mouth 2 (two) times daily. For migraines 09/02/21   Zola Button, Myrene Buddy R, DO  zolpidem (AMBIEN CR) 12.5 MG CR tablet TAKE 1 TABLET BY MOUTH AT BEDTIME AS NEEDED FOR SLEEP 10/05/21   Zola Button, Grayling Congress, DO  metoCLOPramide (REGLAN) 10 MG tablet Take 1 tablet (10 mg total) by mouth 2 (two) times daily as needed (headache). Patient not taking: Reported on 04/20/2018 01/02/16 09/19/19  Tomasita Crumble, MD  sucralfate (CARAFATE) 1 GM/10ML suspension Take 10 mLs (1 g total) by mouth 4 (four) times daily. Patient not taking: Reported on 04/20/2018 11/22/12 09/19/19  Palumbo, April, MD    Allergies Nsaids, Shellfish allergy, and Tamiflu [oseltamivir phosphate]   REVIEW OF SYSTEMS  Negative except as noted here or in the History of Present Illness.   PHYSICAL EXAMINATION  Initial Vital Signs Blood pressure 123/89, pulse 78, temperature 98.2 F (36.8 C), temperature source Oral, resp. rate 16, weight 80.1 kg, last menstrual period 03/19/2022, SpO2 100 %.  Examination General: Well-developed, well-nourished female in no acute distress; appearance consistent with age of record HENT: normocephalic; atraumatic Eyes: Normal appearance Neck: supple Heart: regular rate and rhythm Lungs: clear to auscultation bilaterally Abdomen: soft; nondistended; suprapubic and epigastric tenderness; no masses or hepatosplenomegaly; bowel sounds present Extremities: No deformity; full range of motion; pulses normal Neurologic: Awake, alert and oriented; motor function intact in all extremities and symmetric; no facial droop Skin: Warm and dry Psychiatric: Normal mood and affect   RESULTS  Summary of this visit's results,  reviewed and interpreted by myself:   EKG Interpretation  Date/Time:    Ventricular Rate:    PR Interval:    QRS Duration:   QT Interval:    QTC Calculation:   R Axis:     Text Interpretation:         Laboratory Studies: Results for orders placed or performed during the hospital encounter of 04/23/22 (from the past 24 hour(s))  Lipase, blood     Status: None   Collection Time: 04/23/22 10:22 PM  Result Value Ref Range   Lipase 29 11 - 51 U/L  Comprehensive metabolic panel     Status: Abnormal   Collection Time: 04/23/22 10:22 PM  Result Value  Ref Range   Sodium 136 135 - 145 mmol/L   Potassium 3.5 3.5 - 5.1 mmol/L   Chloride 113 (H) 98 - 111 mmol/L   CO2 18 (L) 22 - 32 mmol/L   Glucose, Bld 106 (H) 70 - 99 mg/dL   BUN 24 (H) 6 - 20 mg/dL   Creatinine, Ser 9.70 0.44 - 1.00 mg/dL   Calcium 8.2 (L) 8.9 - 10.3 mg/dL   Total Protein 6.6 6.5 - 8.1 g/dL   Albumin 3.1 (L) 3.5 - 5.0 g/dL   AST 13 (L) 15 - 41 U/L   ALT 6 0 - 44 U/L   Alkaline Phosphatase 87 38 - 126 U/L   Total Bilirubin 0.8 0.3 - 1.2 mg/dL   GFR, Estimated >26 >37 mL/min   Anion gap 5 5 - 15  CBC     Status: Abnormal   Collection Time: 04/23/22 10:22 PM  Result Value Ref Range   WBC 7.3 4.0 - 10.5 K/uL   RBC 4.07 3.87 - 5.11 MIL/uL   Hemoglobin 12.0 12.0 - 15.0 g/dL   HCT 85.8 (L) 85.0 - 27.7 %   MCV 88.2 80.0 - 100.0 fL   MCH 29.5 26.0 - 34.0 pg   MCHC 33.4 30.0 - 36.0 g/dL   RDW 41.2 87.8 - 67.6 %   Platelets 295 150 - 400 K/uL   nRBC 0.0 0.0 - 0.2 %  Urinalysis, Routine w reflex microscopic     Status: Abnormal   Collection Time: 04/23/22 10:22 PM  Result Value Ref Range   Color, Urine YELLOW YELLOW   APPearance CLEAR CLEAR   Specific Gravity, Urine >=1.030 1.005 - 1.030   pH 5.5 5.0 - 8.0   Glucose, UA NEGATIVE NEGATIVE mg/dL   Hgb urine dipstick LARGE (A) NEGATIVE   Bilirubin Urine NEGATIVE NEGATIVE   Ketones, ur 40 (A) NEGATIVE mg/dL   Protein, ur NEGATIVE NEGATIVE mg/dL   Nitrite  NEGATIVE NEGATIVE   Leukocytes,Ua NEGATIVE NEGATIVE  Pregnancy, urine     Status: None   Collection Time: 04/23/22 10:22 PM  Result Value Ref Range   Preg Test, Ur NEGATIVE NEGATIVE  Urinalysis, Microscopic (reflex)     Status: Abnormal   Collection Time: 04/23/22 10:22 PM  Result Value Ref Range   RBC / HPF 21-50 0 - 5 RBC/hpf   WBC, UA 0-5 0 - 5 WBC/hpf   Bacteria, UA FEW (A) NONE SEEN   Squamous Epithelial / LPF 0-5 0 - 5   Mucus PRESENT    Imaging Studies: No results found.  ED COURSE and MDM  Nursing notes, initial and subsequent vitals signs, including pulse oximetry, reviewed and interpreted by myself.  Vitals:   04/23/22 2216 04/23/22 2217  BP: 123/89   Pulse: 78   Resp: 16   Temp: 98.2 F (36.8 C)   TempSrc: Oral   SpO2: 100%   Weight:  80.1 kg   Medications  sodium chloride 0.9 % bolus 1,000 mL (has no administration in time range)  ondansetron (ZOFRAN) injection 4 mg (has no administration in time range)  fentaNYL (SUBLIMAZE) injection 50 mcg (has no administration in time range)      PROCEDURES  Procedures   ED DIAGNOSES  No diagnosis found.

## 2022-04-23 NOTE — ED Triage Notes (Signed)
Pt has been having abdominal pain and gas today.  Pt has been having nausea and diarrhea today.  Pt has hx of gastric bypass surgery in 2015

## 2022-04-24 ENCOUNTER — Emergency Department (HOSPITAL_BASED_OUTPATIENT_CLINIC_OR_DEPARTMENT_OTHER): Payer: Medicaid Other

## 2022-04-24 ENCOUNTER — Encounter (HOSPITAL_BASED_OUTPATIENT_CLINIC_OR_DEPARTMENT_OTHER): Payer: Self-pay

## 2022-04-24 MED ORDER — ONDANSETRON 8 MG PO TBDP: 8.0000 mg | ORAL_TABLET | Freq: Three times a day (TID) | ORAL | 0 refills | Status: AC | PRN

## 2022-04-24 MED ORDER — IOHEXOL 300 MG/ML  SOLN
100.0000 mL | Freq: Once | INTRAMUSCULAR | Status: AC | PRN
  Administered 2022-04-24: 100 mL via INTRAVENOUS

## 2022-04-24 MED ORDER — HYDROCODONE-ACETAMINOPHEN 5-325 MG PO TABS: 1.0000 | ORAL_TABLET | Freq: Four times a day (QID) | ORAL | 0 refills | Status: DC | PRN

## 2022-05-14 ENCOUNTER — Other Ambulatory Visit (HOSPITAL_COMMUNITY): Payer: Self-pay

## 2022-07-28 ENCOUNTER — Encounter (HOSPITAL_BASED_OUTPATIENT_CLINIC_OR_DEPARTMENT_OTHER): Payer: Self-pay

## 2022-07-28 ENCOUNTER — Emergency Department (HOSPITAL_BASED_OUTPATIENT_CLINIC_OR_DEPARTMENT_OTHER): Payer: PRIVATE HEALTH INSURANCE

## 2022-07-28 ENCOUNTER — Other Ambulatory Visit: Payer: Self-pay

## 2022-07-28 ENCOUNTER — Emergency Department (HOSPITAL_BASED_OUTPATIENT_CLINIC_OR_DEPARTMENT_OTHER)
Admission: EM | Admit: 2022-07-28 | Discharge: 2022-07-28 | Disposition: A | Discharge status: Home / Self Care | Payer: PRIVATE HEALTH INSURANCE | Attending: Emergency Medicine | Admitting: Emergency Medicine

## 2022-07-28 DIAGNOSIS — J101 Influenza due to other identified influenza virus with other respiratory manifestations: Secondary | ICD-10-CM | POA: Diagnosis not present

## 2022-07-28 DIAGNOSIS — Z1152 Encounter for screening for COVID-19: Secondary | ICD-10-CM | POA: Diagnosis not present

## 2022-07-28 DIAGNOSIS — R509 Fever, unspecified: Secondary | ICD-10-CM | POA: Diagnosis present

## 2022-07-28 LAB — RESP PANEL BY RT-PCR (RSV, FLU A&B, COVID)  RVPGX2
Influenza A by PCR: POSITIVE — AB
Influenza B by PCR: NEGATIVE
Resp Syncytial Virus by PCR: NEGATIVE
SARS Coronavirus 2 by RT PCR: NEGATIVE

## 2022-07-28 LAB — GROUP A STREP BY PCR: Group A Strep by PCR: NOT DETECTED

## 2022-07-28 MED ORDER — BENZONATATE 100 MG PO CAPS: 100.0000 mg | ORAL_CAPSULE | Freq: Three times a day (TID) | ORAL | 0 refills | Status: AC

## 2022-07-28 NOTE — Discharge Instructions (Signed)
Please read and follow all provided instructions.  Your diagnoses today include:  1. Influenza A     Tests performed today include: Flu test positive for flu A, negative for COVID and RSV Vital signs. See below for your results today.   Medications prescribed:  Tessalon Perles - cough suppressant medication  Take any prescribed medications only as directed.  Home care instructions:  Follow any educational materials contained in this packet. Please continue drinking plenty of fluids. Use over-the-counter cold and flu medications as needed as directed on packaging for symptom relief. You may also use ibuprofen or tylenol as directed on packaging for pain or fever.   BE VERY CAREFUL not to take multiple medicines containing Tylenol (also called acetaminophen). Doing so can lead to an overdose which can damage your liver and cause liver failure and possibly death.   Follow-up instructions: Please follow-up with your primary care provider in the next 3 days for further evaluation of your symptoms.   Return instructions:  Please return to the Emergency Department if you experience worsening symptoms. Please return if you have a high fever greater than 101 degrees not controlled with over-the-counter medications, persistent vomiting and cannot keep down fluids, or worsening trouble breathing. Please return if you have any other emergent concerns.  Additional Information:  Your vital signs today were: BP 108/74 (BP Location: Left Arm)   Pulse 100   Temp 99.1 F (37.3 C) (Oral)   Resp 18   Ht 5\' 6"  (1.676 m)   Wt 78 kg   SpO2 100%   BMI 27.76 kg/m  If your blood pressure (BP) was elevated above 135/85 this visit, please have this repeated by your doctor within one month.

## 2022-07-28 NOTE — ED Notes (Signed)
Discharge instructions reviewed with patient. Patient verbalizes understanding, no further questions at this time. Medications/prescriptions and follow up information provided. No acute distress noted at time of departure.  

## 2022-07-28 NOTE — ED Triage Notes (Signed)
Congestion, sore throat, cough, chest pain with coughing, shortness of breath on exertion. Fatigue, body aches, tmax of 100.16f. Took tylenol last night.  Daughter with similar symptoms.

## 2022-07-28 NOTE — ED Provider Notes (Signed)
[place] [facility] [place] EMERGENCY DEPARTMENT Provider Note   CSN: [id number] Arrival date & time: 07/28/22  [id number]     History  Chief Complaint  Patient presents with   Nasal Congestion    Anna [name] is a 51 y.o. female.  Patient presents to the emergency department today for 2-day history of congestion, sore throat, fever to 100.3 F at home, chest pain and burning, muscle pain and burning, shortness of breath, and headache.  She has been treating her symptoms with Tylenol.  She has had nausea but no vomiting or diarrhea.  No skin rashes.       Home Medications Prior to Admission medications   Medication Sig Start Date End Date Taking? Authorizing Provider  benzonatate (TESSALON) 100 MG capsule Take 1 capsule (100 mg total) by mouth every 8 (eight) hours. 07/28/22  Yes Carlisle Cater, PA-C  amLODipine (NORVASC) 10 MG tablet Take 1 tablet (10 mg total) by mouth daily. 09/02/21   [name], DO  calcium citrate-vitamin D (CITRACAL+D) 315-200 MG-UNIT per tablet Take 1 tablet by mouth 2 (two) times daily.    [provider]  cyanocobalamin (,VITAMIN B-12,) 1000 MCG/ML injection Inject 1 mL into the muscle every 30 (thirty) days. 09/05/17   [provider]  dicyclomine (BENTYL) 20 MG tablet Take 1 tablet (20 mg total) by mouth 2 (two) times daily. 10/05/21   Tedd Sias, PA  Fe Cbn-Fe Gluc-FA-B12-C-DSS (FERRALET 90) 90-1 MG TABS  04/29/12   [provider]  ferrous sulfate 325 (65 FE) MG tablet Take 325 mg by mouth 2 (two) times daily with a meal.     [provider]  HYDROcodone-acetaminophen (NORCO) 5-325 MG tablet Take 1 tablet by mouth every 6 (six) hours as needed for severe pain or moderate pain. 04/24/22   Molpus, John, MD  MULTIPLE VITAMIN PO Take by mouth.    [provider]  ondansetron (ZOFRAN-ODT) 8 MG disintegrating tablet Take 1 tablet (8 mg total) by mouth every 8 (eight) hours as needed for nausea or  vomiting. 04/24/22   Molpus, Jenny Reichmann, MD  pantoprazole (PROTONIX) 40 MG tablet Take 1 tablet (40 mg total) by mouth daily. 09/02/21   Roma Schanz R, DO  polyethylene glycol powder (MIRALAX) powder Take 17 g by mouth daily. Patient taking differently: Take 17 g by mouth as needed for mild constipation. 11/02/17   Palumbo, April, MD  potassium chloride (KLOR-CON) 20 MEQ packet Take 20 mEq by mouth 2 (two) times daily.     [provider]  protein supplement shake (PREMIER PROTEIN) LIQD Take 59.1 mLs (2 oz total) by mouth 4 (four) times daily -  with meals and at bedtime. 04/21/18   Earnstine Regal, PA-C  Thiamine HCl (VITAMIN B-1) 250 MG tablet Take 250 mg by mouth daily.    [provider]  topiramate (TOPAMAX) 200 MG tablet Take 0.5 tablets (100 mg total) by mouth 2 (two) times daily. For migraines 09/02/21   Carollee Herter, Kendrick Fries R, DO  zolpidem (AMBIEN CR) 12.5 MG CR tablet TAKE 1 TABLET BY MOUTH AT BEDTIME AS NEEDED FOR SLEEP 10/05/21   Carollee Herter, Alferd Apa, DO  metoCLOPramide (REGLAN) 10 MG tablet Take 1 tablet (10 mg total) by mouth 2 (two) times daily as needed (headache). Patient not taking: Reported on 04/20/2018 01/02/16 09/19/19  Everlene Balls, MD  sucralfate (CARAFATE) 1 GM/10ML suspension Take 10 mLs (1 g total) by mouth 4 (four) times daily. Patient not taking: Reported  on 04/20/2018 11/22/12 09/19/19  Palumbo, April, MD      Allergies    Nsaids, Shellfish allergy, and Tamiflu [oseltamivir phosphate]    Review of Systems   Review of Systems  Physical Exam Updated Vital Signs BP 108/74 (BP Location: Left Arm)   Pulse 100   Temp 99.1 F (37.3 C) (Oral)   Resp 18   Ht 5\' 6"  (1.676 m)   Wt 78 kg   SpO2 100%   BMI 27.76 kg/m  Physical Exam Vitals and nursing note reviewed.  Constitutional:      Appearance: She is well-developed.  HENT:     Head: Normocephalic and atraumatic.     Jaw: No trismus.     Right Ear: Tympanic membrane, ear canal and external ear  normal.     Left Ear: Tympanic membrane, ear canal and external ear normal.     Nose: Congestion present. No mucosal edema or rhinorrhea.     Mouth/Throat:     Mouth: Mucous membranes are moist. Mucous membranes are not dry. No oral lesions.     Pharynx: Uvula midline. No oropharyngeal exudate, posterior oropharyngeal erythema or uvula swelling.     Tonsils: No tonsillar abscesses.  Eyes:     General:        Right eye: No discharge.        Left eye: No discharge.     Conjunctiva/sclera: Conjunctivae normal.  Cardiovascular:     Rate and Rhythm: Normal rate and regular rhythm.     Heart sounds: Normal heart sounds.  Pulmonary:     Effort: Pulmonary effort is normal. No respiratory distress.     Breath sounds: Normal breath sounds. No wheezing or rales.  Abdominal:     Palpations: Abdomen is soft.     Tenderness: There is no abdominal tenderness.  Musculoskeletal:     Cervical back: Normal range of motion and neck supple.  Lymphadenopathy:     Cervical: No cervical adenopathy.  Skin:    General: Skin is warm and dry.  Neurological:     Mental Status: She is alert.  Psychiatric:        Mood and Affect: Mood normal.     ED Results / Procedures / Treatments   Labs (all labs ordered are listed, but only abnormal results are displayed) Labs Reviewed  RESP PANEL BY RT-PCR (RSV, FLU A&B, COVID)  [id number] - Abnormal; Notable for the following components:      Result Value   Influenza A by PCR POSITIVE (*)    All other components within normal limits  GROUP A STREP BY PCR    EKG None  Radiology DG Chest Portable 1 View  Result Date: 07/28/2022 CLINICAL DATA:  Chest pain with cough and shortness of breath. Low-grade fever. EXAM: PORTABLE CHEST 1 VIEW COMPARISON:  Radiographs 06/23/2020 and 06/07/2019. FINDINGS: 0919 hours. Lordotic positioning. The heart size and mediastinal contours are normal. The lungs are clear. There is no pleural effusion or pneumothorax. No acute osseous  findings are identified. IMPRESSION: No evidence of active cardiopulmonary process. Electronically Signed   By: 06/09/2019 M.D.   On: 07/28/2022 09:38    Procedures Procedures    Medications Ordered in ED Medications - No data to display  ED Course/ Medical Decision Making/ A&P    Patient seen and examined. History obtained directly from patient. Work-up including labs, imaging, EKG ordered in triage, if performed, were reviewed.    Labs/EKG: Independently reviewed and interpreted.  This included:  Respiratory panel positive for flu A  Imaging: Independently reviewed and interpreted.  This included: Chest x-ray, agree negative  Medications/Fluids: None ordered  Most recent vital signs reviewed and are as follows: BP 108/74 (BP Location: Left Arm)   Pulse 100   Temp 99.1 F (37.3 C) (Oral)   Resp 18   Ht 5\' 6"  (1.676 m)   Wt 78 kg   SpO2 100%   BMI 27.76 kg/m   Initial impression: Influenza A  Home treatment plan: Discussed OTC meds.  Offered Tamiflu, patient has had a "reaction" to this in the past and declines.  Return instructions discussed with patient: Return with worsening or increased work of breathing, persistent vomiting, new or worsening symptoms  Follow-up instructions discussed with patient: Follow-up with PCP in 5 days if not improving.                          Medical Decision Making Amount and/or Complexity of Data Reviewed Radiology: ordered.  Risk Prescription drug management.   Patient with symptoms consistent with influenza. Vitals are stable, low-grade fever. No signs of dehydration, tolerating PO's. Lungs are clear. Supportive therapy indicated with return if symptoms worsen. Patient counseled.  The patient's vital signs, pertinent lab work and imaging were reviewed and interpreted as discussed in the ED course. Hospitalization was considered for further testing, treatments, or serial exams/observation. However as patient is well-appearing,  has a stable exam, and reassuring studies today, I do not feel that they warrant admission at this time. This plan was discussed with the patient who verbalizes agreement and comfort with this plan and seems reliable and able to return to the Emergency Department with worsening or changing symptoms.           Final Clinical Impression(s) / ED Diagnoses Final diagnoses:  Influenza A    Rx / DC Orders ED Discharge Orders          Ordered    benzonatate (TESSALON) 100 MG capsule  Every 8 hours        07/28/22 1119              Carlisle Cater, PA-C 07/28/22 1124    Wyvonnia Dusky, MD 07/28/22 1325

## 2022-12-03 ENCOUNTER — Encounter (HOSPITAL_BASED_OUTPATIENT_CLINIC_OR_DEPARTMENT_OTHER): Payer: Self-pay | Admitting: Urology

## 2022-12-03 ENCOUNTER — Emergency Department (HOSPITAL_BASED_OUTPATIENT_CLINIC_OR_DEPARTMENT_OTHER)
Admission: EM | Admit: 2022-12-03 | Discharge: 2022-12-03 | Disposition: A | Discharge status: Home / Self Care | Payer: PRIVATE HEALTH INSURANCE | Attending: Emergency Medicine | Admitting: Emergency Medicine

## 2022-12-03 ENCOUNTER — Other Ambulatory Visit: Payer: Self-pay

## 2022-12-03 ENCOUNTER — Emergency Department (HOSPITAL_BASED_OUTPATIENT_CLINIC_OR_DEPARTMENT_OTHER): Payer: PRIVATE HEALTH INSURANCE

## 2022-12-03 DIAGNOSIS — I10 Essential (primary) hypertension: Secondary | ICD-10-CM | POA: Insufficient documentation

## 2022-12-03 DIAGNOSIS — Z79899 Other long term (current) drug therapy: Secondary | ICD-10-CM | POA: Insufficient documentation

## 2022-12-03 DIAGNOSIS — M25561 Pain in right knee: Secondary | ICD-10-CM | POA: Diagnosis present

## 2022-12-03 DIAGNOSIS — J45909 Unspecified asthma, uncomplicated: Secondary | ICD-10-CM | POA: Insufficient documentation

## 2022-12-03 MED ORDER — DICLOFENAC SODIUM 1 % EX GEL: 2.0000 g | Freq: Four times a day (QID) | CUTANEOUS | 0 refills | Status: AC

## 2022-12-03 NOTE — ED Provider Notes (Signed)
[facility] Provider Note   CSN: [id number] Arrival date & time: 12/03/22  0857     History  Chief Complaint  Patient presents with   Knee Pain    Anna [name] is a 52 y.o. female.  The history is provided by the patient and medical records. No language interpreter was used.  Knee Pain Location:  Knee Time since incident:  2 months Injury: no   Knee location:  R knee Pain details:    Quality:  Aching   Radiates to:  Does not radiate   Severity:  Moderate   Onset quality:  Gradual   Timing:  Constant   Progression:  Waxing and waning Chronicity:  Recurrent Foreign body present:  No foreign bodies Tetanus status:  Unknown Prior injury to area:  No Relieved by:  Nothing Worsened by:  Bearing weight Ineffective treatments:  None tried Associated symptoms: no back pain, no fatigue, no fever, no itching, no muscle weakness, no neck pain, no numbness, no stiffness, no swelling (resolved swelling) and no tingling        Home Medications Prior to Admission medications   Medication Sig Start Date End Date Taking? Authorizing Provider  amLODipine (NORVASC) 10 MG tablet Take 1 tablet (10 mg total) by mouth daily. 09/02/21   Donato Schultz, DO  benzonatate (TESSALON) 100 MG capsule Take 1 capsule (100 mg total) by mouth every 8 (eight) hours. 07/28/22   Renne Crigler, PA-C  calcium citrate-vitamin D (CITRACAL+D) 315-200 MG-UNIT per tablet Take 1 tablet by mouth 2 (two) times daily.    [provider]  cyanocobalamin (,VITAMIN B-12,) 1000 MCG/ML injection Inject 1 mL into the muscle every 30 (thirty) days. 09/05/17   [provider]  dicyclomine (BENTYL) 20 MG tablet Take 1 tablet (20 mg total) by mouth 2 (two) times daily. 10/05/21   Gailen Shelter, PA  Fe Cbn-Fe Gluc-FA-B12-C-DSS (FERRALET 90) 90-1 MG TABS  04/29/12   [provider]  ferrous sulfate 325 (65 FE) MG tablet Take 325 mg by  mouth 2 (two) times daily with a meal.     [provider]  HYDROcodone-acetaminophen (NORCO) 5-325 MG tablet Take 1 tablet by mouth every 6 (six) hours as needed for severe pain or moderate pain. 04/24/22   Molpus, John, MD  MULTIPLE VITAMIN PO Take by mouth.    [provider]  ondansetron (ZOFRAN-ODT) 8 MG disintegrating tablet Take 1 tablet (8 mg total) by mouth every 8 (eight) hours as needed for nausea or vomiting. 04/24/22   Molpus, Jonny Ruiz, MD  pantoprazole (PROTONIX) 40 MG tablet Take 1 tablet (40 mg total) by mouth daily. 09/02/21   Seabron Spates R, DO  polyethylene glycol powder (MIRALAX) powder Take 17 g by mouth daily. Patient taking differently: Take 17 g by mouth as needed for mild constipation. 11/02/17   Palumbo, April, MD  potassium chloride (KLOR-CON) 20 MEQ packet Take 20 mEq by mouth 2 (two) times daily.     [provider]  protein supplement shake (PREMIER PROTEIN) LIQD Take 59.1 mLs (2 oz total) by mouth 4 (four) times daily -  with meals and at bedtime. 04/21/18   Sherrie George, PA-C  Thiamine HCl (VITAMIN B-1) 250 MG tablet Take 250 mg by mouth daily.    [provider]  topiramate (TOPAMAX) 200 MG tablet Take 0.5 tablets (100 mg total) by mouth 2 (two) times daily. For migraines 09/02/21   Zola Button,  Grayling Congress, DO  zolpidem (AMBIEN CR) 12.5 MG CR tablet TAKE 1 TABLET BY MOUTH AT BEDTIME AS NEEDED FOR SLEEP 10/05/21   Zola Button, Grayling Congress, DO  metoCLOPramide (REGLAN) 10 MG tablet Take 1 tablet (10 mg total) by mouth 2 (two) times daily as needed (headache). Patient not taking: Reported on 04/20/2018 01/02/16 09/19/19  Tomasita Crumble, MD  sucralfate (CARAFATE) 1 GM/10ML suspension Take 10 mLs (1 g total) by mouth 4 (four) times daily. Patient not taking: Reported on 04/20/2018 11/22/12 09/19/19  Nicanor Alcon, April, MD      Allergies    Nsaids, Shellfish allergy, and Tamiflu [oseltamivir phosphate]    Review of Systems   Review of Systems   Constitutional:  Negative for chills, fatigue and fever.  HENT:  Negative for congestion.   Respiratory:  Negative for cough, chest tightness, shortness of breath and wheezing.   Cardiovascular:  Negative for chest pain, palpitations and leg swelling.  Gastrointestinal:  Negative for abdominal pain, constipation, diarrhea, nausea and vomiting.  Musculoskeletal:  Negative for back pain, neck pain and stiffness.  Skin:  Negative for color change, itching, rash and wound.  Neurological:  Negative for weakness, numbness and headaches.  Psychiatric/Behavioral:  Negative for agitation.   All other systems reviewed and are negative.   Physical Exam Updated Vital Signs BP 123/82 (BP Location: Right Arm)   Pulse 62   Temp 98 F (36.7 C)   Resp 18   Ht 5\' 6"  (1.676 m)   Wt 78 kg   SpO2 100%   BMI 27.75 kg/m  Physical Exam Vitals and nursing note reviewed.  Constitutional:      General: She is not in acute distress.    Appearance: She is well-developed. She is not ill-appearing.  HENT:     Head: Normocephalic and atraumatic.  Eyes:     Conjunctiva/sclera: Conjunctivae normal.  Cardiovascular:     Rate and Rhythm: Normal rate and regular rhythm.     Heart sounds: No murmur heard. Pulmonary:     Effort: Pulmonary effort is normal. No respiratory distress.     Breath sounds: Normal breath sounds.  Abdominal:     Palpations: Abdomen is soft.  Musculoskeletal:        General: Tenderness present. No swelling.     Cervical back: Neck supple.     Right lower leg: Tenderness present. No swelling, deformity or lacerations. No edema.     Left lower leg: No edema.       Legs:     Comments: Mild tenderness of the anterior knee both inferiorly and bilaterally under the kneecap.  No tenderness on the actual patella or posteriorly.  Intact sensation, strength, pulses distally.  No increase in pain with passive manipulation of the knee and patient does not have pain with knee bending.  No  swelling, erythema or crepitance appreciated.  Good pulses distally.  No tenderness of the hip or ankle.  No shin or calf tenderness.  Skin:    General: Skin is warm and dry.     Capillary Refill: Capillary refill takes less than 2 seconds.     Findings: No erythema or rash.  Neurological:     General: No focal deficit present.     Mental Status: She is alert.  Psychiatric:        Mood and Affect: Mood normal.     ED Results / Procedures / Treatments   Labs (all labs ordered are listed, but only abnormal results are displayed)  Labs Reviewed - No data to display  EKG None  Radiology DG Knee Complete 4 Views Right  Result Date: 12/03/2022 CLINICAL DATA:  Right anterior knee pain for 2 months EXAM: RIGHT KNEE - COMPLETE 4 VIEW COMPARISON:  None Available. FINDINGS: No evidence of fracture, dislocation, or joint effusion. No evidence of arthropathy or other focal bone abnormality. Soft tissues are unremarkable. IMPRESSION: No acute osseous abnormality Electronically Signed   By: Karen Kays M.D.   On: 12/03/2022 10:18    Procedures Procedures    Medications Ordered in ED Medications - No data to display  ED Course/ Medical Decision Making/ A&P                             Medical Decision Making Amount and/or Complexity of Data Reviewed Radiology: ordered.    Anna [name] is a 52 y.o. female with a past medical history significant for hypertension, GERD, asthma, migraines, and previous muscle skeletal pains who presents with right knee pain.  According to patient, for the last 2 months or so she has had pain developing in her right knee.  Primary hurts when she stands or walks on it but also hurts at rest.  She denies any focal trauma.  She denies any fevers, chills, or infectious symptoms.  She reports that it swelled up at 1 point but that has improved.  She also reports she thought it was red but then it improved.  She denies any skin changes at this time.  Denies  any help with over-the-counter medications.  She reports she is scheduled to see her primary doctor next week but due to the pain continued she wanted to get seen earlier.  She denies any numbness tingling, weakness distally.  Denies any skin changes or lacerations.  Denies history of blood clots and is not having any pain in her calf, posterior knee popliteal fossa area, or posterior leg.  Denies any back pain or neck pain.  Denies any other trauma.  Denies any abdominal pain, nausea and vomiting.  Denies any other previous joint problems such as septic arthritis.  On exam, patient has some mild to moderate tenderness in the inferior anterior and bilateral knee on the right side.  No tenderness of the patella.  No tenderness in the posterior the leg.  No pain with passive knee manipulation or when she moved her self.  She had intact sensation, strength, pulses distally.  No tenderness of the ankle or hip.  Exam otherwise unremarkable.  No erythema, redness, or skin changes seen.  No warmth or crepitance.  Patient resting comfortably now.  We had a shared decision conversation with patient.  As this is gone on for several months I have low suspicion for septic arthritis especially in the setting of lack of swelling, lack of pain with knee manipulation, and lack of erythema/warmth.  With lack of pain in her calf, popliteal area, or posterior upper leg have less suspicion for DVT, will hold on ultrasound and patient agrees.  Will get x-ray to look for fracture or acute bony abnormality although clinically suspect this is likely musculoskeletal.  We discussed that we do not have MRI here and she may need one as an outpatient.  We agreed to get x-ray and if it is reassuring, suspect we will do knee immobilizer and discuss other medications to try until she can see her doctor next week.   11:31 AM X-ray shows no  acute bony abnormality and no joint effusion.  Given lack of effusion or exam we agreed to hold on  aspiration and do not feel she needs more advanced imaging today.  We discussed that she may need advanced imaging as directed by her PCP or orthopedist team but we will give her knee immobilizer and crutches.  She was over-the-counter medications but we will also add a prescription for some Voltaren gel.  Patient agrees with this plan.  She no other questions or concerns.  Patient discharged in good condition for outpatient follow-up.        Final Clinical Impression(s) / ED Diagnoses Final diagnoses:  Right knee pain, unspecified chronicity    Rx / DC Orders ED Discharge Orders          Ordered    diclofenac Sodium (VOLTAREN) 1 % GEL  4 times daily        12/03/22 1133            Clinical Impression: 1. Right knee pain, unspecified chronicity     Disposition: Discharge  Condition: Good  I have discussed the results, Dx and Tx plan with the pt(& family if present). He/she/they expressed understanding and agree(s) with the plan. Discharge instructions discussed at great length. Strict return precautions discussed and pt &/or family have verbalized understanding of the instructions. No further questions at time of discharge.    New Prescriptions   DICLOFENAC SODIUM (VOLTAREN) 1 % GEL    Apply 2 g topically 4 (four) times daily.    Follow Up: Kathryne Hitch, MD [address] [place] [address] [phone]   with orthopedics      Akilah Cureton, Canary Brim, MD 12/03/22 1135

## 2022-12-03 NOTE — ED Triage Notes (Signed)
Pt states right knee pain x 1.5 months  No known injury  Pain worse with weight bearing  Pt states has appointment with pcp on 5/1

## 2022-12-03 NOTE — Discharge Instructions (Signed)
Your history, exam, evaluation today are consistent with likely musculoskeletal pain in your right knee that has been ongoing for the last few months.  X-ray today did not show acute abnormality and your exam did not seem consistent with septic arthritis infection or blood clot at this time.  We agreed together to hold on more extensive labs, aspiration, imaging today but do feel any knee immobilization and crutches and follow-up with orthopedics.  They may want advanced imaging as an outpatient.  Please use the anti-inflammatory gel to help with symptoms and rest.  If any symptoms change or worsen acutely, please return to the nearest emergency department.

## 2022-12-03 NOTE — ED Notes (Signed)
Pt declined crutches, does not feel safe using them at home, will utilize immobilizer.  Instructed on applying brace and care.

## 2022-12-13 ENCOUNTER — Ambulatory Visit: Payer: PRIVATE HEALTH INSURANCE | Admitting: Physician Assistant

## 2022-12-13 ENCOUNTER — Ambulatory Visit (INDEPENDENT_AMBULATORY_CARE_PROVIDER_SITE_OTHER): Payer: PRIVATE HEALTH INSURANCE | Admitting: Orthopaedic Surgery

## 2022-12-13 ENCOUNTER — Other Ambulatory Visit: Payer: Self-pay

## 2022-12-13 DIAGNOSIS — M25461 Effusion, right knee: Secondary | ICD-10-CM

## 2022-12-13 DIAGNOSIS — M25561 Pain in right knee: Secondary | ICD-10-CM | POA: Diagnosis not present

## 2022-12-13 DIAGNOSIS — G8929 Other chronic pain: Secondary | ICD-10-CM

## 2022-12-13 MED ORDER — CELECOXIB 200 MG PO CAPS: 200.0000 mg | ORAL_CAPSULE | Freq: Two times a day (BID) | ORAL | 1 refills | Status: AC | PRN

## 2022-12-13 NOTE — Progress Notes (Signed)
The patient is an active 52 year old female who comes in with acute on chronic right knee pain.  She has been having knee pain for some time now but recently developed significant right knee pain and swelling enough that she went to the emergency room recently and referred here.  X-rays from the emergency room of her right knee were normal however she has developed an effusion of that knee.  She was then seen at urgent care yesterday with the [facility] urgent care and she states that they placed a steroid injection in her right knee.  She does have a small knee sleeve/brace that she is wearing.  She is walking with a limp and having significant difficulty with the right knee.  She is never had an injury to that knee and has never had surgery on that knee.  It has been slowly getting worse with time but then recently really flared up on her.  On examination her right knee is not red but there is definitely an effusion.  It is painful and swollen compared to her other side.  It is very painful and I cannot flex her past 90 degrees secondary to her effusion and the pain she is having with her right knee.  It is definitely warm as well.  X-rays were reviewed of her right knee and it shows no internal derangement and good alignment overall.  Given her effusion I am suspicious about some type of pathology within the right knee.  Since she did just have a steroid injection yesterday I am hesitant to aspirate the knee today.  However I do feel it is medically necessary to obtain an MRI of her right knee to assess for internal derangement given the effusion and the amount of pain she is having with the knee.  I think this is medically necessary at this point.  We will give her a note to allow her to work from home on the computer with Zoom appointments.  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  I will also send in some Celebrex for inflammation.  She does report an allergy to NSAIDs but it is more GI distress so  I do feel that Celebrex will be easier on the stomach for short-term.  We will see her in follow-up once we have the MRI of her right knee.

## 2023-01-01 ENCOUNTER — Other Ambulatory Visit: Payer: PRIVATE HEALTH INSURANCE

## 2023-04-11 ENCOUNTER — Other Ambulatory Visit: Payer: Self-pay

## 2023-04-11 ENCOUNTER — Emergency Department (HOSPITAL_BASED_OUTPATIENT_CLINIC_OR_DEPARTMENT_OTHER): Payer: PRIVATE HEALTH INSURANCE

## 2023-04-11 ENCOUNTER — Emergency Department (HOSPITAL_BASED_OUTPATIENT_CLINIC_OR_DEPARTMENT_OTHER)
Admission: EM | Admit: 2023-04-11 | Discharge: 2023-04-11 | Disposition: A | Discharge status: Home / Self Care | Payer: PRIVATE HEALTH INSURANCE | Attending: Emergency Medicine | Admitting: Emergency Medicine

## 2023-04-11 ENCOUNTER — Encounter (HOSPITAL_BASED_OUTPATIENT_CLINIC_OR_DEPARTMENT_OTHER): Payer: Self-pay

## 2023-04-11 DIAGNOSIS — R109 Unspecified abdominal pain: Secondary | ICD-10-CM | POA: Diagnosis present

## 2023-04-11 DIAGNOSIS — I10 Essential (primary) hypertension: Secondary | ICD-10-CM | POA: Diagnosis not present

## 2023-04-11 DIAGNOSIS — K5900 Constipation, unspecified: Secondary | ICD-10-CM | POA: Diagnosis not present

## 2023-04-11 DIAGNOSIS — Z79899 Other long term (current) drug therapy: Secondary | ICD-10-CM | POA: Insufficient documentation

## 2023-04-11 LAB — URINALYSIS, ROUTINE W REFLEX MICROSCOPIC
Bilirubin Urine: NEGATIVE
Glucose, UA: NEGATIVE mg/dL
Hgb urine dipstick: NEGATIVE
Ketones, ur: NEGATIVE mg/dL
Leukocytes,Ua: NEGATIVE
Nitrite: NEGATIVE
Protein, ur: NEGATIVE mg/dL
Specific Gravity, Urine: 1.02 (ref 1.005–1.030)
pH: 7 (ref 5.0–8.0)

## 2023-04-11 LAB — COMPREHENSIVE METABOLIC PANEL
ALT: 6 U/L (ref 0–44)
AST: 14 U/L — ABNORMAL LOW (ref 15–41)
Albumin: 3.3 g/dL — ABNORMAL LOW (ref 3.5–5.0)
Alkaline Phosphatase: 88 U/L (ref 38–126)
Anion gap: 5 (ref 5–15)
BUN: 15 mg/dL (ref 6–20)
CO2: 22 mmol/L (ref 22–32)
Calcium: 8.4 mg/dL — ABNORMAL LOW (ref 8.9–10.3)
Chloride: 111 mmol/L (ref 98–111)
Creatinine, Ser: 0.96 mg/dL (ref 0.44–1.00)
GFR, Estimated: >60 mL/min (ref >60)
Glucose, Bld: 105 mg/dL — ABNORMAL HIGH (ref 70–99)
Potassium: 3.7 mmol/L (ref 3.5–5.1)
Sodium: 138 mmol/L (ref 135–145)
Total Bilirubin: 0.4 mg/dL (ref 0.3–1.2)
Total Protein: 7.1 g/dL (ref 6.5–8.1)

## 2023-04-11 LAB — CBC
HCT: 34.6 % — ABNORMAL LOW (ref 36.0–46.0)
Hemoglobin: 11.3 g/dL — ABNORMAL LOW (ref 12.0–15.0)
MCH: 29.3 pg (ref 26.0–34.0)
MCHC: 32.7 g/dL (ref 30.0–36.0)
MCV: 89.6 fL (ref 80.0–100.0)
Platelets: 268 10*3/uL (ref 150–400)
RBC: 3.86 MIL/uL — ABNORMAL LOW (ref 3.87–5.11)
RDW: 15.9 % — ABNORMAL HIGH (ref 11.5–15.5)
WBC: 3.6 10*3/uL — ABNORMAL LOW (ref 4.0–10.5)
nRBC: 0 % (ref 0.0–0.2)

## 2023-04-11 LAB — PREGNANCY, URINE: Preg Test, Ur: NEGATIVE

## 2023-04-11 LAB — LIPASE, BLOOD: Lipase: 36 U/L (ref 11–51)

## 2023-04-11 MED ORDER — ONDANSETRON HCL 4 MG/2ML IJ SOLN
4.0000 mg | Freq: Once | INTRAMUSCULAR | Status: AC
  Administered 2023-04-11: 4 mg via INTRAVENOUS
  Filled 2023-04-11: qty 2

## 2023-04-11 MED ORDER — MORPHINE SULFATE (PF) 4 MG/ML IV SOLN
4.0000 mg | Freq: Once | INTRAVENOUS | Status: AC
  Administered 2023-04-11: 4 mg via INTRAVENOUS
  Filled 2023-04-11: qty 1

## 2023-04-11 NOTE — ED Provider Notes (Signed)
[place] [facility] AT [facility] Provider Note   CSN: [id number] Arrival date & time: 04/11/23  1116     History  Chief Complaint  Patient presents with   Back Pain    Anna [name] is a 52 y.o. female with a history of GERD, hypertension, and bypass surgery who presents to the ED today for back pain.  Patient reports left flank pain since yesterday that radiates laterally to her side. Pain does not extend to the abdomen. Pain started yesterday while she was going to a birthday party and has persisted since. Endorses small bowel movement and loose stool yesterday. Denies any injury or trauma to the back. No fever, weakness, vomiting, or dysuria.  Denies any other concerns or complaints at this time.    Home Medications Prior to Admission medications   Medication Sig Start Date End Date Taking? Authorizing Provider  amLODipine (NORVASC) 10 MG tablet Take 1 tablet (10 mg total) by mouth daily. 09/02/21   Donato Schultz, DO  benzonatate (TESSALON) 100 MG capsule Take 1 capsule (100 mg total) by mouth every 8 (eight) hours. 07/28/22   Renne Crigler, PA-C  calcium citrate-vitamin D (CITRACAL+D) 315-200 MG-UNIT per tablet Take 1 tablet by mouth 2 (two) times daily.    [provider]  celecoxib (CELEBREX) 200 MG capsule Take 1 capsule (200 mg total) by mouth 2 (two) times daily between meals as needed. 12/13/22   Kathryne Hitch, MD  cyanocobalamin (,VITAMIN B-12,) 1000 MCG/ML injection Inject 1 mL into the muscle every 30 (thirty) days. 09/05/17   [provider]  diclofenac Sodium (VOLTAREN) 1 % GEL Apply 2 g topically 4 (four) times daily. 12/03/22   Tegeler, Canary Brim, MD  dicyclomine (BENTYL) 20 MG tablet Take 1 tablet (20 mg total) by mouth 2 (two) times daily. 10/05/21   Gailen Shelter, PA  Fe Cbn-Fe Gluc-FA-B12-C-DSS (FERRALET 90) 90-1 MG TABS  04/29/12   [provider]  ferrous sulfate 325 (65 FE) MG tablet  Take 325 mg by mouth 2 (two) times daily with a meal.     [provider]  HYDROcodone-acetaminophen (NORCO) 5-325 MG tablet Take 1 tablet by mouth every 6 (six) hours as needed for severe pain or moderate pain. 04/24/22   Molpus, John, MD  MULTIPLE VITAMIN PO Take by mouth.    [provider]  ondansetron (ZOFRAN-ODT) 8 MG disintegrating tablet Take 1 tablet (8 mg total) by mouth every 8 (eight) hours as needed for nausea or vomiting. 04/24/22   Molpus, Jonny Ruiz, MD  pantoprazole (PROTONIX) 40 MG tablet Take 1 tablet (40 mg total) by mouth daily. 09/02/21   Seabron Spates R, DO  polyethylene glycol powder (MIRALAX) powder Take 17 g by mouth daily. Patient taking differently: Take 17 g by mouth as needed for mild constipation. 11/02/17   Palumbo, April, MD  potassium chloride (KLOR-CON) 20 MEQ packet Take 20 mEq by mouth 2 (two) times daily.     [provider]  protein supplement shake (PREMIER PROTEIN) LIQD Take 59.1 mLs (2 oz total) by mouth 4 (four) times daily -  with meals and at bedtime. 04/21/18   Sherrie George, PA-C  Thiamine HCl (VITAMIN B-1) 250 MG tablet Take 250 mg by mouth daily.    [provider]  topiramate (TOPAMAX) 200 MG tablet Take 0.5 tablets (100 mg total) by mouth 2 (two) times daily. For migraines 09/02/21   Seabron Spates R, DO  zolpidem (  AMBIEN CR) 12.5 MG CR tablet TAKE 1 TABLET BY MOUTH AT BEDTIME AS NEEDED FOR SLEEP 10/05/21   Zola Button, Grayling Congress, DO  metoCLOPramide (REGLAN) 10 MG tablet Take 1 tablet (10 mg total) by mouth 2 (two) times daily as needed (headache). Patient not taking: Reported on 04/20/2018 01/02/16 09/19/19  Tomasita Crumble, MD  sucralfate (CARAFATE) 1 GM/10ML suspension Take 10 mLs (1 g total) by mouth 4 (four) times daily. Patient not taking: Reported on 04/20/2018 11/22/12 09/19/19  Nicanor Alcon, April, MD      Allergies    Nsaids, Paxil [paroxetine], Prozac [fluoxetine], Shellfish allergy, and Tamiflu [oseltamivir  phosphate]    Review of Systems   Review of Systems  Musculoskeletal:  Positive for back pain.  All other systems reviewed and are negative.   Physical Exam Updated Vital Signs BP 109/71   Pulse 66   Temp 98.2 F (36.8 C)   Resp 15   Ht 5\' 6"  (1.676 m)   Wt 74.8 kg   SpO2 98%   BMI 26.63 kg/m  Physical Exam Vitals and nursing note reviewed.  Constitutional:      Appearance: Normal appearance.  HENT:     Head: Normocephalic and atraumatic.     Mouth/Throat:     Mouth: Mucous membranes are moist.  Eyes:     Conjunctiva/sclera: Conjunctivae normal.     Pupils: Pupils are equal, round, and reactive to light.  Cardiovascular:     Rate and Rhythm: Normal rate and regular rhythm.     Pulses: Normal pulses.     Heart sounds: Normal heart sounds.  Pulmonary:     Effort: Pulmonary effort is normal.     Breath sounds: Normal breath sounds.  Abdominal:     Palpations: Abdomen is soft.     Tenderness: There is no abdominal tenderness.  Musculoskeletal:     Comments: Tenderness to palpation of left flank, inferior to the ribs, from the middle of the left flank to the lateral aspect. Tenderness does not extend to the abdomen. No tenderness to palpation of spinous processes or paravertebral muscles.   Skin:    General: Skin is warm and dry.     Findings: No rash.  Neurological:     General: No focal deficit present.     Mental Status: She is alert.     Sensory: No sensory deficit.     Motor: No weakness.  Psychiatric:        Mood and Affect: Mood normal.        Behavior: Behavior normal.    ED Results / Procedures / Treatments   Labs (all labs ordered are listed, but only abnormal results are displayed) Labs Reviewed  COMPREHENSIVE METABOLIC PANEL - Abnormal; Notable for the following components:      Result Value   Glucose, Bld 105 (*)    Calcium 8.4 (*)    Albumin 3.3 (*)    AST 14 (*)    All other components within normal limits  CBC - Abnormal; Notable for the  following components:   WBC 3.6 (*)    RBC 3.86 (*)    Hemoglobin 11.3 (*)    HCT 34.6 (*)    RDW 15.9 (*)    All other components within normal limits  LIPASE, BLOOD  URINALYSIS, ROUTINE W REFLEX MICROSCOPIC  PREGNANCY, URINE    EKG EKG Interpretation Date/Time:  Monday April 11 2023 11:57:59 EDT Ventricular Rate:  63 PR Interval:  166 QRS Duration:  83 QT  Interval:  413 QTC Calculation: 423 R Axis:   29  Text Interpretation: Sinus rhythm Confirmed by Ernie Avena ([id number]) on 04/11/2023 12:03:23 PM  Radiology CT Renal Stone Study  Result Date: 04/11/2023 CLINICAL DATA:  Left upper quadrant pain and left flank pain beginning yesterday. Loose stool. EXAM: CT ABDOMEN AND PELVIS WITHOUT CONTRAST TECHNIQUE: Multidetector CT imaging of the abdomen and pelvis was performed following the standard protocol without IV contrast. RADIATION DOSE REDUCTION: This exam was performed according to the departmental dose-optimization program which includes automated exposure control, adjustment of the mA and/or kV according to patient size and/or use of iterative reconstruction technique. COMPARISON:  04/24/2022 FINDINGS: Lower chest: No acute findings. Hepatobiliary: No mass visualized on this unenhanced exam. Gallbladder is unremarkable. No evidence of biliary ductal dilatation. Pancreas: No mass or inflammatory process visualized on this unenhanced exam. Spleen:  Within normal limits in size. Adrenals/Urinary tract: No evidence of urolithiasis or hydronephrosis. Unremarkable unopacified urinary bladder. Stomach/Bowel: Prior gastric bypass surgery again noted. No evidence of obstruction, inflammatory process, or abnormal fluid collections. Vascular/Lymphatic: No pathologically enlarged lymph nodes identified. No evidence of abdominal aortic aneurysm. Reproductive:  No mass or other significant abnormality. Other:  None. Musculoskeletal:  No suspicious bone lesions identified. IMPRESSION: No evidence of  urolithiasis, hydronephrosis, or other acute findings. Electronically Signed   By: Danae Orleans M.D.   On: 04/11/2023 14:04    Procedures Procedures: not indicated.   Medications Ordered in ED Medications  morphine (PF) 4 MG/ML injection 4 mg (4 mg Intravenous Given 04/11/23 1242)  ondansetron (ZOFRAN) injection 4 mg (4 mg Intravenous Given 04/11/23 1240)    ED Course/ Medical Decision Making/ A&P                                 Medical Decision Making Amount and/or Complexity of Data Reviewed Labs: ordered. Radiology: ordered.  Risk Prescription drug management.   This patient presents to the ED for concern of left flank pain, this involves an extensive number of treatment options, and is a complaint that carries with it a high risk of complications and morbidity.   Differential diagnosis includes: muscle strain, radiculopathy, nephrolithiasis, urolithiasis, pyelonephritis, UTI, constipation, etc.   Comorbidities  See HPI above   Additional History  Additional history obtained from patient's previous records.   Cardiac Monitoring / EKG  The patient was maintained on a cardiac monitor.  I personally viewed and interpreted the cardiac monitored which showed: sinus rhythm with a heart rate of 63 bpm.   Lab Tests  I ordered and personally interpreted labs.  The pertinent results include:   CMP is within normal limits - no AKI or electrolyte abnormality. CBC is within normal limits - no acute infection or anemia. Lipase is unremarkable. UA is unremarkable - no acute infection Negative pregnancy test.   Imaging Studies  I ordered imaging studies including CT renal stone study  I independently visualized and interpreted imaging which showed: no acute findings. I agree with the radiologist interpretation   Problem List / ED Course / Critical Interventions / Medication Management  Left flank pain I ordered medications including: Morphine and Zofran for pain and  nausea  Reevaluation of the patient after these medicines showed that the patient improved I have reviewed the patients home medicines and have made adjustments as needed   Social Determinants of Health  Social connection   Test / Admission - Considered  Discussed  results with patient. All questions were answered. She is hemodynamically stable and safe for discharge home. Instructed to take Miralax for her symptoms. Return precautions provided.       Final Clinical Impression(s) / ED Diagnoses Final diagnoses:  Constipation, unspecified constipation type    Rx / DC Orders ED Discharge Orders     None         Maxwell Marion, PA-C 04/11/23 1440    Ernie Avena, MD 04/11/23 1520

## 2023-04-11 NOTE — Discharge Instructions (Addendum)
As discussed, your imaging and labs do not show any acute findings. Use Miralax to help facilitate normal bowel movements. You can get this over the counter at the pharmacy or grocery store if you do not have any at home.  Follow up with PCP in 3-5 days for reevaluation of your symptoms.  Get help right away if: You have a fever, and your symptoms suddenly get worse. You leak poop or have blood in your poop. Your belly feels hard or bigger than normal (bloated). You have very bad belly pain. You feel dizzy or you faint.

## 2023-04-11 NOTE — ED Triage Notes (Signed)
Pt reports lower-mid back pain since yesterday. Starting to hurt left upper quad. Pain increases with any movement. Possibly constipation per pt. Small Bm this and stool loose yesterday. Denies N/V. Hx of hospitalization for similar problem

## 2023-04-11 NOTE — ED Notes (Signed)
D/c paperwork reviewed with pt, including follow up care.  No questions or concerns voiced at time of d/c. Marland Kitchen Pt verbalized understanding, Ambulatory with family to ED exit, NAD.

## 2023-04-11 NOTE — ED Notes (Addendum)
Entered in error

## 2023-05-15 ENCOUNTER — Emergency Department (HOSPITAL_BASED_OUTPATIENT_CLINIC_OR_DEPARTMENT_OTHER)
Admission: EM | Admit: 2023-05-15 | Discharge: 2023-05-15 | Disposition: A | Discharge status: Home / Self Care | Payer: Medicaid Other | Attending: Emergency Medicine | Admitting: Emergency Medicine

## 2023-05-15 ENCOUNTER — Emergency Department (HOSPITAL_BASED_OUTPATIENT_CLINIC_OR_DEPARTMENT_OTHER): Payer: Medicaid Other

## 2023-05-15 DIAGNOSIS — W19XXXA Unspecified fall, initial encounter: Secondary | ICD-10-CM

## 2023-05-15 DIAGNOSIS — Y92009 Unspecified place in unspecified non-institutional (private) residence as the place of occurrence of the external cause: Secondary | ICD-10-CM | POA: Insufficient documentation

## 2023-05-15 DIAGNOSIS — W01198A Fall on same level from slipping, tripping and stumbling with subsequent striking against other object, initial encounter: Secondary | ICD-10-CM | POA: Diagnosis not present

## 2023-05-15 DIAGNOSIS — M79641 Pain in right hand: Secondary | ICD-10-CM | POA: Diagnosis present

## 2023-05-15 NOTE — ED Triage Notes (Signed)
Pt tripped and fell Friday night in home, landing on her right hand, hitting the staircase.  Reports pain to right 5th finger and lateral hand since.  Denies head injury.

## 2023-05-15 NOTE — ED Provider Notes (Signed)
[name] EM[facility] AT [facility] Provider Note   CSN: [id number] Arrival date & time: 05/15/23  0940     History  Chief Complaint  Patient presents with   Hand Injury    Anna [name] is a 52 y.o. female.  Patient with noncontributory past medical history presents today with complaints of right hand injury.  She states that same occurred when she tripped and fell on Friday (2 days ago).  She did not hit her head or lose consciousness.  She is not anticoagulated.  States that since then the outside portion of her right hand by her fifth digit has been giving her pain.  She originally assumed it would get better on its own but it has persisted.  She denies any other injuries or complaints.  The history is provided by the patient. No language interpreter was used.  Hand Injury      Home Medications Prior to Admission medications   Medication Sig Start Date End Date Taking? Authorizing Provider  amLODipine (NORVASC) 10 MG tablet Take 1 tablet (10 mg total) by mouth daily. 09/02/21   Donato Schultz, DO  benzonatate (TESSALON) 100 MG capsule Take 1 capsule (100 mg total) by mouth every 8 (eight) hours. 07/28/22   Renne Crigler, PA-C  calcium citrate-vitamin D (CITRACAL+D) 315-200 MG-UNIT per tablet Take 1 tablet by mouth 2 (two) times daily.    [provider]  celecoxib (CELEBREX) 200 MG capsule Take 1 capsule (200 mg total) by mouth 2 (two) times daily between meals as needed. 12/13/22   Kathryne Hitch, MD  cyanocobalamin (,VITAMIN B-12,) 1000 MCG/ML injection Inject 1 mL into the muscle every 30 (thirty) days. 09/05/17   [provider]  diclofenac Sodium (VOLTAREN) 1 % GEL Apply 2 g topically 4 (four) times daily. 12/03/22   Tegeler, Canary Brim, MD  dicyclomine (BENTYL) 20 MG tablet Take 1 tablet (20 mg total) by mouth 2 (two) times daily. 10/05/21   Gailen Shelter, PA  Fe Cbn-Fe Gluc-FA-B12-C-DSS (FERRALET 90) 90-1 MG  TABS  04/29/12   [provider]  ferrous sulfate 325 (65 FE) MG tablet Take 325 mg by mouth 2 (two) times daily with a meal.     [provider]  HYDROcodone-acetaminophen (NORCO) 5-325 MG tablet Take 1 tablet by mouth every 6 (six) hours as needed for severe pain or moderate pain. 04/24/22   Molpus, John, MD  MULTIPLE VITAMIN PO Take by mouth.    [provider]  ondansetron (ZOFRAN-ODT) 8 MG disintegrating tablet Take 1 tablet (8 mg total) by mouth every 8 (eight) hours as needed for nausea or vomiting. 04/24/22   Molpus, Jonny Ruiz, MD  pantoprazole (PROTONIX) 40 MG tablet Take 1 tablet (40 mg total) by mouth daily. 09/02/21   Seabron Spates R, DO  polyethylene glycol powder (MIRALAX) powder Take 17 g by mouth daily. Patient taking differently: Take 17 g by mouth as needed for mild constipation. 11/02/17   Palumbo, April, MD  potassium chloride (KLOR-CON) 20 MEQ packet Take 20 mEq by mouth 2 (two) times daily.     [provider]  protein supplement shake (PREMIER PROTEIN) LIQD Take 59.1 mLs (2 oz total) by mouth 4 (four) times daily -  with meals and at bedtime. 04/21/18   Sherrie George, PA-C  Thiamine HCl (VITAMIN B-1) 250 MG tablet Take 250 mg by mouth daily.    [provider]  topiramate (TOPAMAX) 200 MG tablet Take 0.5 tablets (  100 mg total) by mouth 2 (two) times daily. For migraines 09/02/21   Zola Button, Myrene Buddy R, DO  zolpidem (AMBIEN CR) 12.5 MG CR tablet TAKE 1 TABLET BY MOUTH AT BEDTIME AS NEEDED FOR SLEEP 10/05/21   Zola Button, Grayling Congress, DO  metoCLOPramide (REGLAN) 10 MG tablet Take 1 tablet (10 mg total) by mouth 2 (two) times daily as needed (headache). Patient not taking: Reported on 04/20/2018 01/02/16 09/19/19  Tomasita Crumble, MD  sucralfate (CARAFATE) 1 GM/10ML suspension Take 10 mLs (1 g total) by mouth 4 (four) times daily. Patient not taking: Reported on 04/20/2018 11/22/12 09/19/19  Nicanor Alcon, April, MD      Allergies    Nsaids, Paxil  [paroxetine], Prozac [fluoxetine], Shellfish allergy, and Tamiflu [oseltamivir phosphate]    Review of Systems   Review of Systems  Musculoskeletal:  Positive for myalgias.  All other systems reviewed and are negative.   Physical Exam Updated Vital Signs BP 118/76 (BP Location: Left Arm)   Pulse 80   Temp 98.2 F (36.8 C) (Oral)   Resp 16   LMP 02/07/2023 (Approximate)   SpO2 99%  Physical Exam Vitals and nursing note reviewed.  Constitutional:      General: She is not in acute distress.    Appearance: Normal appearance. She is normal weight. She is not ill-appearing, toxic-appearing or diaphoretic.  HENT:     Head: Normocephalic and atraumatic.  Cardiovascular:     Rate and Rhythm: Normal rate.  Pulmonary:     Effort: Pulmonary effort is normal. No respiratory distress.  Musculoskeletal:        General: Normal range of motion.     Cervical back: Normal range of motion.     Comments: TTP of the base of the right fifth finger.  No swelling or bruising noted.  ROM intact with some discomfort.  Distal pulses and sensation intact.  Good capillary refill.  No obvious deformity.  Skin:    General: Skin is warm and dry.  Neurological:     General: No focal deficit present.     Mental Status: She is alert.  Psychiatric:        Mood and Affect: Mood normal.        Behavior: Behavior normal.     ED Results / Procedures / Treatments   Labs (all labs ordered are listed, but only abnormal results are displayed) Labs Reviewed - No data to display  EKG None  Radiology DG Hand Complete Right  Result Date: 05/15/2023 CLINICAL DATA:  Pain after injury EXAM: RIGHT HAND - COMPLETE 3 VIEW COMPARISON:  None Available. FINDINGS: No acute fracture or dislocation. Preserved bone mineralization. There are some degenerative changes involving the interphalangeal joint of the thumb. 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 Electronically Signed   By: Karen Kays M.D.   On: 05/15/2023 10:11    Procedures Procedures    Medications Ordered in ED Medications - No data to display  ED Course/ Medical Decision Making/ A&P                                 Medical Decision Making Amount and/or Complexity of Data Reviewed Radiology: ordered.   Patient presents today with complaints of right hand injury 2 days ago.  She is afebrile, nontoxic-appearing, and in no acute distress reassuring vital signs.  Physical exam reveals TTP to the base of the right fifth  finger without swelling, bruising, deformity, or overlying skin changes.  ROM intact with some discomfort.  X-ray imaging obtained which has resulted and reveals degenerative changes of the ITP joint of the thumb, no acute fracture or dislocation.  I have personally reviewed and interpreted this imaging and agree with radiology interpretation.  Discussed findings with patient who is understanding and in agreement with this.  She did request a finger splint for support which I have provided for her.  Will also give referral to hand surgery for follow-up.  RICE and supportive measures with Tylenol and ibuprofen recommended and discussed. Evaluation and diagnostic testing in the emergency department does not suggest an emergent condition requiring admission or immediate intervention beyond what has been performed at this time.  Plan for discharge with close PCP follow-up.  Patient is understanding and amenable with plan, educated on red flag symptoms that would prompt immediate return.  Patient discharged in stable condition.  Final Clinical Impression(s) / ED Diagnoses Final diagnoses:  Fall, initial encounter  Right hand pain    Rx / DC Orders ED Discharge Orders     None     An After Visit Summary was printed and given to the patient.     Vear Clock 05/15/23 1048    Derwood Kaplan, MD 05/15/23 1536

## 2023-05-15 NOTE — Discharge Instructions (Signed)
As we discussed, your workup in the ER today was reassuring for acute findings.  X-ray imaging of your hand did not reveal any fracture or dislocation.  You may have sustained a small muscle strain or ligament injury that would not show up on x-ray.  We have given you a splint to wear for support which you can remove to shower and sleep do not have to wear if your pain improves.  I have also given you a referral to a hand specialist with a number to call to schedule follow-up appointment.  Please call at your earliest convenience.  I recommend in the interim that you rest, ice, compress, and elevate your hand and take Tylenol/ibuprofen as needed for pain.  Return if development of any new or worsening symptoms.

## 2023-05-15 NOTE — ED Notes (Signed)
Dc instructions reviewed with patient. Patient voiced understanding. Dc with belongings.  °

## 2023-05-22 ENCOUNTER — Encounter (HOSPITAL_BASED_OUTPATIENT_CLINIC_OR_DEPARTMENT_OTHER): Payer: Self-pay

## 2023-05-22 ENCOUNTER — Emergency Department (HOSPITAL_BASED_OUTPATIENT_CLINIC_OR_DEPARTMENT_OTHER)
Admission: EM | Admit: 2023-05-22 | Discharge: 2023-05-22 | Disposition: A | Discharge status: Home / Self Care | Payer: Medicaid Other | Attending: Emergency Medicine | Admitting: Emergency Medicine

## 2023-05-22 ENCOUNTER — Emergency Department (HOSPITAL_BASED_OUTPATIENT_CLINIC_OR_DEPARTMENT_OTHER): Payer: Medicaid Other

## 2023-05-22 DIAGNOSIS — R1084 Generalized abdominal pain: Secondary | ICD-10-CM | POA: Insufficient documentation

## 2023-05-22 DIAGNOSIS — I10 Essential (primary) hypertension: Secondary | ICD-10-CM | POA: Insufficient documentation

## 2023-05-22 DIAGNOSIS — Z79899 Other long term (current) drug therapy: Secondary | ICD-10-CM | POA: Insufficient documentation

## 2023-05-22 LAB — COMPREHENSIVE METABOLIC PANEL
ALT: 5 U/L (ref 0–44)
AST: 13 U/L — ABNORMAL LOW (ref 15–41)
Albumin: 3.6 g/dL (ref 3.5–5.0)
Alkaline Phosphatase: 80 U/L (ref 38–126)
Anion gap: 7 (ref 5–15)
BUN: 18 mg/dL (ref 6–20)
CO2: 21 mmol/L — ABNORMAL LOW (ref 22–32)
Calcium: 8.5 mg/dL — ABNORMAL LOW (ref 8.9–10.3)
Chloride: 109 mmol/L (ref 98–111)
Creatinine, Ser: 0.79 mg/dL (ref 0.44–1.00)
GFR, Estimated: >60 mL/min (ref >60)
Glucose, Bld: 134 mg/dL — ABNORMAL HIGH (ref 70–99)
Potassium: 3.8 mmol/L (ref 3.5–5.1)
Sodium: 137 mmol/L (ref 135–145)
Total Bilirubin: 0.5 mg/dL (ref 0.3–1.2)
Total Protein: 6.6 g/dL (ref 6.5–8.1)

## 2023-05-22 LAB — LIPASE, BLOOD: Lipase: 21 U/L (ref 11–51)

## 2023-05-22 LAB — CBC
HCT: 32.5 % — ABNORMAL LOW (ref 36.0–46.0)
Hemoglobin: 10.5 g/dL — ABNORMAL LOW (ref 12.0–15.0)
MCH: 28.5 pg (ref 26.0–34.0)
MCHC: 32.3 g/dL (ref 30.0–36.0)
MCV: 88.1 fL (ref 80.0–100.0)
Platelets: 209 10*3/uL (ref 150–400)
RBC: 3.69 MIL/uL — ABNORMAL LOW (ref 3.87–5.11)
RDW: 15.2 % (ref 11.5–15.5)
WBC: 9.1 10*3/uL (ref 4.0–10.5)
nRBC: 0 % (ref 0.0–0.2)

## 2023-05-22 LAB — URINALYSIS, ROUTINE W REFLEX MICROSCOPIC
Bilirubin Urine: NEGATIVE
Glucose, UA: NEGATIVE mg/dL
Ketones, ur: NEGATIVE mg/dL
Leukocytes,Ua: NEGATIVE
Nitrite: NEGATIVE
Specific Gravity, Urine: 1.027 (ref 1.005–1.030)
pH: 6 (ref 5.0–8.0)

## 2023-05-22 LAB — PREGNANCY, URINE: Preg Test, Ur: NEGATIVE

## 2023-05-22 MED ORDER — MORPHINE SULFATE (PF) 4 MG/ML IV SOLN
4.0000 mg | Freq: Once | INTRAVENOUS | Status: AC
  Administered 2023-05-22: 4 mg via INTRAVENOUS
  Filled 2023-05-22: qty 1

## 2023-05-22 MED ORDER — ONDANSETRON HCL 4 MG/2ML IJ SOLN
4.0000 mg | Freq: Once | INTRAMUSCULAR | Status: AC
  Administered 2023-05-22: 4 mg via INTRAVENOUS
  Filled 2023-05-22: qty 2

## 2023-05-22 MED ORDER — IOHEXOL 300 MG/ML  SOLN
100.0000 mL | Freq: Once | INTRAMUSCULAR | Status: AC | PRN
  Administered 2023-05-22: 100 mL via INTRAVENOUS

## 2023-05-22 NOTE — ED Triage Notes (Signed)
She tells me she awoke to find that she has right-sided abd. Discomfort radiating at times into her back. She is ambulatory and in no distress. She denies fever/n/v/d.

## 2023-05-22 NOTE — Discharge Instructions (Signed)
As we discussed, your workup in the ER today was reassuring for acute findings.  Laboratory evaluation and CT imaging did not reveal any emergent cause of your symptoms.  I recommend that you call your GI doctor to schedule follow-up appointment your earliest convenience and see your primary care doctor as well.  Return if development of any new or worsening symptoms.

## 2023-05-22 NOTE — ED Provider Notes (Signed)
[place] EMERGENCY DEPARTMENT AT [facility] Provider Note   CSN: [id number] Arrival date & time: 05/22/23  1333     History  Chief Complaint  Patient presents with   Abdominal Pain    Anna [name] is a 52 y.o. female.  Patient with history of gastric bypass, GERD, hypertension, lysis of adhesions in 2019 presents today with complaints of abdominal pain.  She states that same began originally yesterday evening and is worse today.  She states that the pain is in her right lower quadrant and radiates throughout her abdomen.  She originally thought she might be constipated and so she took some MiraLAX and had several bouts of diarrhea which did not improve her pain.  She notes associated loss of appetite.  Denies any history of pain similar to this previously.  No fevers or chills.  No nausea or vomiting.  Denies cough or congestion.  No sick contacts.  Denies any urinary symptoms.  The history is provided by the patient. No language interpreter was used.  Abdominal Pain      Home Medications Prior to Admission medications   Medication Sig Start Date End Date Taking? Authorizing Provider  amLODipine (NORVASC) 10 MG tablet Take 1 tablet (10 mg total) by mouth daily. 09/02/21   Donato Schultz, DO  benzonatate (TESSALON) 100 MG capsule Take 1 capsule (100 mg total) by mouth every 8 (eight) hours. 07/28/22   Renne Crigler, PA-C  calcium citrate-vitamin D (CITRACAL+D) 315-200 MG-UNIT per tablet Take 1 tablet by mouth 2 (two) times daily.    [provider]  celecoxib (CELEBREX) 200 MG capsule Take 1 capsule (200 mg total) by mouth 2 (two) times daily between meals as needed. 12/13/22   Kathryne Hitch, MD  cyanocobalamin (,VITAMIN B-12,) 1000 MCG/ML injection Inject 1 mL into the muscle every 30 (thirty) days. 09/05/17   [provider]  diclofenac Sodium (VOLTAREN) 1 % GEL Apply 2 g topically 4 (four) times daily. 12/03/22   Tegeler,  Canary Brim, MD  dicyclomine (BENTYL) 20 MG tablet Take 1 tablet (20 mg total) by mouth 2 (two) times daily. 10/05/21   Gailen Shelter, PA  Fe Cbn-Fe Gluc-FA-B12-C-DSS (FERRALET 90) 90-1 MG TABS  04/29/12   [provider]  ferrous sulfate 325 (65 FE) MG tablet Take 325 mg by mouth 2 (two) times daily with a meal.     [provider]  HYDROcodone-acetaminophen (NORCO) 5-325 MG tablet Take 1 tablet by mouth every 6 (six) hours as needed for severe pain or moderate pain. 04/24/22   Molpus, John, MD  MULTIPLE VITAMIN PO Take by mouth.    [provider]  ondansetron (ZOFRAN-ODT) 8 MG disintegrating tablet Take 1 tablet (8 mg total) by mouth every 8 (eight) hours as needed for nausea or vomiting. 04/24/22   Molpus, Jonny Ruiz, MD  pantoprazole (PROTONIX) 40 MG tablet Take 1 tablet (40 mg total) by mouth daily. 09/02/21   Seabron Spates R, DO  polyethylene glycol powder (MIRALAX) powder Take 17 g by mouth daily. Patient taking differently: Take 17 g by mouth as needed for mild constipation. 11/02/17   Palumbo, April, MD  potassium chloride (KLOR-CON) 20 MEQ packet Take 20 mEq by mouth 2 (two) times daily.     [provider]  protein supplement shake (PREMIER PROTEIN) LIQD Take 59.1 mLs (2 oz total) by mouth 4 (four) times daily -  with meals and at bedtime. 04/21/18   Sherrie George, PA-C  Thiamine HCl (VITAMIN B-1) 250 MG tablet Take 250 mg by mouth daily.    [provider]  topiramate (TOPAMAX) 200 MG tablet Take 0.5 tablets (100 mg total) by mouth 2 (two) times daily. For migraines 09/02/21   Zola Button, Myrene Buddy R, DO  zolpidem (AMBIEN CR) 12.5 MG CR tablet TAKE 1 TABLET BY MOUTH AT BEDTIME AS NEEDED FOR SLEEP 10/05/21   Zola Button, Grayling Congress, DO  metoCLOPramide (REGLAN) 10 MG tablet Take 1 tablet (10 mg total) by mouth 2 (two) times daily as needed (headache). Patient not taking: Reported on 04/20/2018 01/02/16 09/19/19  Tomasita Crumble, MD  sucralfate (CARAFATE)  1 GM/10ML suspension Take 10 mLs (1 g total) by mouth 4 (four) times daily. Patient not taking: Reported on 04/20/2018 11/22/12 09/19/19  Nicanor Alcon, April, MD      Allergies    Nsaids, Paxil [paroxetine], Prozac [fluoxetine], Shellfish allergy, and Tamiflu [oseltamivir phosphate]    Review of Systems   Review of Systems  Gastrointestinal:  Positive for abdominal pain.  All other systems reviewed and are negative.   Physical Exam Updated Vital Signs BP 101/68 (BP Location: Right Arm)   Pulse 89   Temp 97.8 F (36.6 C) (Oral)   Resp 16   LMP  (LMP Unknown)   SpO2 100%  Physical Exam Vitals and nursing note reviewed.  Constitutional:      General: She is not in acute distress.    Appearance: Normal appearance. She is normal weight. She is not ill-appearing, toxic-appearing or diaphoretic.  HENT:     Head: Normocephalic and atraumatic.  Cardiovascular:     Rate and Rhythm: Normal rate.  Pulmonary:     Effort: Pulmonary effort is normal. No respiratory distress.  Abdominal:     General: Abdomen is flat.     Palpations: Abdomen is soft.     Tenderness: There is generalized abdominal tenderness and tenderness in the right lower quadrant.  Musculoskeletal:        General: Normal range of motion.     Cervical back: Normal range of motion.  Skin:    General: Skin is warm and dry.  Neurological:     General: No focal deficit present.     Mental Status: She is alert.  Psychiatric:        Mood and Affect: Mood normal.        Behavior: Behavior normal.    ED Results / Procedures / Treatments   Labs (all labs ordered are listed, but only abnormal results are displayed) Labs Reviewed  URINALYSIS, ROUTINE W REFLEX MICROSCOPIC - Abnormal; Notable for the following components:      Result Value   APPearance HAZY (*)    Hgb urine dipstick TRACE (*)    Protein, ur TRACE (*)    Bacteria, UA RARE (*)    All other components within normal limits  PREGNANCY, URINE  LIPASE, BLOOD   COMPREHENSIVE METABOLIC PANEL  CBC    EKG None  Radiology No results found.  Procedures Procedures    Medications Ordered in ED Medications  morphine (PF) 4 MG/ML injection 4 mg (has no administration in time range)    ED Course/ Medical Decision Making/ A&P                                 Medical Decision Making Amount and/or Complexity of Data Reviewed Labs: ordered. Radiology: ordered.  Risk Prescription drug management.  This patient is a 52 y.o. female who presents to the ED for concern of abdominal pain, this involves an extensive number of treatment options, and is a complaint that carries with it a high risk of complications and morbidity. The emergent differential diagnosis prior to evaluation includes, but is not limited to,  The differential diagnosis for generalized abdominal pain includes, but is not limited to AAA, gastroenteritis, appendicitis, Bowel obstruction, Bowel perforation. Gastroparesis, DKA, Hernia, Inflammatory bowel disease, mesenteric ischemia, pancreatitis, peritonitis SBP, volvulus.   This is not an exhaustive differential.   Past Medical History / Co-morbidities / Social History:  has a past medical history of Anemia, Asthma, Chronic abdominal pain, GERD (gastroesophageal reflux disease), Hypertension, Insomnia, Migraine, and Thyroid disease.  Additional history: Chart reviewed. Pertinent results include: patient seen previously for abdominal complaints with negative findings after labs and imaging  Physical Exam: Physical exam performed. The pertinent findings include: RLQ TTP with generalized abdominal tenderness throughout without rebound or guarding  Lab Tests: I ordered, and personally interpreted labs.  The pertinent results include:  hgb 10.5 unchanged from previous. Bicarb 21, glucose 134. UA noninfectious   Imaging Studies: I ordered imaging studies including CT abdomen pelvis. I independently visualized and interpreted  imaging which showed   1. No acute findings in the abdomen or pelvis.   I agree with the radiologist interpretation.    Medications: I ordered medication including morphine, zofran  for pain. Reevaluation of the patient after these medicines showed that the patient improved. I have reviewed the patients home medicines and have made adjustments as needed.  Disposition: After consideration of the diagnostic results and the patients response to treatment, I feel that emergency department workup does not suggest an emergent condition requiring admission or immediate intervention beyond what has been performed at this time. The plan is: Discharge with close outpatient follow-up and return precautions.  After medication her pain has improved.  Her workup is benign.  Pain is likely chronic from adhesions from her surgeries.  Recommend that she follow-up closely with her GI doctor.  She is able to eat and drink without any nausea or vomiting.  Evaluation and diagnostic testing in the emergency department does not suggest an emergent condition requiring admission or immediate intervention beyond what has been performed at this time.  Plan for discharge with close PCP follow-up.  Patient is understanding and amenable with plan, educated on red flag symptoms that would prompt immediate return.  Patient discharged in stable condition.  Final Clinical Impression(s) / ED Diagnoses Final diagnoses:  Generalized abdominal pain    Rx / DC Orders ED Discharge Orders     None     An After Visit Summary was printed and given to the patient.     Silva Bandy, PA-C 05/22/23 1736    Laurence Spates, MD 05/25/23 [phone]

## 2023-05-22 NOTE — ED Notes (Signed)
Discharge paperwork given and verbally understood. 

## 2024-01-17 ENCOUNTER — Emergency Department (HOSPITAL_BASED_OUTPATIENT_CLINIC_OR_DEPARTMENT_OTHER)

## 2024-01-17 ENCOUNTER — Other Ambulatory Visit: Payer: Self-pay

## 2024-01-17 ENCOUNTER — Emergency Department (HOSPITAL_BASED_OUTPATIENT_CLINIC_OR_DEPARTMENT_OTHER)
Admission: EM | Admit: 2024-01-17 | Discharge: 2024-01-17 | Disposition: A | Discharge status: Home / Self Care | Attending: Emergency Medicine | Admitting: Emergency Medicine

## 2024-01-17 DIAGNOSIS — Z79899 Other long term (current) drug therapy: Secondary | ICD-10-CM | POA: Insufficient documentation

## 2024-01-17 DIAGNOSIS — M5441 Lumbago with sciatica, right side: Secondary | ICD-10-CM | POA: Insufficient documentation

## 2024-01-17 DIAGNOSIS — M5442 Lumbago with sciatica, left side: Secondary | ICD-10-CM | POA: Insufficient documentation

## 2024-01-17 DIAGNOSIS — M5431 Sciatica, right side: Secondary | ICD-10-CM

## 2024-01-17 DIAGNOSIS — M545 Low back pain, unspecified: Secondary | ICD-10-CM | POA: Diagnosis present

## 2024-01-17 DIAGNOSIS — I10 Essential (primary) hypertension: Secondary | ICD-10-CM | POA: Insufficient documentation

## 2024-01-17 DIAGNOSIS — J45909 Unspecified asthma, uncomplicated: Secondary | ICD-10-CM | POA: Insufficient documentation

## 2024-01-17 DIAGNOSIS — M79604 Pain in right leg: Secondary | ICD-10-CM

## 2024-01-17 LAB — COMPREHENSIVE METABOLIC PANEL WITH GFR
ALT: <5 U/L (ref 0–44)
AST: 14 U/L — ABNORMAL LOW (ref 15–41)
Albumin: 3.7 g/dL (ref 3.5–5.0)
Alkaline Phosphatase: 125 U/L (ref 38–126)
Anion gap: 12 (ref 5–15)
BUN: 18 mg/dL (ref 6–20)
CO2: 20 mmol/L — ABNORMAL LOW (ref 22–32)
Calcium: 9.2 mg/dL (ref 8.9–10.3)
Chloride: 111 mmol/L (ref 98–111)
Creatinine, Ser: 0.76 mg/dL (ref 0.44–1.00)
GFR, Estimated: >60 mL/min (ref >60)
Glucose, Bld: 95 mg/dL (ref 70–99)
Potassium: 4.1 mmol/L (ref 3.5–5.1)
Sodium: 143 mmol/L (ref 135–145)
Total Bilirubin: <0.2 mg/dL (ref 0.0–1.2)
Total Protein: 6.9 g/dL (ref 6.5–8.1)

## 2024-01-17 LAB — CBC WITH DIFFERENTIAL/PLATELET
Abs Immature Granulocytes: 0.01 10*3/uL (ref 0.00–0.07)
Basophils Absolute: 0 10*3/uL (ref 0.0–0.1)
Basophils Relative: 0 %
Eosinophils Absolute: 0.1 10*3/uL (ref 0.0–0.5)
Eosinophils Relative: 1 %
HCT: 31.4 % — ABNORMAL LOW (ref 36.0–46.0)
Hemoglobin: 9.8 g/dL — ABNORMAL LOW (ref 12.0–15.0)
Immature Granulocytes: 0 %
Lymphocytes Relative: 59 %
Lymphs Abs: 2.9 10*3/uL (ref 0.7–4.0)
MCH: 27.5 pg (ref 26.0–34.0)
MCHC: 31.2 g/dL (ref 30.0–36.0)
MCV: 88.2 fL (ref 80.0–100.0)
Monocytes Absolute: 0.4 10*3/uL (ref 0.1–1.0)
Monocytes Relative: 9 %
Neutro Abs: 1.6 10*3/uL — ABNORMAL LOW (ref 1.7–7.7)
Neutrophils Relative %: 31 %
Platelets: 236 10*3/uL (ref 150–400)
RBC: 3.56 MIL/uL — ABNORMAL LOW (ref 3.87–5.11)
RDW: 16.6 % — ABNORMAL HIGH (ref 11.5–15.5)
WBC: 5 10*3/uL (ref 4.0–10.5)
nRBC: 0 % (ref 0.0–0.2)

## 2024-01-17 LAB — PRO BRAIN NATRIURETIC PEPTIDE: Pro Brain Natriuretic Peptide: 74.4 pg/mL (ref <300.0)

## 2024-01-17 MED ORDER — METHYLPREDNISOLONE 4 MG PO TBPK: ORAL_TABLET | ORAL | 0 refills | Status: AC

## 2024-01-17 MED ORDER — KETOROLAC TROMETHAMINE 30 MG/ML IJ SOLN
30.0000 mg | Freq: Once | INTRAMUSCULAR | Status: AC
  Administered 2024-01-17: 30 mg via INTRAVENOUS
  Filled 2024-01-17: qty 1

## 2024-01-17 MED ORDER — PROMETHAZINE HCL 25 MG PO TABS
25.0000 mg | ORAL_TABLET | Freq: Once | ORAL | Status: AC
  Administered 2024-01-17: 25 mg via ORAL
  Filled 2024-01-17: qty 1

## 2024-01-17 MED ORDER — GABAPENTIN 100 MG PO CAPS: 100.0000 mg | ORAL_CAPSULE | Freq: Three times a day (TID) | ORAL | 0 refills | Status: AC

## 2024-01-17 MED ORDER — LIDOCAINE 5 % EX PTCH: 1.0000 | MEDICATED_PATCH | CUTANEOUS | 0 refills | Status: AC

## 2024-01-17 MED ORDER — OXYCODONE HCL 5 MG PO TABS: 5.0000 mg | ORAL_TABLET | ORAL | 0 refills | Status: AC | PRN

## 2024-01-17 MED ORDER — OXYCODONE HCL 5 MG PO TABS
5.0000 mg | ORAL_TABLET | Freq: Once | ORAL | Status: AC
  Administered 2024-01-17: 5 mg via ORAL
  Filled 2024-01-17: qty 1

## 2024-01-17 MED ORDER — KETOROLAC TROMETHAMINE 30 MG/ML IJ SOLN
30.0000 mg | Freq: Once | INTRAMUSCULAR | Status: DC
  Filled 2024-01-17: qty 1

## 2024-01-17 NOTE — ED Provider Notes (Signed)
 [place] EMERGENCY DEPARTMENT AT [facility] Provider Note   CSN: [id number] Arrival date & time: 01/17/24  1454     History {Add pertinent medical, surgical, social history, OB history to HPI:1} Chief Complaint  Patient presents with   Leg Pain    Anna [name] is a 53 y.o. female.  HPI     Saturday began to have pain back of legs bilaterally and down back of legs, constant pain Nothing makes it better or worse Told her physician No falls He was worried about blood clot and sent her here  No abdominal pain Has had low back pain, into left hip then radiates down the left leg to left ankle, right side radiates Some on the right but more on the left  Hurts to sit and lay down  Back pain when laying down No numbness or weakness, just pain No loss of control of bowel or bladder No cancer hx, no hx of IVDU Dr wanted to evaluate liver numbers--looking into that Bruising to right leg but don't remember injury No fevers No smoking etoh or other drugs    Past Medical History:  Diagnosis Date   Anemia    Asthma    Chronic abdominal pain    GERD (gastroesophageal reflux disease)    Hypertension    Insomnia    Migraine    Thyroid disease     Home Medications Prior to Admission medications   Medication Sig Start Date End Date Taking? Authorizing Provider  amLODipine  (NORVASC ) 10 MG tablet Take 1 tablet (10 mg total) by mouth daily. 09/02/21   Estill Hemming, DO  benzonatate  (TESSALON ) 100 MG capsule Take 1 capsule (100 mg total) by mouth every 8 (eight) hours. 07/28/22   Lyna Sandhoff, PA-C  calcium citrate-vitamin D (CITRACAL+D) 315-200 MG-UNIT per tablet Take 1 tablet by mouth 2 (two) times daily.    [provider]  celecoxib  (CELEBREX ) 200 MG capsule Take 1 capsule (200 mg total) by mouth 2 (two) times daily between meals as needed. 12/13/22   Arnie Lao, MD  cyanocobalamin (,VITAMIN B-12,) 1000 MCG/ML injection Inject 1  mL into the muscle every 30 (thirty) days. 09/05/17   [provider]  diclofenac  Sodium (VOLTAREN ) 1 % GEL Apply 2 g topically 4 (four) times daily. 12/03/22   Tegeler, Marine Sia, MD  dicyclomine  (BENTYL ) 20 MG tablet Take 1 tablet (20 mg total) by mouth 2 (two) times daily. 10/05/21   Coretta Dexter, PA  Fe Cbn-Fe Gluc-FA-B12-C-DSS (FERRALET 90) 90-1 MG TABS  04/29/12   [provider]  ferrous sulfate  325 (65 FE) MG tablet Take 325 mg by mouth 2 (two) times daily with a meal.     [provider]  HYDROcodone -acetaminophen  (NORCO) 5-325 MG tablet Take 1 tablet by mouth every 6 (six) hours as needed for severe pain or moderate pain. 04/24/22   Molpus, John, MD  MULTIPLE VITAMIN PO Take by mouth.    [provider]  ondansetron  (ZOFRAN -ODT) 8 MG disintegrating tablet Take 1 tablet (8 mg total) by mouth every 8 (eight) hours as needed for nausea or vomiting. 04/24/22   Molpus, Autry Legions, MD  pantoprazole  (PROTONIX ) 40 MG tablet Take 1 tablet (40 mg total) by mouth daily. 09/02/21   Lowne Chase, Yvonne R, DO  polyethylene glycol powder (MIRALAX ) powder Take 17 g by mouth daily. Patient taking differently: Take 17 g by mouth as needed for mild constipation. 11/02/17   Palumbo, April, MD  potassium  chloride (KLOR-CON ) 20 MEQ packet Take 20 mEq by mouth 2 (two) times daily.     [provider]  protein supplement shake (PREMIER PROTEIN) LIQD Take 59.1 mLs (2 oz total) by mouth 4 (four) times daily -  with meals and at bedtime. 04/21/18   Monetta Angst, PA-C  Thiamine  HCl (VITAMIN B-1) 250 MG tablet Take 250 mg by mouth daily.    [provider]  topiramate  (TOPAMAX ) 200 MG tablet Take 0.5 tablets (100 mg total) by mouth 2 (two) times daily. For migraines 09/02/21   Crecencio Dodge, Adel Holt R, DO  zolpidem  (AMBIEN  CR) 12.5 MG CR tablet TAKE 1 TABLET BY MOUTH AT BEDTIME AS NEEDED FOR SLEEP 10/05/21   Crecencio Dodge, Candida Chalk, DO  metoCLOPramide  (REGLAN ) 10 MG tablet  Take 1 tablet (10 mg total) by mouth 2 (two) times daily as needed (headache). Patient not taking: Reported on 04/20/2018 01/02/16 09/19/19  Aisha Hove, MD  sucralfate  (CARAFATE ) 1 GM/10ML suspension Take 10 mLs (1 g total) by mouth 4 (four) times daily. Patient not taking: Reported on 04/20/2018 11/22/12 09/19/19  Palumbo, April, MD      Allergies    Nsaids, Paxil [paroxetine], Prozac [fluoxetine], Shellfish allergy, and Tamiflu [oseltamivir phosphate]    Review of Systems   Review of Systems  Physical Exam Updated Vital Signs BP 118/81   Pulse 78   Temp 97.9 F (36.6 C)   Resp 16   SpO2 100%  Physical Exam  ED Results / Procedures / Treatments   Labs (all labs ordered are listed, but only abnormal results are displayed) Labs Reviewed - No data to display  EKG None  Radiology No results found.  Procedures Procedures  {Document cardiac monitor, telemetry assessment procedure when appropriate:1}  Medications Ordered in ED Medications - No data to display  ED Course/ Medical Decision Making/ A&P   {   Click here for ABCD2, HEART and other calculatorsREFRESH Note before signing :1}                              Medical Decision Making  ***  {Document critical care time when appropriate:1} {Document review of labs and clinical decision tools ie heart score, Chads2Vasc2 etc:1}  {Document your independent review of radiology images, and any outside records:1} {Document your discussion with family members, caretakers, and with consultants:1} {Document social determinants of health affecting pt's care:1} {Document your decision making why or why not admission, treatments were needed:1} Final Clinical Impression(s) / ED Diagnoses Final diagnoses:  None    Rx / DC Orders ED Discharge Orders     None

## 2024-01-17 NOTE — ED Notes (Signed)
 Endorses leg swelling at this time.

## 2024-01-17 NOTE — ED Notes (Addendum)
 Pt approaches this RN at nurses' station. Wanting to see MD-notified once more. This RN explains MD is busy and the department is busy. No distress noted. Vitals obtained. Methods for reporting complaint given to patient.

## 2024-01-17 NOTE — ED Triage Notes (Addendum)
 Reports bilateral leg pain all weekend. At times goes into hip. Hurt regardless of activity. Endorses lower back pain as well. Denies urinary symptoms. No swelling in legs.

## 2024-01-30 ENCOUNTER — Encounter (HOSPITAL_BASED_OUTPATIENT_CLINIC_OR_DEPARTMENT_OTHER): Payer: Self-pay

## 2024-01-30 ENCOUNTER — Emergency Department (HOSPITAL_BASED_OUTPATIENT_CLINIC_OR_DEPARTMENT_OTHER)

## 2024-01-30 ENCOUNTER — Emergency Department (HOSPITAL_BASED_OUTPATIENT_CLINIC_OR_DEPARTMENT_OTHER)
Admission: EM | Admit: 2024-01-30 | Discharge: 2024-01-30 | Disposition: A | Discharge status: Home / Self Care | Attending: Emergency Medicine | Admitting: Emergency Medicine

## 2024-01-30 ENCOUNTER — Other Ambulatory Visit: Payer: Self-pay

## 2024-01-30 DIAGNOSIS — Z79899 Other long term (current) drug therapy: Secondary | ICD-10-CM | POA: Insufficient documentation

## 2024-01-30 DIAGNOSIS — M7981 Nontraumatic hematoma of soft tissue: Secondary | ICD-10-CM | POA: Insufficient documentation

## 2024-01-30 DIAGNOSIS — M79651 Pain in right thigh: Secondary | ICD-10-CM | POA: Diagnosis present

## 2024-01-30 DIAGNOSIS — T148XXA Other injury of unspecified body region, initial encounter: Secondary | ICD-10-CM

## 2024-01-30 DIAGNOSIS — M7918 Myalgia, other site: Secondary | ICD-10-CM | POA: Insufficient documentation

## 2024-01-30 DIAGNOSIS — M79604 Pain in right leg: Secondary | ICD-10-CM

## 2024-01-30 DIAGNOSIS — R748 Abnormal levels of other serum enzymes: Secondary | ICD-10-CM | POA: Diagnosis not present

## 2024-01-30 DIAGNOSIS — M791 Myalgia, unspecified site: Secondary | ICD-10-CM

## 2024-01-30 LAB — COMPREHENSIVE METABOLIC PANEL WITH GFR
ALT: <5 U/L (ref 0–44)
AST: 15 U/L (ref 15–41)
Albumin: 3.7 g/dL (ref 3.5–5.0)
Alkaline Phosphatase: 123 U/L (ref 38–126)
Anion gap: 10 (ref 5–15)
BUN: 22 mg/dL — ABNORMAL HIGH (ref 6–20)
CO2: 19 mmol/L — ABNORMAL LOW (ref 22–32)
Calcium: 8.6 mg/dL — ABNORMAL LOW (ref 8.9–10.3)
Chloride: 112 mmol/L — ABNORMAL HIGH (ref 98–111)
Creatinine, Ser: 0.7 mg/dL (ref 0.44–1.00)
GFR, Estimated: >60 mL/min (ref >60)
Glucose, Bld: 96 mg/dL (ref 70–99)
Potassium: 4 mmol/L (ref 3.5–5.1)
Sodium: 141 mmol/L (ref 135–145)
Total Bilirubin: <0.2 mg/dL (ref 0.0–1.2)
Total Protein: 6.7 g/dL (ref 6.5–8.1)

## 2024-01-30 LAB — CBC WITH DIFFERENTIAL/PLATELET
Abs Immature Granulocytes: 0.01 10*3/uL (ref 0.00–0.07)
Basophils Absolute: 0 10*3/uL (ref 0.0–0.1)
Basophils Relative: 0 %
Eosinophils Absolute: 0.1 10*3/uL (ref 0.0–0.5)
Eosinophils Relative: 2 %
HCT: 30.1 % — ABNORMAL LOW (ref 36.0–46.0)
Hemoglobin: 9.8 g/dL — ABNORMAL LOW (ref 12.0–15.0)
Immature Granulocytes: 0 %
Lymphocytes Relative: 54 %
Lymphs Abs: 2.4 10*3/uL (ref 0.7–4.0)
MCH: 28.1 pg (ref 26.0–34.0)
MCHC: 32.6 g/dL (ref 30.0–36.0)
MCV: 86.2 fL (ref 80.0–100.0)
Monocytes Absolute: 0.4 10*3/uL (ref 0.1–1.0)
Monocytes Relative: 10 %
Neutro Abs: 1.5 10*3/uL — ABNORMAL LOW (ref 1.7–7.7)
Neutrophils Relative %: 34 %
Platelets: 261 10*3/uL (ref 150–400)
RBC: 3.49 MIL/uL — ABNORMAL LOW (ref 3.87–5.11)
RDW: 16.4 % — ABNORMAL HIGH (ref 11.5–15.5)
WBC: 4.5 10*3/uL (ref 4.0–10.5)
nRBC: 0 % (ref 0.0–0.2)

## 2024-01-30 LAB — SEDIMENTATION RATE: Sed Rate: 20 mm/h (ref 0–22)

## 2024-01-30 LAB — CK: Total CK: 239 U/L — ABNORMAL HIGH (ref 38–234)

## 2024-01-30 NOTE — Discharge Instructions (Signed)
 1.  You have a mild elevation in an enzyme called your creatinine kinase.  This is usually from muscle.  There are many different reasons this can be elevated from simply due to exercise to inflammatory and autoimmune conditions,  Follow-up with your doctor to recheck this and make sure that this is going back to the normal range.. 2.  You have no sign of any blood clots in the veins of the legs.  You have had 2 ultrasounds and they are both normal.  Also, your exam and areas of bruising are not associated with blood clots. 3.  Return to the emergency department if you have areas of significant swelling, pain or other concerning changes.

## 2024-01-30 NOTE — ED Triage Notes (Signed)
 Pt presents with R leg pain with bruising and multiple nodules. Pt denies trauma. Pt referred to ED by her PCP to r/o DVT. Pt was seen 2 weeks ago for similar complaints in bilateral LE and had negative DVT studies at that time. Denies CP or ShOB.

## 2024-01-30 NOTE — ED Provider Notes (Signed)
 [facility] Provider Note   CSN: [id number] Arrival date & time: 01/30/24  1807     Patient presents with: Leg Pain   Anna [name] is a 53 y.o. female.   HPI Patient reports she has been having pain in her legs for several weeks.  She reports a few weeks ago she had a lot of pain in both of her upper legs and the thighs and the front and the back.  She had evaluation and at one point was diagnosed with sciatica.  She reports that that has improved and seems to be getting better.  At that time she also had pain in her lower legs and had bilateral DVT studies done which were negative.  She reports that she has had an area now of bruising on her right lower leg and some feeling of a nodule in the leg which again generated concern for possible blood clots.  Patient reports that she spoke with her PCP and was instructed to come back to the emergency department for another evaluation for DVT.  Patient denies has had any fevers or chills.  She has not had any general myalgias of the upper body.  No rashes.  No acute illnesses that she recalls.  Patient denies heavy exercise or outdoor activity.    Prior to Admission medications   Medication Sig Start Date End Date Taking? Authorizing Provider  amLODipine  (NORVASC ) 10 MG tablet Take 1 tablet (10 mg total) by mouth daily. 09/02/21   Lowne Chase, Yvonne R, DO  benzonatate  (TESSALON ) 100 MG capsule Take 1 capsule (100 mg total) by mouth every 8 (eight) hours. 07/28/22   Desiderio Chew, PA-C  calcium citrate-vitamin D (CITRACAL+D) 315-200 MG-UNIT per tablet Take 1 tablet by mouth 2 (two) times daily.    [provider]  celecoxib  (CELEBREX ) 200 MG capsule Take 1 capsule (200 mg total) by mouth 2 (two) times daily between meals as needed. 12/13/22   Vernetta Lonni GRADE, MD  cyanocobalamin (,VITAMIN B-12,) 1000 MCG/ML injection Inject 1 mL into the muscle every 30 (thirty) days. 09/05/17    [provider]  diclofenac  Sodium (VOLTAREN ) 1 % GEL Apply 2 g topically 4 (four) times daily. 12/03/22   Tegeler, Lonni PARAS, MD  dicyclomine  (BENTYL ) 20 MG tablet Take 1 tablet (20 mg total) by mouth 2 (two) times daily. 10/05/21   Neldon Hamp RAMAN, PA  Fe Cbn-Fe Gluc-FA-B12-C-DSS (FERRALET 90) 90-1 MG TABS  04/29/12   [provider]  ferrous sulfate  325 (65 FE) MG tablet Take 325 mg by mouth 2 (two) times daily with a meal.     [provider]  gabapentin  (NEURONTIN ) 100 MG capsule Take 1 capsule (100 mg total) by mouth 3 (three) times daily. 01/17/24 02/16/24  Dreama Longs, MD  lidocaine  (LIDODERM ) 5 % Place 1 patch onto the skin daily. Remove & Discard patch within 12 hours or as directed by MD 01/17/24   Dreama Longs, MD  methylPREDNISolone  (MEDROL  DOSEPAK) 4 MG TBPK tablet See instructions 01/17/24   Dreama Longs, MD  MULTIPLE VITAMIN PO Take by mouth.    [provider]  ondansetron  (ZOFRAN -ODT) 8 MG disintegrating tablet Take 1 tablet (8 mg total) by mouth every 8 (eight) hours as needed for nausea or vomiting. 04/24/22   Molpus, Norleen, MD  oxyCODONE  (ROXICODONE ) 5 MG immediate release tablet Take 1 tablet (5 mg total) by mouth every 4 (four) hours as needed for severe pain (pain score  7-10). 01/17/24   Dreama Longs, MD  pantoprazole  (PROTONIX ) 40 MG tablet Take 1 tablet (40 mg total) by mouth daily. 09/02/21   Lowne Chase, Yvonne R, DO  polyethylene glycol powder (MIRALAX ) powder Take 17 g by mouth daily. Patient taking differently: Take 17 g by mouth as needed for mild constipation. 11/02/17   Palumbo, April, MD  potassium chloride  (KLOR-CON ) 20 MEQ packet Take 20 mEq by mouth 2 (two) times daily.     [provider]  protein supplement shake (PREMIER PROTEIN) LIQD Take 59.1 mLs (2 oz total) by mouth 4 (four) times daily -  with meals and at bedtime. 04/21/18   Tonnie George, PA-C  Thiamine  HCl (VITAMIN B-1) 250 MG tablet Take 250 mg  by mouth daily.    [provider]  topiramate  (TOPAMAX ) 200 MG tablet Take 0.5 tablets (100 mg total) by mouth 2 (two) times daily. For migraines 09/02/21   Antonio Meth, Jamee R, DO  zolpidem  (AMBIEN  CR) 12.5 MG CR tablet TAKE 1 TABLET BY MOUTH AT BEDTIME AS NEEDED FOR SLEEP 10/05/21   Antonio Meth, Yvonne R, DO  metoCLOPramide  (REGLAN ) 10 MG tablet Take 1 tablet (10 mg total) by mouth 2 (two) times daily as needed (headache). Patient not taking: Reported on 04/20/2018 01/02/16 09/19/19  Carlota Day, MD  sucralfate  (CARAFATE ) 1 GM/10ML suspension Take 10 mLs (1 g total) by mouth 4 (four) times daily. Patient not taking: Reported on 04/20/2018 11/22/12 09/19/19  Palumbo, April, MD    Allergies: Nsaids, Paxil [paroxetine], Prozac [fluoxetine], Shellfish allergy, and Tamiflu [oseltamivir phosphate]    Review of Systems  Updated Vital Signs BP 120/76 (BP Location: Right Arm)   Pulse 69   Temp 97.9 F (36.6 C)   Resp 16   Ht 5' 6 (1.676 m)   Wt 75.8 kg   LMP  (LMP Unknown)   SpO2 100%   BMI 26.95 kg/m   Physical Exam Constitutional:      Comments: Alert nontoxic well in appearance.  Well-nourished well-developed.  HENT:     Head: Normocephalic and atraumatic.     Mouth/Throat:     Pharynx: Oropharynx is clear.   Eyes:     Extraocular Movements: Extraocular movements intact.     Conjunctiva/sclera: Conjunctivae normal.    Cardiovascular:     Rate and Rhythm: Normal rate and regular rhythm.  Pulmonary:     Effort: Pulmonary effort is normal.     Breath sounds: Normal breath sounds.  Abdominal:     General: There is no distension.     Palpations: Abdomen is soft.     Tenderness: There is no abdominal tenderness.   Musculoskeletal:        General: Tenderness and signs of injury present. No swelling or deformity. Normal range of motion.     Right lower leg: No edema.     Left lower leg: No edema.     Comments: Patient has no peripheral edema.  The calves are soft and  pliable.  Soft tissues of the thighs are not tender to palpation.  And has a ecchymotic bruise to the lateral right lower leg.  There is no erythema.  This appears consistent with a typical bruise from minor injury.  No significant palpable nodules of the legs.  Skin condition overall is excellent.  Feet are warm and dry.  Distal pulses are 2+.   Skin:    General: Skin is warm and dry.   Neurological:     General: No  focal deficit present.     Mental Status: She is oriented to person, place, and time.     Motor: No weakness.     Coordination: Coordination normal.   Psychiatric:        Mood and Affect: Mood normal.     (all labs ordered are listed, but only abnormal results are displayed) Labs Reviewed  COMPREHENSIVE METABOLIC PANEL WITH GFR - Abnormal; Notable for the following components:      Result Value   Chloride 112 (*)    CO2 19 (*)    BUN 22 (*)    Calcium 8.6 (*)    All other components within normal limits  CBC WITH DIFFERENTIAL/PLATELET - Abnormal; Notable for the following components:   RBC 3.49 (*)    Hemoglobin 9.8 (*)    HCT 30.1 (*)    RDW 16.4 (*)    Neutro Abs 1.5 (*)    All other components within normal limits  CK - Abnormal; Notable for the following components:   Total CK 239 (*)    All other components within normal limits  SEDIMENTATION RATE    EKG: None  Radiology: US  Venous Img Lower Unilateral Right Result Date: 01/30/2024 CLINICAL DATA:  Lateral right calf pain with knots and bruising. EXAM: RIGHT LOWER EXTREMITY VENOUS DOPPLER ULTRASOUND TECHNIQUE: Gray-scale sonography with graded compression, as well as color Doppler and duplex ultrasound were performed to evaluate the lower extremity deep venous systems from the level of the common femoral vein and including the common femoral, femoral, profunda femoral, popliteal and calf veins including the posterior tibial, peroneal and gastrocnemius veins when visible. The superficial great saphenous vein  was also interrogated. Spectral Doppler was utilized to evaluate flow at rest and with distal augmentation maneuvers in the common femoral, femoral and popliteal veins. COMPARISON:  None Available. FINDINGS: Contralateral Common Femoral Vein: Respiratory phasicity is normal and symmetric with the symptomatic side. No evidence of thrombus. Normal compressibility. Common Femoral Vein: No evidence of thrombus. Normal compressibility, respiratory phasicity and response to augmentation. Saphenofemoral Junction: No evidence of thrombus. Normal compressibility and flow on color Doppler imaging. Profunda Femoral Vein: No evidence of thrombus. Normal compressibility and flow on color Doppler imaging. Femoral Vein: No evidence of thrombus. Normal compressibility, respiratory phasicity and response to augmentation. Popliteal Vein: No evidence of thrombus. Normal compressibility, respiratory phasicity and response to augmentation. Calf Veins: No evidence of thrombus. Normal compressibility and flow on color Doppler imaging. Superficial Great Saphenous Vein: No evidence of thrombus. Normal compressibility. Venous Reflux:  None. Other Findings: Reversed venous flow is seen within the distal posterior tibial vein, as per the ultrasound technologist. A varicose vein is present within the lateral aspect of the right calf. A 3.6 mm x 0.7 mm x 4.2 mm cystic appearing area is seen within the soft tissues of the lateral right calf. No abnormal flow is seen within this region on color Doppler evaluation. IMPRESSION: No evidence of deep venous thrombosis within the RIGHT lower extremity. Electronically Signed   By: Suzen Dials M.D.   On: 01/30/2024 19:26     Procedures   Medications Ordered in the ED - No data to display                                  Medical Decision Making Amount and/or Complexity of Data Reviewed Labs: ordered.   Patient presents as outlined.  Describes symptoms suggestive of  myositis with  bilateral upper leg myalgias last week.  Patient had DVT study done last week that was negative.  There is no swelling or erythema of the lower legs today.  DVT studies done through triage are negative.  At this time no signs of DVT.  Will obtain a CK to rule out myositis and CBC for any platelet dysfunction or hematological abnormality.  Platelets are normal.  CK mildly elevated at 239.  The bruise on the patient's leg looks consistent with a minor traumatic bruise.  Evidence advised patient to continue observe this and I would anticipate resolution.  I also discussed mild CK elevation.  At this time this appears less likely to be any significant autoimmune related of myositis given relatively low value and resolving symptoms.  I counseled the patient on having her PCP monitor her CK to make sure it goes back to normal with hydration and rest.  Patient is clinically well in appearance and otherwise negative review of systems stable for discharge and outpatient follow-up.     Final diagnoses:  Right leg pain  Bruise  Myalgia    ED Discharge Orders     None          Armenta Canning, MD 01/30/24 2158
